# Patient Record
Sex: Female | Born: 1935
Health system: Southern US, Community
[De-identification: ages and names within clinical notes are randomized; demographics above are authoritative.]

## PROBLEM LIST (undated history)

## (undated) DIAGNOSIS — I10 Essential (primary) hypertension: Secondary | ICD-10-CM

## (undated) DIAGNOSIS — N390 Urinary tract infection, site not specified: Secondary | ICD-10-CM

## (undated) DIAGNOSIS — R350 Frequency of micturition: Secondary | ICD-10-CM

## (undated) DIAGNOSIS — R3915 Urgency of urination: Secondary | ICD-10-CM

## (undated) DIAGNOSIS — G309 Alzheimer's disease, unspecified: Secondary | ICD-10-CM

## (undated) DIAGNOSIS — B029 Zoster without complications: Secondary | ICD-10-CM

## (undated) DIAGNOSIS — K4090 Unilateral inguinal hernia, without obstruction or gangrene, not specified as recurrent: Secondary | ICD-10-CM

## (undated) DIAGNOSIS — Z973 Presence of spectacles and contact lenses: Secondary | ICD-10-CM

## (undated) DIAGNOSIS — F0280 Dementia in other diseases classified elsewhere without behavioral disturbance: Secondary | ICD-10-CM

## (undated) DIAGNOSIS — F419 Anxiety disorder, unspecified: Secondary | ICD-10-CM

## (undated) DIAGNOSIS — M1990 Unspecified osteoarthritis, unspecified site: Secondary | ICD-10-CM

## (undated) HISTORY — DX: Alzheimer's disease, unspecified: G30.9

## (undated) HISTORY — DX: Anxiety disorder, unspecified: F41.9

## (undated) HISTORY — PX: BUNIONECTOMY: SHX129

## (undated) HISTORY — DX: Dementia in other diseases classified elsewhere without behavioral disturbance: F02.80

---

## 2001-03-09 ENCOUNTER — Other Ambulatory Visit: Admission: RE | Admit: 2001-03-09 | Discharge: 2001-03-09 | Payer: Self-pay | Admitting: Internal Medicine

## 2003-06-27 ENCOUNTER — Other Ambulatory Visit: Admission: RE | Admit: 2003-06-27 | Discharge: 2003-06-27 | Payer: Self-pay | Admitting: Internal Medicine

## 2006-06-09 ENCOUNTER — Other Ambulatory Visit: Admission: RE | Admit: 2006-06-09 | Discharge: 2006-06-09 | Payer: Self-pay | Admitting: Internal Medicine

## 2008-09-29 ENCOUNTER — Emergency Department (HOSPITAL_COMMUNITY): Admission: EM | Admit: 2008-09-29 | Discharge: 2008-09-29 | Payer: Self-pay | Admitting: Family Medicine

## 2011-08-30 LAB — POCT URINALYSIS DIP (DEVICE)
Bilirubin Urine: NEGATIVE
Nitrite: NEGATIVE
Operator id: 20094
Protein, ur: NEGATIVE
Specific Gravity, Urine: 1.01
pH: 7

## 2013-04-28 HISTORY — PX: EYE SURGERY: SHX253

## 2013-07-03 ENCOUNTER — Other Ambulatory Visit: Payer: Self-pay | Admitting: Orthopaedic Surgery

## 2013-07-03 DIAGNOSIS — R937 Abnormal findings on diagnostic imaging of other parts of musculoskeletal system: Secondary | ICD-10-CM

## 2013-07-08 ENCOUNTER — Ambulatory Visit
Admission: RE | Admit: 2013-07-08 | Discharge: 2013-07-08 | Disposition: A | Discharge status: Home / Self Care | Payer: Medicare Other | Source: Ambulatory Visit | Attending: Orthopaedic Surgery | Admitting: Orthopaedic Surgery

## 2013-07-08 DIAGNOSIS — R937 Abnormal findings on diagnostic imaging of other parts of musculoskeletal system: Secondary | ICD-10-CM

## 2013-10-03 ENCOUNTER — Encounter (HOSPITAL_COMMUNITY): Payer: Self-pay | Admitting: Pharmacy Technician

## 2013-10-04 ENCOUNTER — Other Ambulatory Visit (HOSPITAL_COMMUNITY): Payer: Self-pay | Admitting: Orthopaedic Surgery

## 2013-10-10 ENCOUNTER — Ambulatory Visit (HOSPITAL_COMMUNITY)
Admission: RE | Admit: 2013-10-10 | Discharge: 2013-10-10 | Disposition: A | Discharge status: Home / Self Care | Payer: Medicare Other | Source: Ambulatory Visit | Attending: Orthopaedic Surgery | Admitting: Orthopaedic Surgery

## 2013-10-10 ENCOUNTER — Encounter (HOSPITAL_COMMUNITY): Payer: Self-pay

## 2013-10-10 ENCOUNTER — Encounter (HOSPITAL_COMMUNITY)
Admission: RE | Admit: 2013-10-10 | Discharge: 2013-10-10 | Disposition: A | Discharge status: Home / Self Care | Payer: Medicare Other | Source: Ambulatory Visit | Attending: Orthopaedic Surgery | Admitting: Orthopaedic Surgery

## 2013-10-10 DIAGNOSIS — Z01818 Encounter for other preprocedural examination: Secondary | ICD-10-CM | POA: Insufficient documentation

## 2013-10-10 DIAGNOSIS — I10 Essential (primary) hypertension: Secondary | ICD-10-CM | POA: Insufficient documentation

## 2013-10-10 DIAGNOSIS — I517 Cardiomegaly: Secondary | ICD-10-CM | POA: Insufficient documentation

## 2013-10-10 HISTORY — DX: Essential (primary) hypertension: I10

## 2013-10-10 HISTORY — DX: Urinary tract infection, site not specified: N39.0

## 2013-10-10 HISTORY — DX: Frequency of micturition: R35.0

## 2013-10-10 HISTORY — DX: Unspecified osteoarthritis, unspecified site: M19.90

## 2013-10-10 HISTORY — DX: Urgency of urination: R39.15

## 2013-10-10 HISTORY — DX: Zoster without complications: B02.9

## 2013-10-10 LAB — COMPREHENSIVE METABOLIC PANEL
BUN: 11 mg/dL (ref 6–23)
CO2: 26 mEq/L (ref 19–32)
Creatinine, Ser: 0.49 mg/dL — ABNORMAL LOW (ref 0.50–1.10)
GFR calc non Af Amer: >90 mL/min (ref >90)

## 2013-10-10 LAB — CBC
Hemoglobin: 15.1 g/dL — ABNORMAL HIGH (ref 12.0–15.0)
MCHC: 36 g/dL (ref 30.0–36.0)
MCV: 97.2 fL (ref 78.0–100.0)
Platelets: 267 10*3/uL (ref 150–400)

## 2013-10-10 NOTE — Progress Notes (Signed)
Cardiologist is Dr. Scarlette Calico with last visit 5-6 years ago, released unless needs to be seen.  ECHO done 6 years ago.  Pt reports being seen by Everardo Beals, NP at [facility].  Pt denies CXR in the last year and cannot remember where she had EKG done about 6 months ago prior to cataract surgery.

## 2013-10-10 NOTE — Pre-Procedure Instructions (Signed)
JONIE BURDELL  10/10/2013   Your procedure is scheduled on:  Mon, Nov 17 @ 2:57 PM  Report to [facility] Short Stay Entrance A at 1:00 PM.  Call this number if you have problems the morning of surgery: [phone]   Remember:   Do not eat food or drink liquids after midnight.   Take these medicines the morning of surgery with A SIP OF WATER: Eye Drops               Stop taking your Fish Oil and Aspirin.No Goody's,BC's,Aleve,Ibuprofen,or any Herbal Medications   Do not wear jewelry, make-up or nail polish.  Do not wear lotions, powders, or perfumes. You may wear deodorant.  Do not shave 48 hours prior to surgery.   Do not bring valuables to the hospital.  [facility] is not responsible                  for any belongings or valuables.               Contacts, dentures or bridgework may not be worn into surgery.  Leave suitcase in the car. After surgery it may be brought to your room.  For patients admitted to the hospital, discharge time is determined by your                treatment team.               Patients discharged the day of surgery will not be allowed to drive  home.    Special Instructions: Shower using CHG 2 nights before surgery and the night before surgery.  If you shower the day of surgery use CHG.  Use special wash - you have one bottle of CHG for all showers.  You should use approximately 1/3 of the bottle for each shower.   Please read over the following fact sheets that you were given: Pain Booklet, Coughing and Deep Breathing, MRSA Information and Surgical Site Infection Prevention

## 2013-10-11 ENCOUNTER — Encounter (HOSPITAL_COMMUNITY): Payer: Self-pay

## 2013-10-11 NOTE — H&P (Signed)
  [facility]   A Division of [employer]   [address], [place], [place] [address] Telephone: [phone]  Fax: [phone]     PATIENT: Alicia [name], Alicia [name]   MR#: [id number]  DOB: 24-Jul-1936       HISTORY OF PRESENT ILLNESS:  A 77 year old female referred by Dr. Ernestina Patches for severe spinal stenosis of the L4-5 level with grade 1 anterolisthesis.  She has had ongoing progressing pain, had epidurals with only temporary relief.  She has worse right than left leg pain.  She can only lay on her back at night and at the end of the day she is having significant increasing pain and has had MRI scan in May that showed severe central stenosis with facet arthropathy and synovial cyst coming off the right anterior facet joint which was intraspinal and extradural contributing to the stenosis.  Her stenosis is multifactorial.  She has some severe facet changes at L5-S1 with patent foramina and lateral recess.  The patient is using vitamins and Motrin.  She is normally followed by [facility].     PAST SURGICAL HISTORY:  She had previous hammer toes, bunion surgery in 2011.   SOCIAL HISTORY:  She is married to her husband [name] who is with her today.  She retired last year.   FAMILY HISTORY:  Positive for heart disease.   REVIEW OF SYSTEMS:  14-point review of systems negative for rheumatologic disease, cardiac history.   Epidural's have been done in June and also July without sustained relief.     RADIOGRAPHS/TEST:  We reviewed the MRI scan with her and discussed severe stenosis.  Flexion/extension x-rays, lumbar lateral demonstrates a 6.4 mm of anterolisthesis with degenerative facet.  She has intact pars.  There is mild lumbar curvature less than 15 degrees.  Since she is not subluxing more than 3 mm we discussed that she would be a candidate for decompression, removal of the extradural intraspinal cyst with biforaminal decompression.       ASSESSMENT/DIAGNOSIS:  Central decompression at L4-5.  Overnight stay in the hospital, risk of dural tear, reoperation, potential for progression of the anterolisthesis which might require later fusion discussed.     PLAN:  All questions answered.  She understands and requests we proceed.   For additional information please see handwritten notes, reports, orders and prescriptions in this chart.      Mark C. Lorin Mercy, M.D.    Auto-Authenticated by Thana Farr. Lorin Mercy, M.D.

## 2013-10-11 NOTE — Progress Notes (Signed)
Anesthesia Chart Review:  Patient is a L4-5 decompression, excision extradural intraspinal cyst on 10/14/13 by Dr. Lorin Mercy.  History includes non-smoker, frequent UTI, HTN, arthritis, left cataract extraction 04/2013. PCP is listed as Everardo Beals, NP [facility].  She was evaluated by cardiologist Dr. Irish Lack 5-6 years ago, but was reportedly released with PRN cardiology follow-up.  Meds: ASA, Calcium, Vitamin D, Vitamin B12, lisinopril, Magnesium, Fish Oil, Refresh gtts.  EKG on 10/10/13 showed NSR, non-specific ST abnormality.   CXR on 10/10/13 showed borderline cardiomegaly, without acute disease.  Preoperative labs noted.  Na 127, Cl 89, Cr 0.49, H/H 15.1/41.9, PT/INR WNL.  Glucose 94.  I will order an ISTAT on the day of surgery to ensure Na is stable and/or improved preoperatively.  She is not on diuretic therapy.  George Hugh [facility] Short Stay Center/Anesthesiology Phone [phone] 10/11/2013 10:57 AM

## 2013-10-13 MED ORDER — CEFAZOLIN SODIUM-DEXTROSE 2-3 GM-% IV SOLR: 2.0000 g | INTRAVENOUS | Status: DC

## 2013-10-14 ENCOUNTER — Inpatient Hospital Stay (HOSPITAL_COMMUNITY): Payer: Medicare Other | Admitting: Certified Registered"

## 2013-10-14 ENCOUNTER — Inpatient Hospital Stay (HOSPITAL_COMMUNITY)
Admission: RE | Admit: 2013-10-14 | Discharge: 2013-10-15 | DRG: 520 | Disposition: A | Discharge status: Home / Self Care | Payer: Medicare Other | Source: Ambulatory Visit | Attending: Orthopaedic Surgery | Admitting: Orthopaedic Surgery

## 2013-10-14 ENCOUNTER — Encounter (HOSPITAL_COMMUNITY): Payer: Medicare Other | Admitting: Vascular Surgery

## 2013-10-14 ENCOUNTER — Encounter (HOSPITAL_COMMUNITY)
Admission: RE | Disposition: A | Discharge status: Home / Self Care | Payer: Self-pay | Source: Ambulatory Visit | Attending: Orthopaedic Surgery

## 2013-10-14 ENCOUNTER — Inpatient Hospital Stay (HOSPITAL_COMMUNITY): Payer: Medicare Other

## 2013-10-14 ENCOUNTER — Encounter (HOSPITAL_COMMUNITY): Payer: Self-pay | Admitting: *Deleted

## 2013-10-14 DIAGNOSIS — Q762 Congenital spondylolisthesis: Secondary | ICD-10-CM

## 2013-10-14 DIAGNOSIS — M48062 Spinal stenosis, lumbar region with neurogenic claudication: Secondary | ICD-10-CM | POA: Diagnosis present

## 2013-10-14 DIAGNOSIS — I10 Essential (primary) hypertension: Secondary | ICD-10-CM | POA: Diagnosis present

## 2013-10-14 DIAGNOSIS — M7130 Other bursal cyst, unspecified site: Principal | ICD-10-CM | POA: Diagnosis present

## 2013-10-14 DIAGNOSIS — Z8249 Family history of ischemic heart disease and other diseases of the circulatory system: Secondary | ICD-10-CM

## 2013-10-14 HISTORY — PX: LUMBAR LAMINECTOMY: SHX95

## 2013-10-14 LAB — POCT I-STAT 4, (NA,K, GLUC, HGB,HCT)
Glucose, Bld: 102 mg/dL — ABNORMAL HIGH (ref 70–99)
HCT: 41 % (ref 36.0–46.0)
Potassium: 4 mEq/L (ref 3.5–5.1)

## 2013-10-14 SURGERY — MICRODISCECTOMY LUMBAR LAMINECTOMY
Anesthesia: General | Site: Back | Wound class: Clean

## 2013-10-14 MED ORDER — SODIUM CHLORIDE 0.9 % IJ SOLN
3.0000 mL | Freq: Two times a day (BID) | INTRAMUSCULAR | Status: DC
  Administered 2013-10-15: 3 mL via INTRAVENOUS

## 2013-10-14 MED ORDER — ROCURONIUM BROMIDE 100 MG/10ML IV SOLN
INTRAVENOUS | Status: DC | PRN
  Administered 2013-10-14: 50 mg via INTRAVENOUS

## 2013-10-14 MED ORDER — ACETAMINOPHEN 325 MG PO TABS: 650.0000 mg | ORAL_TABLET | ORAL | Status: DC | PRN

## 2013-10-14 MED ORDER — PROPOFOL 10 MG/ML IV BOLUS
INTRAVENOUS | Status: DC | PRN
  Administered 2013-10-14: 200 mg via INTRAVENOUS

## 2013-10-14 MED ORDER — KCL IN DEXTROSE-NACL 20-5-0.45 MEQ/L-%-% IV SOLN
INTRAVENOUS | Status: DC
  Filled 2013-10-14 (×3): qty 1000

## 2013-10-14 MED ORDER — CEFAZOLIN SODIUM 1-5 GM-% IV SOLN
1.0000 g | Freq: Once | INTRAVENOUS | Status: AC
  Administered 2013-10-14: 1 g via INTRAVENOUS
  Filled 2013-10-14: qty 50

## 2013-10-14 MED ORDER — POLYVINYL ALCOHOL 1.4 % OP SOLN
1.0000 [drp] | Freq: Every day | OPHTHALMIC | Status: DC
  Filled 2013-10-14 (×2): qty 15

## 2013-10-14 MED ORDER — LACTATED RINGERS IV SOLN
INTRAVENOUS | Status: DC
  Administered 2013-10-14: 13:00:00 via INTRAVENOUS

## 2013-10-14 MED ORDER — PHENOL 1.4 % MT LIQD: 1.0000 | OROMUCOSAL | Status: DC | PRN

## 2013-10-14 MED ORDER — METHOCARBAMOL 100 MG/ML IJ SOLN
500.0000 mg | Freq: Four times a day (QID) | INTRAVENOUS | Status: DC | PRN
  Filled 2013-10-14: qty 5

## 2013-10-14 MED ORDER — DEXAMETHASONE SODIUM PHOSPHATE 10 MG/ML IJ SOLN
INTRAMUSCULAR | Status: DC | PRN
  Administered 2013-10-14: 4 mg via INTRAVENOUS

## 2013-10-14 MED ORDER — CARBOXYMETHYLCELLULOSE SODIUM 1 % OP SOLN: 1.0000 [drp] | Freq: Every day | OPHTHALMIC | Status: DC

## 2013-10-14 MED ORDER — SODIUM CHLORIDE 0.9 % IV SOLN: 250.0000 mL | INTRAVENOUS | Status: DC

## 2013-10-14 MED ORDER — OXYCODONE-ACETAMINOPHEN 5-325 MG PO TABS
1.0000 | ORAL_TABLET | ORAL | Status: DC | PRN
  Administered 2013-10-15: 1 via ORAL
  Administered 2013-10-15: 2 via ORAL
  Filled 2013-10-14: qty 2
  Filled 2013-10-14: qty 1

## 2013-10-14 MED ORDER — MENTHOL 3 MG MT LOZG: 1.0000 | LOZENGE | OROMUCOSAL | Status: DC | PRN

## 2013-10-14 MED ORDER — KETOROLAC TROMETHAMINE 30 MG/ML IJ SOLN
30.0000 mg | Freq: Once | INTRAMUSCULAR | Status: AC
  Administered 2013-10-14: 30 mg via INTRAVENOUS
  Filled 2013-10-14: qty 1

## 2013-10-14 MED ORDER — ASPIRIN EC 81 MG PO TBEC
81.0000 mg | DELAYED_RELEASE_TABLET | Freq: Every day | ORAL | Status: DC
  Administered 2013-10-14 – 2013-10-15 (×2): 81 mg via ORAL
  Filled 2013-10-14 (×2): qty 1

## 2013-10-14 MED ORDER — ONDANSETRON HCL 4 MG/2ML IJ SOLN
INTRAMUSCULAR | Status: DC | PRN
  Administered 2013-10-14: 4 mg via INTRAVENOUS

## 2013-10-14 MED ORDER — ZOLPIDEM TARTRATE 5 MG PO TABS: 5.0000 mg | ORAL_TABLET | Freq: Every evening | ORAL | Status: DC | PRN

## 2013-10-14 MED ORDER — LISINOPRIL 20 MG PO TABS
20.0000 mg | ORAL_TABLET | Freq: Every day | ORAL | Status: DC
  Administered 2013-10-14 – 2013-10-15 (×2): 20 mg via ORAL
  Filled 2013-10-14 (×2): qty 1

## 2013-10-14 MED ORDER — MENTHOL 3 MG MT LOZG
LOZENGE | OROMUCOSAL | Status: AC
  Filled 2013-10-14: qty 9

## 2013-10-14 MED ORDER — ONDANSETRON HCL 4 MG/2ML IJ SOLN: 4.0000 mg | INTRAMUSCULAR | Status: DC | PRN

## 2013-10-14 MED ORDER — CEFAZOLIN SODIUM-DEXTROSE 2-3 GM-% IV SOLR
INTRAVENOUS | Status: AC
  Administered 2013-10-14: 2 g via INTRAVENOUS
  Filled 2013-10-14: qty 50

## 2013-10-14 MED ORDER — LIDOCAINE HCL (CARDIAC) 20 MG/ML IV SOLN
INTRAVENOUS | Status: DC | PRN
  Administered 2013-10-14: 60 mg via INTRAVENOUS

## 2013-10-14 MED ORDER — FENTANYL CITRATE 0.05 MG/ML IJ SOLN
INTRAMUSCULAR | Status: DC | PRN
  Administered 2013-10-14 (×6): 50 ug via INTRAVENOUS

## 2013-10-14 MED ORDER — NEOSTIGMINE METHYLSULFATE 1 MG/ML IJ SOLN
INTRAMUSCULAR | Status: DC | PRN
  Administered 2013-10-14: 4 mg via INTRAVENOUS

## 2013-10-14 MED ORDER — PANTOPRAZOLE SODIUM 40 MG IV SOLR
40.0000 mg | Freq: Every day | INTRAVENOUS | Status: DC
  Administered 2013-10-14: 40 mg via INTRAVENOUS
  Filled 2013-10-14 (×3): qty 40

## 2013-10-14 MED ORDER — PHENYLEPHRINE HCL 10 MG/ML IJ SOLN
10.0000 mg | INTRAVENOUS | Status: DC | PRN
  Administered 2013-10-14: 25 ug/min via INTRAVENOUS

## 2013-10-14 MED ORDER — 0.9 % SODIUM CHLORIDE (POUR BTL) OPTIME
TOPICAL | Status: DC | PRN
  Administered 2013-10-14: 1000 mL

## 2013-10-14 MED ORDER — PHENYLEPHRINE HCL 10 MG/ML IJ SOLN
INTRAMUSCULAR | Status: DC | PRN
  Administered 2013-10-14 (×2): 40 ug via INTRAVENOUS

## 2013-10-14 MED ORDER — KETOROLAC TROMETHAMINE 30 MG/ML IJ SOLN: 15.0000 mg | Freq: Once | INTRAMUSCULAR | Status: DC | PRN

## 2013-10-14 MED ORDER — FLEET ENEMA 7-19 GM/118ML RE ENEM: 1.0000 | ENEMA | Freq: Once | RECTAL | Status: AC | PRN

## 2013-10-14 MED ORDER — DOCUSATE SODIUM 100 MG PO CAPS
100.0000 mg | ORAL_CAPSULE | Freq: Two times a day (BID) | ORAL | Status: DC
  Administered 2013-10-14 – 2013-10-15 (×2): 100 mg via ORAL
  Filled 2013-10-14 (×3): qty 1

## 2013-10-14 MED ORDER — SODIUM CHLORIDE 0.9 % IJ SOLN: 3.0000 mL | INTRAMUSCULAR | Status: DC | PRN

## 2013-10-14 MED ORDER — HYDROCODONE-ACETAMINOPHEN 5-325 MG PO TABS: 1.0000 | ORAL_TABLET | ORAL | Status: DC | PRN

## 2013-10-14 MED ORDER — ACETAMINOPHEN 650 MG RE SUPP: 650.0000 mg | RECTAL | Status: DC | PRN

## 2013-10-14 MED ORDER — BISACODYL 10 MG RE SUPP: 10.0000 mg | Freq: Every day | RECTAL | Status: DC | PRN

## 2013-10-14 MED ORDER — SENNOSIDES-DOCUSATE SODIUM 8.6-50 MG PO TABS: 1.0000 | ORAL_TABLET | Freq: Every evening | ORAL | Status: DC | PRN

## 2013-10-14 MED ORDER — GLYCOPYRROLATE 0.2 MG/ML IJ SOLN
INTRAMUSCULAR | Status: DC | PRN
  Administered 2013-10-14: 0.6 mg via INTRAVENOUS

## 2013-10-14 MED ORDER — METHOCARBAMOL 500 MG PO TABS: 500.0000 mg | ORAL_TABLET | Freq: Four times a day (QID) | ORAL | Status: DC | PRN

## 2013-10-14 MED ORDER — ONDANSETRON HCL 4 MG/2ML IJ SOLN: 4.0000 mg | Freq: Once | INTRAMUSCULAR | Status: DC | PRN

## 2013-10-14 MED ORDER — BUPIVACAINE HCL (PF) 0.25 % IJ SOLN
INTRAMUSCULAR | Status: AC
  Filled 2013-10-14: qty 30

## 2013-10-14 MED ORDER — HYDROMORPHONE HCL PF 1 MG/ML IJ SOLN: 0.2500 mg | INTRAMUSCULAR | Status: DC | PRN

## 2013-10-14 MED ORDER — MIDAZOLAM HCL 5 MG/5ML IJ SOLN
INTRAMUSCULAR | Status: DC | PRN
  Administered 2013-10-14: 2 mg via INTRAVENOUS

## 2013-10-14 MED ORDER — MORPHINE SULFATE 2 MG/ML IJ SOLN: 1.0000 mg | INTRAMUSCULAR | Status: DC | PRN

## 2013-10-14 MED ORDER — LACTATED RINGERS IV SOLN
INTRAVENOUS | Status: DC | PRN
  Administered 2013-10-14 (×2): via INTRAVENOUS

## 2013-10-14 SURGICAL SUPPLY — 54 items
BUR ROUND FLUTED 4 SOFT TCH (BURR) IMPLANT
CLOTH BEACON ORANGE TIMEOUT ST (SAFETY) ×2 IMPLANT
CORDS BIPOLAR (ELECTRODE) ×2 IMPLANT
COVER SURGICAL LIGHT HANDLE (MISCELLANEOUS) ×2 IMPLANT
DERMABOND ADVANCED (GAUZE/BANDAGES/DRESSINGS) ×1
DERMABOND ADVANCED .7 DNX12 (GAUZE/BANDAGES/DRESSINGS) ×1 IMPLANT
DRAPE MICROSCOPE LEICA (MISCELLANEOUS) ×2 IMPLANT
DRAPE PROXIMA HALF (DRAPES) ×4 IMPLANT
DRSG EMULSION OIL 3X3 NADH (GAUZE/BANDAGES/DRESSINGS) ×2 IMPLANT
DRSG MEPILEX BORDER 4X4 (GAUZE/BANDAGES/DRESSINGS) ×2 IMPLANT
DRSG MEPILEX BORDER 4X8 (GAUZE/BANDAGES/DRESSINGS) IMPLANT
DURAPREP 26ML APPLICATOR (WOUND CARE) ×2 IMPLANT
ELECT BLADE 4.0 EZ CLEAN MEGAD (MISCELLANEOUS) ×2
ELECT REM PT RETURN 9FT ADLT (ELECTROSURGICAL) ×2
ELECTRODE BLDE 4.0 EZ CLN MEGD (MISCELLANEOUS) ×1 IMPLANT
ELECTRODE REM PT RTRN 9FT ADLT (ELECTROSURGICAL) ×1 IMPLANT
GLOVE BIO SURGEON STRL SZ7 (GLOVE) ×4 IMPLANT
GLOVE BIOGEL PI IND STRL 7.0 (GLOVE) ×2 IMPLANT
GLOVE BIOGEL PI IND STRL 7.5 (GLOVE) ×1 IMPLANT
GLOVE BIOGEL PI IND STRL 8 (GLOVE) ×1 IMPLANT
GLOVE BIOGEL PI INDICATOR 7.0 (GLOVE) ×2
GLOVE BIOGEL PI INDICATOR 7.5 (GLOVE) ×1
GLOVE BIOGEL PI INDICATOR 8 (GLOVE) ×1
GLOVE ECLIPSE 7.0 STRL STRAW (GLOVE) ×2 IMPLANT
GLOVE ORTHO TXT STRL SZ7.5 (GLOVE) ×2 IMPLANT
GOWN PREVENTION PLUS LG XLONG (DISPOSABLE) ×2 IMPLANT
GOWN STRL NON-REIN LRG LVL3 (GOWN DISPOSABLE) IMPLANT
GOWN STRL REIN XL XLG (GOWN DISPOSABLE) ×4 IMPLANT
KIT BASIN OR (CUSTOM PROCEDURE TRAY) ×2 IMPLANT
KIT ROOM TURNOVER OR (KITS) ×2 IMPLANT
MANIFOLD NEPTUNE II (INSTRUMENTS) ×2 IMPLANT
NDL SUT .5 MAYO 1.404X.05X (NEEDLE) ×1 IMPLANT
NEEDLE 22X1 1/2 (OR ONLY) (NEEDLE) ×2 IMPLANT
NEEDLE MAYO TAPER (NEEDLE) ×1
NEEDLE SPNL 18GX3.5 QUINCKE PK (NEEDLE) ×2 IMPLANT
NS IRRIG 1000ML POUR BTL (IV SOLUTION) ×2 IMPLANT
PACK LAMINECTOMY ORTHO (CUSTOM PROCEDURE TRAY) ×2 IMPLANT
PAD ARMBOARD 7.5X6 YLW CONV (MISCELLANEOUS) ×4 IMPLANT
PATTIES SURGICAL .5 X.5 (GAUZE/BANDAGES/DRESSINGS) ×2 IMPLANT
PATTIES SURGICAL .75X.75 (GAUZE/BANDAGES/DRESSINGS) ×2 IMPLANT
SPONGE GAUZE 4X4 12PLY (GAUZE/BANDAGES/DRESSINGS) IMPLANT
SUT VIC AB 0 CT1 27 (SUTURE) ×1
SUT VIC AB 0 CT1 27XBRD ANBCTR (SUTURE) ×1 IMPLANT
SUT VIC AB 2-0 CT1 27 (SUTURE) ×1
SUT VIC AB 2-0 CT1 TAPERPNT 27 (SUTURE) ×1 IMPLANT
SUT VICRYL 0 TIES 12 18 (SUTURE) ×2 IMPLANT
SUT VICRYL 0 UR6 27IN ABS (SUTURE) ×2 IMPLANT
SUT VICRYL 4-0 PS2 18IN ABS (SUTURE) ×2 IMPLANT
SUT VICRYL AB 2 0 TIES (SUTURE) ×2 IMPLANT
SYR 20ML ECCENTRIC (SYRINGE) IMPLANT
SYR CONTROL 10ML LL (SYRINGE) ×2 IMPLANT
TOWEL OR 17X24 6PK STRL BLUE (TOWEL DISPOSABLE) ×2 IMPLANT
TOWEL OR 17X26 10 PK STRL BLUE (TOWEL DISPOSABLE) ×2 IMPLANT
WATER STERILE IRR 1000ML POUR (IV SOLUTION) IMPLANT

## 2013-10-14 NOTE — Anesthesia Procedure Notes (Addendum)
Procedure Name: Intubation Date/Time: 10/14/2013 3:10 PM Performed by: Maeola Harman Pre-anesthesia Checklist: Patient identified, Emergency Drugs available, Suction available, Patient being monitored and Timeout performed Patient Re-evaluated:Patient Re-evaluated prior to inductionOxygen Delivery Method: Circle system utilized Preoxygenation: Pre-oxygenation with 100% oxygen Intubation Type: IV induction and Cricoid Pressure applied Ventilation: Mask ventilation without difficulty and Oral airway inserted - appropriate to patient size Laryngoscope Size: Sabra Heck and 2 Grade View: Grade III Tube type: Oral Tube size: 7.5 mm Number of attempts: 2 Airway Equipment and Method: Stylet Placement Confirmation: ETT inserted through vocal cords under direct vision,  positive ETCO2 and breath sounds checked- equal and bilateral Secured at: 22 cm Tube secured with: Tape Dental Injury: Teeth and Oropharynx as per pre-operative assessment  Comments: Easy induction and mask with oral airway in place.  DL attempted x 1 with MAC 3 blade by paramedic student Shawn under the direction of Dr. Linna Caprice with view of only the epiglottis.  DLx 1 by Dr. Linna Caprice with Sabra Heck 2 blade.  ETT passed easily through cords and Dr. Linna Caprice verified placement.  Waldron Session, CRNA

## 2013-10-14 NOTE — Transfer of Care (Signed)
Immediate Anesthesia Transfer of Care Note  Patient: Alicia [name]  Procedure(s) Performed: Procedure(s) with comments: MICRODISCECTOMY LUMBAR LAMINECTOMY/L4-5 Decompression, Excision Extradural Intraspinal Cyst (N/A) - L4-5 Decompression, Excision Extradural Intraspinal Cyst  Patient Location: PACU  Anesthesia Type:General  Level of Consciousness: awake, alert  and oriented  Airway & Oxygen Therapy: Patient connected to [name] mask oxygen  Post-op Assessment: Report given to PACU RN  Post vital signs: stable  Complications: No apparent anesthesia complications

## 2013-10-14 NOTE — Interval H&P Note (Signed)
History and Physical Interval Note:  10/14/2013 2:34 PM  Alicia [name]  has presented today for surgery, with the diagnosis of Severe L4-5 Stenosis, Intrapinal extradural cyst  The various methods of treatment have been discussed with the patient and family. After consideration of risks, benefits and other options for treatment, the patient has consented to  Procedure(s) with comments: MICRODISCECTOMY LUMBAR LAMINECTOMY/L4-5 Decompression, Excision Extradural Intraspinal Cyst (N/A) - L4-5 Decompression, Excision Extradural Intraspinal Cyst as a surgical intervention .  The patient's history has been reviewed, patient examined, no change in status, stable for surgery.  I have reviewed the patient's chart and labs.  Questions were answered to the patient's satisfaction.     Perrin Gens C

## 2013-10-14 NOTE — Anesthesia Postprocedure Evaluation (Signed)
  Anesthesia Post-op Note  Patient: Alicia [name]  Procedure(s) Performed: Procedure(s) with comments: MICRODISCECTOMY LUMBAR LAMINECTOMY/L4-5 Decompression, Excision Extradural Intraspinal Cyst (N/A) - L4-5 Decompression, Excision Extradural Intraspinal Cyst  Patient Location: PACU  Anesthesia Type:General  Level of Consciousness: awake and alert   Airway and Oxygen Therapy: Patient Spontanous Breathing  Post-op Pain: none  Post-op Assessment: Post-op Vital signs reviewed, Patient's Cardiovascular Status Stable, Respiratory Function Stable and No signs of Nausea or vomiting  Post-op Vital Signs: Reviewed and stable  Complications: No apparent anesthesia complications

## 2013-10-14 NOTE — Anesthesia Preprocedure Evaluation (Addendum)
Anesthesia Evaluation  Patient identified by MRN, date of birth, ID band Patient awake    Reviewed: Allergy & Precautions, H&P , NPO status , Patient's Chart, lab work & pertinent test results, reviewed documented beta blocker date and time   Airway Mallampati: II TM Distance: >3 FB Neck ROM: Full    Dental  (+) Teeth Intact and Dental Advisory Given   Pulmonary neg pulmonary ROS,  breath sounds clear to auscultation        Cardiovascular hypertension, Pt. on medications Rhythm:Regular Rate:Normal     Neuro/Psych negative neurological ROS  negative psych ROS   GI/Hepatic negative GI ROS, Neg liver ROS,   Endo/Other  negative endocrine ROS  Renal/GU negative Renal ROS  negative genitourinary   Musculoskeletal negative musculoskeletal ROS (+)   Abdominal (+)  Abdomen: soft. Bowel sounds: normal.  Peds  Hematology negative hematology ROS (+)   Anesthesia Other Findings   Reproductive/Obstetrics negative OB ROS                         Anesthesia Physical Anesthesia Plan  ASA: II  Anesthesia Plan: General   Post-op Pain Management:    Induction: Intravenous  Airway Management Planned: Oral ETT  Additional Equipment:   Intra-op Plan:   Post-operative Plan: Extubation in OR  Informed Consent: I have reviewed the patients History and Physical, chart, labs and discussed the procedure including the risks, benefits and alternatives for the proposed anesthesia with the patient or authorized representative who has indicated his/her understanding and acceptance.   Dental advisory given  Plan Discussed with: CRNA and Anesthesiologist  Anesthesia Plan Comments: (L4-5 stenosis Htn  Plan GA with oral ETT  Roberts Gaudy, MD)        Anesthesia Quick Evaluation

## 2013-10-14 NOTE — Anesthesia Postprocedure Evaluation (Signed)
  Anesthesia Post-op Note  Patient: Alicia [name]  Procedure(s) Performed: Procedure(s) with comments: MICRODISCECTOMY LUMBAR LAMINECTOMY/L4-5 Decompression, Excision Extradural Intraspinal Cyst (N/A) - L4-5 Decompression, Excision Extradural Intraspinal Cyst  Patient Location: PACU  Anesthesia Type:General  Level of Consciousness: awake  Airway and Oxygen Therapy: Patient Spontanous Breathing  Post-op Pain: mild  Post-op Assessment: Post-op Vital signs reviewed, Patient's Cardiovascular Status Stable, Respiratory Function Stable, Patent Airway, No signs of Nausea or vomiting and Pain level controlled  Post-op Vital Signs: Reviewed and stable  Complications: No apparent anesthesia complications

## 2013-10-14 NOTE — Brief Op Note (Signed)
10/14/2013  4:49 PM  PATIENT:  Bud Face  77 y.o. female  PRE-OPERATIVE DIAGNOSIS:  Severe L4-5 Stenosis, Intraspinal extradural cyst  POST-OPERATIVE DIAGNOSIS:  Severe L4-5 Stenosis, Intraspinal extradural cyst  PROCEDURE:  Procedure(s) with comments: MICRODISCECTOMY LUMBAR LAMINECTOMY/L4-5 Decompression, Excision Extradural Intraspinal Cyst (N/A) - L4-5 Decompression, Excision Extradural Intraspinal Cyst  SURGEON:  Surgeon(s) and Role:    * Marybelle Killings, MD - Primary  PHYSICIAN ASSISTANT: Phillips Hay [facility]  ASSISTANTS: none   ANESTHESIA:   general  EBL:  Total I/O In: 1000 [I.V.:1000] Out: 100 [Blood:100]  BLOOD ADMINISTERED:none  DRAINS: none   LOCAL MEDICATIONS USED:  NONE  SPECIMEN:  No Specimen  DISPOSITION OF SPECIMEN:  N/A  COUNTS:  YES  TOURNIQUET:  * No tourniquets in log *  DICTATION: .Note written in EPIC  PLAN OF CARE: Admit for overnight observation  PATIENT DISPOSITION:  PACU - hemodynamically stable.   Delay start of Pharmacological VTE agent (>24hrs) due to surgical blood loss or risk of bleeding: yes

## 2013-10-15 MED ORDER — HYDROCODONE-ACETAMINOPHEN 5-325 MG PO TABS: 1.0000 | ORAL_TABLET | ORAL | Status: DC | PRN

## 2013-10-15 NOTE — Progress Notes (Signed)
UR COMPLETED  

## 2013-10-15 NOTE — Progress Notes (Signed)
Subjective: 1 Day Post-Op Procedure(s) (LRB): MICRODISCECTOMY LUMBAR LAMINECTOMY/L4-5 Decompression, Excision Extradural Intraspinal Cyst (N/A) Patient reports pain as mild.  Feels better than pre op Walking in room and leg pain resolved.  Minimal back pain.  Voiding well.  No nausea. Ready to go home  Objective: Vital signs in last 24 hours: Temp:  [97.9 F (36.6 C)-98.3 F (36.8 C)] 97.9 F (36.6 C) (11/18 0552) Pulse Rate:  [59-85] 61 (11/18 0552) Resp:  [11-20] 18 (11/18 0552) BP: (134-170)/(72-107) 134/75 mmHg (11/18 0552) SpO2:  [94 %-100 %] 98 % (11/18 0552)  Intake/Output from previous day: 11/17 0701 - 11/18 0700 In: 2030 [P.O.:120; I.V.:1910] Out: 100 [Blood:100] Intake/Output this shift:     Recent Labs  10/14/13 1321  HGB 13.9    Recent Labs  10/14/13 1321  HCT 41.0    Recent Labs  10/14/13 1321  NA 134*  K 4.0  GLUCOSE 102*   No results found for this basename: LABPT, INR,  in the last 72 hours  Neurovascular intact Dorsiflexion/Plantar flexion intact Incision: no drainage  Assessment/Plan: 1 Day Post-Op Procedure(s) (LRB): MICRODISCECTOMY LUMBAR LAMINECTOMY/L4-5 Decompression, Excision Extradural Intraspinal Cyst (N/A) Advance diet D/C IV fluids Discharge home rx vicoden Ov 2 weeks  Markevion Lattin M 10/15/2013, 11:29 AM

## 2013-10-15 NOTE — Op Note (Signed)
NAMECELESTINE, PRIM NO.:  [id number]  MEDICAL RECORD NO.:  [id number]  LOCATION:  5N10C                        FACILITY:  [facility]  PHYSICIAN:  Caydance Kuehnle C. Lorin Mercy, M.D.    DATE OF BIRTH:  06/30/1936  DATE OF PROCEDURE:  10/14/2013 DATE OF DISCHARGE:                              OPERATIVE REPORT   PREOPERATIVE DIAGNOSES:  L4-5 stenosis with intraspinal extradural facet cyst.  Degenerative anterolisthesis, 6 mm with multifactorial lateral recess and severe central stenosis.  POSTOPERATIVE DIAGNOSES:  L4-5 stenosis with intraspinal extradural facet cyst.  Degenerative anterolisthesis, 6 mm with multifactorial lateral recess and severe central stenosis.  PROCEDURE:  L4-5 decompression, microscopic dissection and removal of extradural intraspinal facet cyst.  SURGEON:  Vesper Trant C. Lorin Mercy, M.D.  ASSISTANT:  Phillips Hay, PA-C, medically necessary and present for the entire procedure.  ANESTHESIA:  General.  ESTIMATED BLOOD LOSS:  100 mL.  COMPLICATIONS:  None.  INDICATIONS:  This 77 year old female has had progressive increasing back and primarily right leg symptoms with claudication to the point where she is only able to walk 25-50 feet, can only stand for 2-3 minutes.  An MRI showed large cyst causing compression in addition to 6 mm anterolisthesis that was stable on flexion, extension, degenerative in nature with intact pars.  PROCEDURE IN DETAIL:  After standard prepping and draping with the patient in prone position,  after intubation preoperative Ancef prophylaxis, time-out procedure, the area squared with towels, Betadine, Steri-Drape applied, and laminectomy sheets and drapes.  Based on palpable landmarks, iliac crest, midline incision was made. Subperiosteal dissection, a  2 Kocher clamps were placed on the planned level of decompression.  Cross-table lateral x-ray confirmed that the L4- 5 interspace was between the 2 clamps as planned but the upper  clamp need to be move cephalad slightly to get the upper portion of the lamina where the cyst extended and there were still some thick chunks of ligament causing multifactorial stenosis.  The deepest blades were applied on the self-retaining retractor.  Lamina was thinned.  Spinous process was removed.  Bur was used to thin the lamina, and then the operative microscope was draped, brought in, laminectomy was performed removing L4 and the top portion of L5 both right and left.  Cyst was encountered.  All areas except where the cyst was present running down the left lateral gutter, chunks of ligament were removed decompressing the dura which allowed a little bit more freedom for the dura and then microdissection techniques, using the D'Errico black nerve hook scalpel and a ball-tipped nerve hooks for gradual progression dissection of the cyst off the dura and then removal.  Bone spurs removed on both gutters. This was inspected, palpated, the foramina was tight, there was slight femoral enlargement.  The cyst was sitting directly over the dorsum of the nerve root compressing it toward the midline and ventrally as it exited out into the foramina.  Once lateral recess was decompressed, cyst was removed.  Dura was carefully inspected, was intact, copious irrigation, then standard layered closure with #1 Vicryl deep layer, 2-0 Vicryl subcutaneous tissue, subcuticular closure.  Dermabond postop dressing and transferred to recovery room.  Instrument count and needle count was  correct.     Chamar Broughton C. Lorin Mercy, M.D.     MCY/MEDQ  D:  10/14/2013  T:  10/15/2013  Job:  [id number]

## 2013-10-16 ENCOUNTER — Encounter (HOSPITAL_COMMUNITY): Payer: Self-pay | Admitting: Orthopaedic Surgery

## 2013-10-21 NOTE — Discharge Summary (Signed)
Physician Discharge Summary  Patient ID: Alicia [name] MRN: [id number] DOB/AGE: October 16, 1936 77 y.o.  Admit date: 10/14/2013 Discharge date: 10/15/2013  Admission Diagnoses:  Spinal stenosis, lumbar region, with neurogenic claudication L4-5.   Discharge Diagnoses:  Principal Problem:   Spinal stenosis, lumbar region, with neurogenic claudication L4-5  Past Medical History  Diagnosis Date  . Frequent UTI     over the last year  . Urinary frequency     over the year  . Urinary urgency   . Arthritis   . Hypertension   . Shingles     Surgeries: Procedure(s): MICRODISCECTOMY LUMBAR LAMINECTOMY/L4-5 Decompression, Excision Extradural Intraspinal Cyst on 10/14/2013   Consultants (if any):  none  Discharged Condition: Improved  Hospital Course: Alicia [name] is an 77 y.o. female who was admitted 10/14/2013 with a diagnosis of Spinal stenosis, lumbar region, with neurogenic claudication and went to the operating room on 10/14/2013 and underwent the above named procedures.    She was given perioperative antibiotics:  Anti-infectives   Start     Dose/Rate Route Frequency Ordered Stop   10/14/13 1900  ceFAZolin (ANCEF) IVPB 1 g/50 mL premix     1 g 100 mL/hr over 30 Minutes Intravenous  Once 10/14/13 1853 10/14/13 2031   10/14/13 1238  ceFAZolin (ANCEF) 2-3 GM-% IVPB SOLR    Comments:  Scronce, Trina   : cabinet override      10/14/13 1238 10/14/13 1510   10/13/13 1049  ceFAZolin (ANCEF) IVPB 2 g/50 mL premix  Status:  Discontinued     2 g 100 mL/hr over 30 Minutes Intravenous On call to O.R. 10/13/13 1049 10/14/13 1853    .  She was given sequential compression devices, early ambulation for DVT prophylaxis.  She benefited maximally from the hospital stay and there were no complications.    Recent vital signs:  Filed Vitals:   10/15/13 0552  BP: 134/75  Pulse: 61  Temp: 97.9 F (36.6 C)  Resp: 18    Recent laboratory studies:  Lab Results  Component Value  Date   HGB 13.9 10/14/2013   HGB 15.1* 10/10/2013   Lab Results  Component Value Date   WBC 6.4 10/10/2013   PLT 267 10/10/2013   Lab Results  Component Value Date   INR 0.96 10/10/2013   Lab Results  Component Value Date   NA 134* 10/14/2013   K 4.0 10/14/2013   CL 89* 10/10/2013   CO2 26 10/10/2013   BUN 11 10/10/2013   CREATININE 0.49* 10/10/2013   GLUCOSE 102* 10/14/2013    Discharge Medications:     Medication List         aspirin EC 81 MG tablet  Take 81 mg by mouth daily.     CALCIUM PO  Take 1 tablet by mouth daily.     FISH OIL PO  Take 1 capsule by mouth daily.     HYDROcodone-acetaminophen 5-325 MG per tablet  Commonly known as:  NORCO/VICODIN  Take 1-2 tablets by mouth every 4 (four) hours as needed for moderate pain.     lisinopril 20 MG tablet  Commonly known as:  PRINIVIL,ZESTRIL  Take 20 mg by mouth daily.     MAGNESIUM PO  Take 1 tablet by mouth daily.     REFRESH OP  Place 1 drop into both eyes daily.     VITAMIN B 12 PO  Take 1 tablet by mouth daily.     VITAMIN D PO  Take 1  tablet by mouth daily.        Diagnostic Studies: Dg Chest 2 View  10/10/2013   CLINICAL DATA:  Preop for spinal surgery.  Hypertension.  Nonsmoker.  EXAM: CHEST  2 VIEW  COMPARISON:  None.  FINDINGS: Suspect left carotid atherosclerosis. Midline trachea. Borderline cardiomegaly. Mediastinal contours otherwise within normal limits. No pleural effusion or pneumothorax. Scarring or subsegmental atelectasis at the left lung base.  IMPRESSION: Borderline cardiomegaly, without acute disease.   Electronically Signed   By: Abigail Miyamoto M.D.   On: 10/10/2013 16:15   Dg Lumbar Spine 1 View  10/14/2013   CLINICAL DATA:  Intraoperative localization for spine surgery.  EXAM: LUMBAR SPINE - 1 VIEW  COMPARISON:  Lumbar spine MRI 04/11/2013.  FINDINGS: Lateral lumbar spine film demonstrates surgical instruments marking the L4 and L5 levels.  IMPRESSION: L4 and L5 marked  intraoperatively.   Electronically Signed   By: Kalman Jewels M.D.   On: 10/14/2013 19:12    Disposition: 01-Home or Self Care      Discharge Orders   Future Orders Complete By Expires   Call MD / Call 911  As directed    Comments:     If you experience chest pain or shortness of breath, CALL 911 and be transported to the hospital emergency room.  If you develope a fever above 101 F, pus (white drainage) or increased drainage or redness at the wound, or calf pain, call your surgeon's office.   Constipation Prevention  As directed    Comments:     Drink plenty of fluids.  Prune juice may be helpful.  You may use a stool softener, such as Colace (over the counter) 100 mg twice a day.  Use MiraLax (over the counter) for constipation as needed.   Diet - low sodium heart healthy  As directed    Discharge instructions  As directed    Comments:     No lifting greater than 10 lbs. Avoid bending, stooping and twisting. Walk in house for first week them may start to get out slowly increasing distance up to one mile by 3 weeks post op. Keep incision dry for 3 days, may use tegaderm or similar water impervious dressing. Ice packs as needed.   Increase activity slowly as tolerated  As directed       Follow-up Information   Follow up with YATES,MARK C, MD. Schedule an appointment as soon as possible for a visit in 2 weeks.   Specialty:  Orthopedic Surgery   Contact information:   [place] [address] [phone]        Signed: Epimenio Foot 10/21/2013, 4:10 PM

## 2017-10-06 ENCOUNTER — Other Ambulatory Visit (HOSPITAL_COMMUNITY)
Admit: 2017-10-06 | Discharge: 2017-10-06 | Disposition: A | Discharge status: Home / Self Care | Payer: Medicare Other | Source: Other Acute Inpatient Hospital | Attending: Internal Medicine | Admitting: Internal Medicine

## 2017-10-06 ENCOUNTER — Other Ambulatory Visit: Payer: Self-pay | Admitting: Internal Medicine

## 2017-10-06 ENCOUNTER — Encounter (HOSPITAL_BASED_OUTPATIENT_CLINIC_OR_DEPARTMENT_OTHER): Payer: Medicare Other | Attending: Internal Medicine

## 2017-10-06 DIAGNOSIS — X58XXXA Exposure to other specified factors, initial encounter: Secondary | ICD-10-CM | POA: Diagnosis not present

## 2017-10-06 DIAGNOSIS — S81801A Unspecified open wound, right lower leg, initial encounter: Secondary | ICD-10-CM | POA: Diagnosis present

## 2017-10-06 DIAGNOSIS — I87311 Chronic venous hypertension (idiopathic) with ulcer of right lower extremity: Secondary | ICD-10-CM | POA: Insufficient documentation

## 2017-10-06 DIAGNOSIS — L97812 Non-pressure chronic ulcer of other part of right lower leg with fat layer exposed: Secondary | ICD-10-CM | POA: Diagnosis not present

## 2017-10-06 DIAGNOSIS — L942 Calcinosis cutis: Secondary | ICD-10-CM | POA: Insufficient documentation

## 2017-10-06 DIAGNOSIS — I10 Essential (primary) hypertension: Secondary | ICD-10-CM | POA: Insufficient documentation

## 2017-10-06 LAB — PHOSPHORUS: Phosphorus: 3.3 mg/dL (ref 2.5–4.6)

## 2017-10-06 LAB — BASIC METABOLIC PANEL
Anion gap: 10 (ref 5–15)
BUN: 8 mg/dL (ref 6–20)
CHLORIDE: 99 mmol/L — AB (ref 101–111)
CO2: 26 mmol/L (ref 22–32)
Calcium: 9.3 mg/dL (ref 8.9–10.3)
Creatinine, Ser: 0.52 mg/dL (ref 0.44–1.00)
GFR calc Af Amer: >60 mL/min (ref >60)
GFR calc non Af Amer: >60 mL/min (ref >60)
Glucose, Bld: 98 mg/dL (ref 65–99)
POTASSIUM: 3.9 mmol/L (ref 3.5–5.1)
Sodium: 135 mmol/L (ref 135–145)

## 2017-10-13 DIAGNOSIS — I87311 Chronic venous hypertension (idiopathic) with ulcer of right lower extremity: Secondary | ICD-10-CM | POA: Diagnosis not present

## 2017-10-27 DIAGNOSIS — I87311 Chronic venous hypertension (idiopathic) with ulcer of right lower extremity: Secondary | ICD-10-CM | POA: Diagnosis not present

## 2017-11-02 ENCOUNTER — Encounter (HOSPITAL_BASED_OUTPATIENT_CLINIC_OR_DEPARTMENT_OTHER): Payer: Medicare Other | Attending: Internal Medicine

## 2017-11-02 DIAGNOSIS — L942 Calcinosis cutis: Secondary | ICD-10-CM | POA: Insufficient documentation

## 2017-11-02 DIAGNOSIS — I87313 Chronic venous hypertension (idiopathic) with ulcer of bilateral lower extremity: Secondary | ICD-10-CM | POA: Diagnosis not present

## 2017-11-02 DIAGNOSIS — X58XXXD Exposure to other specified factors, subsequent encounter: Secondary | ICD-10-CM | POA: Insufficient documentation

## 2017-11-02 DIAGNOSIS — S81811D Laceration without foreign body, right lower leg, subsequent encounter: Secondary | ICD-10-CM | POA: Diagnosis not present

## 2017-11-02 DIAGNOSIS — I10 Essential (primary) hypertension: Secondary | ICD-10-CM | POA: Diagnosis not present

## 2017-11-16 DIAGNOSIS — I87313 Chronic venous hypertension (idiopathic) with ulcer of bilateral lower extremity: Secondary | ICD-10-CM | POA: Diagnosis not present

## 2017-11-30 ENCOUNTER — Encounter (HOSPITAL_BASED_OUTPATIENT_CLINIC_OR_DEPARTMENT_OTHER): Payer: Medicare HMO | Attending: Internal Medicine

## 2017-11-30 DIAGNOSIS — I87311 Chronic venous hypertension (idiopathic) with ulcer of right lower extremity: Secondary | ICD-10-CM | POA: Insufficient documentation

## 2017-11-30 DIAGNOSIS — L942 Calcinosis cutis: Secondary | ICD-10-CM | POA: Insufficient documentation

## 2017-11-30 DIAGNOSIS — L97812 Non-pressure chronic ulcer of other part of right lower leg with fat layer exposed: Secondary | ICD-10-CM | POA: Diagnosis not present

## 2017-11-30 DIAGNOSIS — L97919 Non-pressure chronic ulcer of unspecified part of right lower leg with unspecified severity: Secondary | ICD-10-CM | POA: Diagnosis not present

## 2017-12-14 DIAGNOSIS — L97812 Non-pressure chronic ulcer of other part of right lower leg with fat layer exposed: Secondary | ICD-10-CM | POA: Diagnosis not present

## 2017-12-14 DIAGNOSIS — I87311 Chronic venous hypertension (idiopathic) with ulcer of right lower extremity: Secondary | ICD-10-CM | POA: Diagnosis not present

## 2017-12-14 DIAGNOSIS — L942 Calcinosis cutis: Secondary | ICD-10-CM | POA: Diagnosis not present

## 2017-12-21 DIAGNOSIS — L97812 Non-pressure chronic ulcer of other part of right lower leg with fat layer exposed: Secondary | ICD-10-CM | POA: Diagnosis not present

## 2017-12-21 DIAGNOSIS — L97919 Non-pressure chronic ulcer of unspecified part of right lower leg with unspecified severity: Secondary | ICD-10-CM | POA: Diagnosis not present

## 2017-12-21 DIAGNOSIS — L942 Calcinosis cutis: Secondary | ICD-10-CM | POA: Diagnosis not present

## 2017-12-21 DIAGNOSIS — I87311 Chronic venous hypertension (idiopathic) with ulcer of right lower extremity: Secondary | ICD-10-CM | POA: Diagnosis not present

## 2017-12-28 DIAGNOSIS — L942 Calcinosis cutis: Secondary | ICD-10-CM | POA: Diagnosis not present

## 2017-12-28 DIAGNOSIS — I87311 Chronic venous hypertension (idiopathic) with ulcer of right lower extremity: Secondary | ICD-10-CM | POA: Diagnosis not present

## 2017-12-28 DIAGNOSIS — L97812 Non-pressure chronic ulcer of other part of right lower leg with fat layer exposed: Secondary | ICD-10-CM | POA: Diagnosis not present

## 2018-01-04 ENCOUNTER — Encounter (HOSPITAL_BASED_OUTPATIENT_CLINIC_OR_DEPARTMENT_OTHER): Payer: Medicare HMO | Attending: Internal Medicine

## 2018-01-04 DIAGNOSIS — L97812 Non-pressure chronic ulcer of other part of right lower leg with fat layer exposed: Secondary | ICD-10-CM | POA: Insufficient documentation

## 2018-01-04 DIAGNOSIS — L942 Calcinosis cutis: Secondary | ICD-10-CM | POA: Insufficient documentation

## 2018-01-04 DIAGNOSIS — I87311 Chronic venous hypertension (idiopathic) with ulcer of right lower extremity: Secondary | ICD-10-CM | POA: Diagnosis not present

## 2018-01-04 DIAGNOSIS — L03115 Cellulitis of right lower limb: Secondary | ICD-10-CM | POA: Diagnosis not present

## 2018-01-04 DIAGNOSIS — I10 Essential (primary) hypertension: Secondary | ICD-10-CM | POA: Diagnosis not present

## 2018-01-11 DIAGNOSIS — I10 Essential (primary) hypertension: Secondary | ICD-10-CM | POA: Diagnosis not present

## 2018-01-11 DIAGNOSIS — L942 Calcinosis cutis: Secondary | ICD-10-CM | POA: Diagnosis not present

## 2018-01-11 DIAGNOSIS — L97812 Non-pressure chronic ulcer of other part of right lower leg with fat layer exposed: Secondary | ICD-10-CM | POA: Diagnosis not present

## 2018-01-11 DIAGNOSIS — S81801A Unspecified open wound, right lower leg, initial encounter: Secondary | ICD-10-CM | POA: Diagnosis not present

## 2018-01-11 DIAGNOSIS — L03115 Cellulitis of right lower limb: Secondary | ICD-10-CM | POA: Diagnosis not present

## 2018-01-11 DIAGNOSIS — I87311 Chronic venous hypertension (idiopathic) with ulcer of right lower extremity: Secondary | ICD-10-CM | POA: Diagnosis not present

## 2018-01-18 DIAGNOSIS — S81801A Unspecified open wound, right lower leg, initial encounter: Secondary | ICD-10-CM | POA: Diagnosis not present

## 2018-01-18 DIAGNOSIS — I87311 Chronic venous hypertension (idiopathic) with ulcer of right lower extremity: Secondary | ICD-10-CM | POA: Diagnosis not present

## 2018-01-18 DIAGNOSIS — L03115 Cellulitis of right lower limb: Secondary | ICD-10-CM | POA: Diagnosis not present

## 2018-01-18 DIAGNOSIS — L97812 Non-pressure chronic ulcer of other part of right lower leg with fat layer exposed: Secondary | ICD-10-CM | POA: Diagnosis not present

## 2018-01-18 DIAGNOSIS — L942 Calcinosis cutis: Secondary | ICD-10-CM | POA: Diagnosis not present

## 2018-01-18 DIAGNOSIS — I10 Essential (primary) hypertension: Secondary | ICD-10-CM | POA: Diagnosis not present

## 2018-02-01 ENCOUNTER — Encounter (HOSPITAL_BASED_OUTPATIENT_CLINIC_OR_DEPARTMENT_OTHER): Payer: Medicare HMO | Attending: Internal Medicine

## 2018-02-01 DIAGNOSIS — I10 Essential (primary) hypertension: Secondary | ICD-10-CM | POA: Insufficient documentation

## 2018-02-01 DIAGNOSIS — I87311 Chronic venous hypertension (idiopathic) with ulcer of right lower extremity: Secondary | ICD-10-CM | POA: Insufficient documentation

## 2018-02-01 DIAGNOSIS — S81801A Unspecified open wound, right lower leg, initial encounter: Secondary | ICD-10-CM | POA: Diagnosis not present

## 2018-02-01 DIAGNOSIS — L97812 Non-pressure chronic ulcer of other part of right lower leg with fat layer exposed: Secondary | ICD-10-CM | POA: Diagnosis not present

## 2018-02-01 DIAGNOSIS — L942 Calcinosis cutis: Secondary | ICD-10-CM | POA: Diagnosis not present

## 2018-02-15 DIAGNOSIS — L942 Calcinosis cutis: Secondary | ICD-10-CM | POA: Diagnosis not present

## 2018-02-15 DIAGNOSIS — L97812 Non-pressure chronic ulcer of other part of right lower leg with fat layer exposed: Secondary | ICD-10-CM | POA: Diagnosis not present

## 2018-02-15 DIAGNOSIS — I87311 Chronic venous hypertension (idiopathic) with ulcer of right lower extremity: Secondary | ICD-10-CM | POA: Diagnosis not present

## 2018-02-15 DIAGNOSIS — I10 Essential (primary) hypertension: Secondary | ICD-10-CM | POA: Diagnosis not present

## 2018-03-30 DIAGNOSIS — I10 Essential (primary) hypertension: Secondary | ICD-10-CM | POA: Diagnosis not present

## 2018-03-30 DIAGNOSIS — E782 Mixed hyperlipidemia: Secondary | ICD-10-CM | POA: Diagnosis not present

## 2018-07-25 DIAGNOSIS — R69 Illness, unspecified: Secondary | ICD-10-CM | POA: Diagnosis not present

## 2018-11-06 ENCOUNTER — Other Ambulatory Visit: Payer: Self-pay | Admitting: Nurse Practitioner

## 2018-11-06 DIAGNOSIS — R4789 Other speech disturbances: Secondary | ICD-10-CM | POA: Diagnosis not present

## 2018-11-06 DIAGNOSIS — F0150 Vascular dementia without behavioral disturbance: Secondary | ICD-10-CM

## 2018-11-06 DIAGNOSIS — E7800 Pure hypercholesterolemia, unspecified: Secondary | ICD-10-CM | POA: Diagnosis not present

## 2018-11-06 DIAGNOSIS — I10 Essential (primary) hypertension: Secondary | ICD-10-CM | POA: Diagnosis not present

## 2018-11-06 DIAGNOSIS — R69 Illness, unspecified: Secondary | ICD-10-CM | POA: Diagnosis not present

## 2018-11-09 ENCOUNTER — Ambulatory Visit
Admission: RE | Admit: 2018-11-09 | Discharge: 2018-11-09 | Disposition: A | Discharge status: Home / Self Care | Payer: Medicare HMO | Source: Ambulatory Visit | Attending: Nurse Practitioner | Admitting: Nurse Practitioner

## 2018-11-09 DIAGNOSIS — R413 Other amnesia: Secondary | ICD-10-CM | POA: Diagnosis not present

## 2018-11-09 DIAGNOSIS — F0150 Vascular dementia without behavioral disturbance: Secondary | ICD-10-CM

## 2018-11-26 DIAGNOSIS — R4789 Other speech disturbances: Secondary | ICD-10-CM | POA: Diagnosis not present

## 2018-11-26 DIAGNOSIS — I10 Essential (primary) hypertension: Secondary | ICD-10-CM | POA: Diagnosis not present

## 2018-11-26 DIAGNOSIS — R69 Illness, unspecified: Secondary | ICD-10-CM | POA: Diagnosis not present

## 2019-01-17 ENCOUNTER — Encounter: Payer: Self-pay | Admitting: Neurology

## 2019-01-17 ENCOUNTER — Ambulatory Visit: Payer: Medicare HMO | Admitting: Neurology

## 2019-01-17 VITALS — BP 132/70 | HR 70 | Ht 66.0 in | Wt 146.0 lb

## 2019-01-17 DIAGNOSIS — G301 Alzheimer's disease with late onset: Secondary | ICD-10-CM | POA: Diagnosis not present

## 2019-01-17 DIAGNOSIS — E538 Deficiency of other specified B group vitamins: Secondary | ICD-10-CM

## 2019-01-17 DIAGNOSIS — R413 Other amnesia: Secondary | ICD-10-CM

## 2019-01-17 DIAGNOSIS — G309 Alzheimer's disease, unspecified: Secondary | ICD-10-CM

## 2019-01-17 DIAGNOSIS — F0280 Dementia in other diseases classified elsewhere without behavioral disturbance: Secondary | ICD-10-CM | POA: Diagnosis not present

## 2019-01-17 DIAGNOSIS — R69 Illness, unspecified: Secondary | ICD-10-CM | POA: Diagnosis not present

## 2019-01-17 HISTORY — DX: Dementia in other diseases classified elsewhere, unspecified severity, without behavioral disturbance, psychotic disturbance, mood disturbance, and anxiety: F02.80

## 2019-01-17 MED ORDER — DONEPEZIL HCL 5 MG PO TABS: 5.0000 mg | ORAL_TABLET | Freq: Every day | ORAL | 1 refills | Status: DC

## 2019-01-17 NOTE — Progress Notes (Signed)
Reason for visit: Memory disturbance  Referring physician: Dr. [name] is a 83 y.o. female  History of present illness:  Alicia [name] is an 83 year old right-handed white female with a history of onset of troubles with memory and word finding that began 6 to 8 months ago.  The issue has progressed relatively rapidly, the patient is having increasing difficulty speaking because she cannot get the right words out.  She has no problems with understanding speech.  She has been started on Namenda, she tolerates the drug fairly well.  She does have some short-term memory problems, she will repeat herself on occasion.  She may have difficulty remembering recent events.  The patient is able to operate a motor vehicle without difficulty, she does not get lost.  She is able to manage her own medications and appointments.  She does not do the finances, and never has.  She claims that she sleeps fairly well at night, she has a good energy level during the day.  She is able to cook without difficulty.  She does not have any focal numbness or weakness of the face, arms, legs.  She denies any troubles controlling the bowels or the bladder.  She has no balance issues.  Otherwise, she has been quite healthy.  There has been no family history of memory disorders.  Past Medical History:  Diagnosis Date  . Anxiety   . Arthritis   . Frequent UTI    over the last year  . Hypertension   . Shingles   . Urinary frequency    over the year  . Urinary urgency     Past Surgical History:  Procedure Laterality Date  . BUNIONECTOMY     both feet  . EYE SURGERY Left 04-2013   cataract Left eye  . LUMBAR LAMINECTOMY N/A 10/14/2013   Procedure: MICRODISCECTOMY LUMBAR LAMINECTOMY/L4-5 Decompression, Excision Extradural Intraspinal Cyst;  Surgeon: Marybelle Killings, MD;  Location: [facility];  Service: Orthopedics;  Laterality: N/A;  L4-5 Decompression, Excision Extradural Intraspinal Cyst    Family History    Problem Relation Age of Onset  . High blood pressure Mother     Social history:  reports that she has never smoked. She has never used smokeless tobacco. She reports current alcohol use of about 2.0 standard drinks of alcohol per week. She reports that she does not use drugs.  Medications:  Prior to Admission medications   Medication Sig Start Date End Date Taking? Authorizing Provider  lisinopril (PRINIVIL,ZESTRIL) 20 MG tablet Take 20 mg by mouth daily.   Yes [provider]  memantine (NAMENDA) 10 MG tablet Take 10 mg by mouth 2 (two) times daily. 01/10/19  Yes [provider]  aspirin EC 81 MG tablet Take 81 mg by mouth daily.    [provider]  CALCIUM PO Take 1 tablet by mouth daily.    [provider]  Cholecalciferol (VITAMIN D PO) Take 1 tablet by mouth daily.    [provider]  Cyanocobalamin (VITAMIN B 12 PO) Take 1 tablet by mouth daily.    [provider]  HYDROcodone-acetaminophen (NORCO/VICODIN) 5-325 MG per tablet Take 1-2 tablets by mouth every 4 (four) hours as needed for moderate pain. 10/15/13   Phillips Hay, PA-C  MAGNESIUM PO Take 1 tablet by mouth daily.    [provider]  Omega-3 Fatty Acids (FISH OIL PO) Take 1 capsule by mouth daily.    [provider]  Polyvinyl  Alcohol-Povidone (REFRESH OP) Place 1 drop into both eyes daily.    [provider]     No Known Allergies  ROS:  Out of a complete 14 system review of symptoms, the patient complains only of the following symptoms, and all other reviewed systems are negative.  Memory loss, confusion Anxiety  Blood pressure 132/70, pulse 70, height 5' 6"  (1.676 m), weight 146 lb (66.2 kg), SpO2 94 %.  Physical Exam  General: The patient is alert and cooperative at the time of the examination.  Eyes: Pupils are equal, round, and reactive to light. Discs are flat bilaterally.  Neck: The neck is supple, no carotid bruits are  noted.  Respiratory: The respiratory examination is clear.  Cardiovascular: The cardiovascular examination reveals a regular rate and rhythm, no obvious murmurs or rubs are noted.  Skin: Extremities are without significant edema.  Neurologic Exam  Mental status: The patient is alert and oriented x 3 at the time of the examination. The Mini-Mental status examination done today shows a total score of 17/30.  Cranial nerves: Facial symmetry is present. There is good sensation of the face to pinprick and soft touch bilaterally. The strength of the facial muscles and the muscles to head turning and shoulder shrug are normal bilaterally. Speech is associated with severe word finding problems, nonfluent, no dysarthria is noted. Extraocular movements are full. Visual fields are full. The tongue is midline, and the patient has symmetric elevation of the soft palate. No obvious hearing deficits are noted.  Motor: The motor testing reveals 5 over 5 strength of all 4 extremities. Good symmetric motor tone is noted throughout.  Sensory: Sensory testing is intact to pinprick, soft touch, vibration sensation, and position sense on all 4 extremities. No evidence of extinction is noted.  Coordination: Cerebellar testing reveals good finger-nose-finger and heel-to-shin bilaterally.  Some slight apraxia with use of the lower extremities is noted.  Gait and station: Gait is normal. Tandem gait is normal. Romberg is negative. No drift is seen.  Reflexes: Deep tendon reflexes are symmetric and normal bilaterally. Toes are downgoing bilaterally.   CT head 11/09/18:  IMPRESSION: Atrophy with patchy periventricular small vessel disease. No acute infarct. No mass or hemorrhage.  There are foci of arterial vascular calcification. There is mucosal thickening in several ethmoid air cells.  * CT scan images were reviewed online. I agree with the written report.    Assessment/Plan:  1.  Memory disturbance,  Alzheimer's disease  The patient likely has Alzheimer disease associated with word finding problems that have come on out of proportion to other cognitive functions.  The patient has been placed on Namenda, she will be placed on Aricept beginning at 5 mg at night, she does well on this she will be increased to 10 mg.  Blood work will be done today.  She will follow-up in 6 months.  Jill Alexanders MD 01/17/2019 11:38 AM  [facility] [address] [place], [place] [address]  Phone [phone] Fax [phone]

## 2019-01-17 NOTE — Patient Instructions (Signed)
We will start Aricept for the memory.   Begin Aricept (donepezil) at 5 mg at night for one month. If this medication is well-tolerated, please call our office and we will call in a prescription for the 10 mg tablets. Look out for side effects that may include nausea, diarrhea, weight loss, or stomach cramps. This medication will also cause a runny nose, therefore there is no need for allergy medications for this purpose.

## 2019-01-18 LAB — RPR: RPR Ser Ql: NONREACTIVE

## 2019-01-18 LAB — SEDIMENTATION RATE: Sed Rate: 2 mm/hr (ref 0–40)

## 2019-01-18 LAB — VITAMIN B12: Vitamin B-12: 545 pg/mL (ref 232–1245)

## 2019-01-21 ENCOUNTER — Telehealth: Payer: Self-pay

## 2019-01-21 NOTE — Telephone Encounter (Signed)
-----   Message from Kathrynn Ducking, MD sent at 01/18/2019  8:46 AM EST -----  The blood work results are unremarkable. Please call the patient. ----- Message ----- From: Lavone Neri Lab Results In Sent: 01/18/2019   5:39 AM EST To: Kathrynn Ducking, MD

## 2019-01-21 NOTE — Telephone Encounter (Signed)
I contacted the patient and advised of results per Dr. Jannifer Franklin. Patient verbalized understanding/appreciation for the call.

## 2019-02-09 ENCOUNTER — Other Ambulatory Visit: Payer: Self-pay | Admitting: Neurology

## 2019-03-07 ENCOUNTER — Other Ambulatory Visit: Payer: Self-pay

## 2019-03-07 MED ORDER — DONEPEZIL HCL 10 MG PO TABS: 10.0000 mg | ORAL_TABLET | Freq: Every day | ORAL | 5 refills | Status: DC

## 2019-03-07 NOTE — Telephone Encounter (Signed)
Patient called in on 03/06/19 and stated the aricept 5 mg has been working well for him and is agreeable to increasing to 10 mg. Per ckw note on 01/17/19 if 5 mg is tolerated well we can increase to 10 mg.  10 mg aricept submitted for a 30 day supply with 5 refills to [facility] on [address].

## 2019-03-12 ENCOUNTER — Other Ambulatory Visit: Payer: Self-pay | Admitting: Neurology

## 2019-07-08 DIAGNOSIS — R69 Illness, unspecified: Secondary | ICD-10-CM | POA: Diagnosis not present

## 2019-07-08 DIAGNOSIS — C4490 Unspecified malignant neoplasm of skin, unspecified: Secondary | ICD-10-CM | POA: Diagnosis not present

## 2019-07-08 DIAGNOSIS — I10 Essential (primary) hypertension: Secondary | ICD-10-CM | POA: Diagnosis not present

## 2019-07-08 DIAGNOSIS — I8393 Asymptomatic varicose veins of bilateral lower extremities: Secondary | ICD-10-CM | POA: Diagnosis not present

## 2019-07-08 DIAGNOSIS — G301 Alzheimer's disease with late onset: Secondary | ICD-10-CM | POA: Diagnosis not present

## 2019-07-08 DIAGNOSIS — E782 Mixed hyperlipidemia: Secondary | ICD-10-CM | POA: Diagnosis not present

## 2019-07-09 ENCOUNTER — Telehealth: Payer: Self-pay | Admitting: Neurology

## 2019-07-09 NOTE — Telephone Encounter (Signed)
I called patient and LVM regarding rescheduling 8/20 appt due to NP out that afternoon. Requested patient call back to reschedule.

## 2019-07-18 ENCOUNTER — Ambulatory Visit: Payer: Medicare HMO | Admitting: Neurology

## 2019-07-27 ENCOUNTER — Emergency Department (HOSPITAL_COMMUNITY): Payer: Medicare HMO

## 2019-07-27 ENCOUNTER — Encounter (HOSPITAL_COMMUNITY): Payer: Self-pay | Admitting: Emergency Medicine

## 2019-07-27 ENCOUNTER — Other Ambulatory Visit: Payer: Self-pay

## 2019-07-27 ENCOUNTER — Emergency Department (HOSPITAL_COMMUNITY)
Admission: EM | Admit: 2019-07-27 | Discharge: 2019-07-27 | Disposition: A | Discharge status: Home / Self Care | Payer: Medicare HMO | Attending: Emergency Medicine | Admitting: Emergency Medicine

## 2019-07-27 DIAGNOSIS — A421 Abdominal actinomycosis: Secondary | ICD-10-CM | POA: Diagnosis not present

## 2019-07-27 DIAGNOSIS — R1110 Vomiting, unspecified: Secondary | ICD-10-CM | POA: Diagnosis not present

## 2019-07-27 DIAGNOSIS — R404 Transient alteration of awareness: Secondary | ICD-10-CM | POA: Diagnosis not present

## 2019-07-27 DIAGNOSIS — R55 Syncope and collapse: Secondary | ICD-10-CM | POA: Insufficient documentation

## 2019-07-27 DIAGNOSIS — R29810 Facial weakness: Secondary | ICD-10-CM | POA: Diagnosis not present

## 2019-07-27 DIAGNOSIS — R4020 Unspecified coma: Secondary | ICD-10-CM | POA: Diagnosis not present

## 2019-07-27 DIAGNOSIS — K598 Other specified functional intestinal disorders: Secondary | ICD-10-CM | POA: Diagnosis not present

## 2019-07-27 DIAGNOSIS — I10 Essential (primary) hypertension: Secondary | ICD-10-CM | POA: Diagnosis not present

## 2019-07-27 DIAGNOSIS — G4089 Other seizures: Secondary | ICD-10-CM | POA: Diagnosis not present

## 2019-07-27 DIAGNOSIS — R4182 Altered mental status, unspecified: Secondary | ICD-10-CM | POA: Diagnosis not present

## 2019-07-27 DIAGNOSIS — R569 Unspecified convulsions: Secondary | ICD-10-CM | POA: Diagnosis not present

## 2019-07-27 LAB — HEPATIC FUNCTION PANEL
ALT: 11 U/L (ref 0–44)
AST: 19 U/L (ref 15–41)
Albumin: 4 g/dL (ref 3.5–5.0)
Alkaline Phosphatase: 44 U/L (ref 38–126)
Bilirubin, Direct: 0.4 mg/dL — ABNORMAL HIGH (ref 0.0–0.2)
Indirect Bilirubin: 1.6 mg/dL — ABNORMAL HIGH (ref 0.3–0.9)
Total Bilirubin: 2 mg/dL — ABNORMAL HIGH (ref 0.3–1.2)
Total Protein: 6.8 g/dL (ref 6.5–8.1)

## 2019-07-27 LAB — BASIC METABOLIC PANEL
Anion gap: 10 (ref 5–15)
BUN: 10 mg/dL (ref 8–23)
CO2: 23 mmol/L (ref 22–32)
Calcium: 10.3 mg/dL (ref 8.9–10.3)
Chloride: 100 mmol/L (ref 98–111)
Creatinine, Ser: 0.75 mg/dL (ref 0.44–1.00)
GFR calc Af Amer: >60 mL/min (ref >60)
GFR calc non Af Amer: >60 mL/min (ref >60)
Glucose, Bld: 134 mg/dL — ABNORMAL HIGH (ref 70–99)
Potassium: 4.3 mmol/L (ref 3.5–5.1)
Sodium: 133 mmol/L — ABNORMAL LOW (ref 135–145)

## 2019-07-27 LAB — CBC
HCT: 49.2 % — ABNORMAL HIGH (ref 36.0–46.0)
Hemoglobin: 16.4 g/dL — ABNORMAL HIGH (ref 12.0–15.0)
MCH: 33.9 pg (ref 26.0–34.0)
MCHC: 33.3 g/dL (ref 30.0–36.0)
MCV: 101.7 fL — ABNORMAL HIGH (ref 80.0–100.0)
Platelets: 200 10*3/uL (ref 150–400)
RBC: 4.84 MIL/uL (ref 3.87–5.11)
RDW: 13.9 % (ref 11.5–15.5)
WBC: 9 10*3/uL (ref 4.0–10.5)
nRBC: 0 % (ref 0.0–0.2)

## 2019-07-27 LAB — LIPASE, BLOOD: Lipase: 29 U/L (ref 11–51)

## 2019-07-27 LAB — LACTIC ACID, PLASMA: Lactic Acid, Venous: 1.5 mmol/L (ref 0.5–1.9)

## 2019-07-27 LAB — CBG MONITORING, ED: Glucose-Capillary: 108 mg/dL — ABNORMAL HIGH (ref 70–99)

## 2019-07-27 MED ORDER — IOHEXOL 300 MG/ML  SOLN
100.0000 mL | Freq: Once | INTRAMUSCULAR | Status: AC | PRN
  Administered 2019-07-27: 100 mL via INTRAVENOUS

## 2019-07-27 MED ORDER — LACTATED RINGERS IV BOLUS
1000.0000 mL | Freq: Once | INTRAVENOUS | Status: AC
  Administered 2019-07-27: 1000 mL via INTRAVENOUS

## 2019-07-27 MED ORDER — ONDANSETRON HCL 4 MG/2ML IJ SOLN
4.0000 mg | Freq: Once | INTRAMUSCULAR | Status: AC
  Administered 2019-07-27: 4 mg via INTRAVENOUS
  Filled 2019-07-27: qty 2

## 2019-07-27 NOTE — ED Triage Notes (Signed)
Pt BIB [facility] for questionable focal seizures. EMS advised when they arrived on scene they thought the pt was having a stroke. EMS advised the pt just had a blank stare on her face and would not respond. EMS advised the pt soon came out of that and was talking with them normally. EMS advised the pt did not want to come to the hospital. EMS advised they checked vitals, 12 lead EKG, CBG, and stroke exam which were all normal. EMS advised shortly after the pt had another episode where she just started to stare off into the distance and not respond to them. EMS them initiated transport. EMS advised the pt has no major neuro or cardiac hx, only dementia. EMS advised IV as noted, 12 lead unremarkable, and vital signs stable. EMS advised the pt has no complaints.   Pt arrived at hospital and has no complaints other then feeling "a bit under the weather."

## 2019-07-27 NOTE — ED Notes (Signed)
Patient verbalizes understanding of discharge instructions. Opportunity for questioning and answers were provided. Armband removed by staff, pt discharged from ED home via POV.  

## 2019-07-27 NOTE — ED Provider Notes (Signed)
Emergency Department Provider Note   I have reviewed the triage vital signs and the nursing notes.   HISTORY  Chief Complaint Seizures (Focal)   HPI Alicia [name] is a 83 y.o. female with multiple medical problems as documented below who presents the emergency department today with altered mental status.  History is from the patient, EMS and son.  Sounds like the patient had an episode of vomiting this morning had a small breakfast and had nausea and not feeling well throughout the day today not eating or drinking anything.  This evening she had at least 2 episodes where she got very weak and nauseous and unresponsive with limp body and close eyes.  EMS was called patient initially refused transport but then happened again with them was brought here for further evaluation.  No history of seizures.  No history of syncope.  No other associated medical problems.  Patient has no chest pain, headache, back pain, shortness of breath, leg swelling.   No other associated or modifying symptoms.    Past Medical History:  Diagnosis Date  . Alzheimer disease ([place]) 01/17/2019  . Anxiety   . Arthritis   . Frequent UTI    over the last year  . Hypertension   . Shingles   . Urinary frequency    over the year  . Urinary urgency     Patient Active Problem List   Diagnosis Date Noted  . Alzheimer disease ([place]) 01/17/2019  . Spinal stenosis, lumbar region, with neurogenic claudication 10/14/2013    Class: Diagnosis of    Past Surgical History:  Procedure Laterality Date  . BUNIONECTOMY     both feet  . EYE SURGERY Left 04-2013   cataract Left eye  . LUMBAR LAMINECTOMY N/A 10/14/2013   Procedure: MICRODISCECTOMY LUMBAR LAMINECTOMY/L4-5 Decompression, Excision Extradural Intraspinal Cyst;  Surgeon: Marybelle Killings, MD;  Location: [facility];  Service: Orthopedics;  Laterality: N/A;  L4-5 Decompression, Excision Extradural Intraspinal Cyst    Current Outpatient Rx  . Order #: [id number] Class:  Historical Med  . Order #: [id number] Class: Historical Med  . Order #: [id number] Class: Historical Med  . Order #: [id number] Class: Historical Med  . Order #: [id number] Class: Normal  . Order #: [id number] Class: Print  . Order #: [id number] Class: Historical Med  . Order #: [id number] Class: Historical Med  . Order #: [id number] Class: Historical Med  . Order #: [id number] Class: Historical Med  . Order #: [id number] Class: Historical Med    Allergies Patient has no known allergies.  Family History  Problem Relation Age of Onset  . High blood pressure Mother   . Diabetes Sister   . Cancer Sister   . High blood pressure Sister     Social History Social History   Tobacco Use  . Smoking status: Never Smoker  . Smokeless tobacco: Never Used  Substance Use Topics  . Alcohol use: Yes    Alcohol/week: 2.0 standard drinks    Types: 2 Glasses of wine per week    Comment: daily atleast 1-2 glasses  . Drug use: No    Review of Systems  All other systems negative except as documented in the HPI. All pertinent positives and negatives as reviewed in the HPI. ____________________________________________   PHYSICAL EXAM:  VITAL SIGNS: ED Triage Vitals  Enc Vitals Group     BP 07/27/19 0324 93/70     Pulse Rate 07/27/19 0324 63     Resp 07/27/19 0324 20     Temp 07/27/19 0324 97.8 F (  36.6 C)     Temp Source 07/27/19 0324 Oral     SpO2 07/27/19 0321 98 %     Weight 07/27/19 0327 150 lb (68 kg)     Height 07/27/19 0327 5' 5"  (1.651 m)    Constitutional: Alert and oriented. Well appearing and in no acute distress. Eyes: Conjunctivae are normal. PERRL. EOMI. Head: Atraumatic. Nose: No congestion/rhinnorhea. Mouth/Throat: Mucous membranes are moist.  Oropharynx non-erythematous. Neck: No stridor.  No meningeal signs.   Cardiovascular: Normal rate, regular rhythm. Good peripheral circulation. Grossly normal heart sounds.   Respiratory: Normal respiratory effort.  No retractions. Lungs CTAB.  Gastrointestinal: Soft and nontender. No distention.  Musculoskeletal: No lower extremity tenderness nor edema. No gross deformities of extremities. Neurologic:  No altered mental status, able to give full seemingly accurate history.  Face is symmetric, EOM's intact, pupils equal and reactive, vision intact, tongue and uvula midline without deviation. Upper and Lower extremity motor 5/5, intact pain perception in distal extremities, 2+ reflexes in biceps, patella and achilles tendons. Able to perform finger to nose normal with both hands.  Skin:  Skin is warm, dry and intact. No rash noted.  ____________________________________________   LABS (all labs ordered are listed, but only abnormal results are displayed)  Labs Reviewed  BASIC METABOLIC PANEL - Abnormal; Notable for the following components:      Result Value   Sodium 133 (*)    Glucose, Bld 134 (*)    All other components within normal limits  CBC - Abnormal; Notable for the following components:   Hemoglobin 16.4 (*)    HCT 49.2 (*)    MCV 101.7 (*)    All other components within normal limits  HEPATIC FUNCTION PANEL - Abnormal; Notable for the following components:   Total Bilirubin 2.0 (*)    Bilirubin, Direct 0.4 (*)    Indirect Bilirubin 1.6 (*)    All other components within normal limits  CBG MONITORING, ED - Abnormal; Notable for the following components:   Glucose-Capillary 108 (*)    All other components within normal limits  LACTIC ACID, PLASMA  LIPASE, BLOOD  URINALYSIS, ROUTINE W REFLEX MICROSCOPIC   ____________________________________________  EKG   EKG Interpretation  Date/Time:  Saturday July 27 2019 03:22:44 EDT Ventricular Rate:  64 PR Interval:    QRS Duration: 117 QT Interval:  471 QTC Calculation: 486 R Axis:   -17 Text Interpretation:  Sinus rhythm Nonspecific intraventricular conduction delay Low voltage, extremity leads No acute changes Confirmed by Merrily Pew [phone]) on 07/27/2019  3:36:37 AM       ____________________________________________  RADIOLOGY  Dg Chest 2 View  Result Date: 07/27/2019 CLINICAL DATA:  Altered mental status EXAM: CHEST - 2 VIEW COMPARISON:  10/10/2013 FINDINGS: The heart size and mediastinal contours are within normal limits. Both lungs are clear. The visualized skeletal structures are unremarkable. IMPRESSION: No active cardiopulmonary disease. Electronically Signed   By: Ulyses Jarred M.D.   On: 07/27/2019 04:26   Ct Head Wo Contrast  Result Date: 07/27/2019 CLINICAL DATA:  Seizure EXAM: CT HEAD WITHOUT CONTRAST TECHNIQUE: Contiguous axial images were obtained from the base of the skull through the vertex without intravenous contrast. COMPARISON:  11/09/2018 FINDINGS: Brain: There is no mass, hemorrhage or extra-axial collection. The size and configuration of the ventricles and extra-axial CSF spaces are normal. There is hypoattenuation of the white matter, most commonly indicating chronic small vessel disease. Vascular: No abnormal hyperdensity of the major intracranial arteries or dural venous  sinuses. No intracranial atherosclerosis. Skull: The visualized skull base, calvarium and extracranial soft tissues are normal. Sinuses/Orbits: No fluid levels or advanced mucosal thickening of the visualized paranasal sinuses. No mastoid or middle ear effusion. The orbits are normal. IMPRESSION: Chronic small vessel disease without acute intracranial abnormality. Electronically Signed   By: Ulyses Jarred M.D.   On: 07/27/2019 04:37   Ct Abdomen Pelvis W Contrast  Result Date: 07/27/2019 CLINICAL DATA:  Abdominal infection EXAM: CT ABDOMEN AND PELVIS WITH CONTRAST TECHNIQUE: Multidetector CT imaging of the abdomen and pelvis was performed using the standard protocol following bolus administration of intravenous contrast. CONTRAST:  162m OMNIPAQUE IOHEXOL 300 MG/ML  SOLN COMPARISON:  None. FINDINGS: LOWER CHEST: There is no basilar pleural or apical  pericardial effusion. HEPATOBILIARY: The hepatic contours and density are normal. There is no intra- or extrahepatic biliary dilatation. The gallbladder is normal. PANCREAS: The pancreatic parenchymal contours are normal and there is no ductal dilatation. There is no peripancreatic fluid collection. SPLEEN: Normal. ADRENALS/URINARY TRACT: --Adrenal glands: Normal. --Right kidney/ureter: No hydronephrosis, nephroureterolithiasis, perinephric stranding or solid renal mass. --Left kidney/ureter: No hydronephrosis, nephroureterolithiasis, perinephric stranding or solid renal mass. --Urinary bladder: Normal for degree of distention STOMACH/BOWEL: --Stomach/Duodenum: Small sliding hiatal hernia --Small bowel: There is mild diffuse small bowel dilatation. There is wall thickening of the terminal ileum with minimal adjacent fat stranding. --Colon: No focal abnormality. --Appendix: Normal. VASCULAR/LYMPHATIC: There is aortic atherosclerosis without hemodynamically significant stenosis. The portal vein, splenic vein, superior mesenteric vein and IVC are patent. No abdominal or pelvic lymphadenopathy. REPRODUCTIVE: Normal uterus and ovaries. MUSCULOSKELETAL. No bony spinal canal stenosis or focal osseous abnormality. OTHER: Fluid containing right inguinal hernia. IMPRESSION: 1. Wall thickening of the terminal ileum with minimal adjacent fat stranding, which may indicate a mild ileitis. 2. Mild diffuse small bowel dilatation may be secondary to inflammation of the terminal ileum. No high grade obstruction. 3. Fluid containing right inguinal hernia. Aortic Atherosclerosis (ICD10-I70.0). Electronically Signed   By: KUlyses JarredM.D.   On: 07/27/2019 04:47    ____________________________________________   PROCEDURES  Procedure(s) performed:   Procedures ____________________________________________   INITIAL IMPRESSION / ASSESSMENT AND PLAN / ED COURSE  Syncopal vs near syncopal episodes likely dehydrated from  ileitis. Doubt seizures. Improved symptoms here. Workup negative. No indication for further management or workup in the ED. Plan for dc w/ PCP follow up.  Pertinent labs & imaging results that were available during my care of the patient were reviewed by me and considered in my medical decision making (see chart for details).  A medical screening exam was performed and I feel the patient has had an appropriate workup for their chief complaint at this time and likelihood of emergent condition existing is low. They have been counseled on decision, discharge, follow up and which symptoms necessitate immediate return to the emergency department. They or their family verbally stated understanding and agreement with plan and discharged in stable condition.   ____________________________________________  FINAL CLINICAL IMPRESSION(S) / ED DIAGNOSES  Final diagnoses:  Near syncope   MEDICATIONS GIVEN DURING THIS VISIT:  Medications  lactated ringers bolus 1,000 mL (0 mLs Intravenous Stopped 07/27/19 0526)  ondansetron (ZOFRAN) injection 4 mg (4 mg Intravenous Given 07/27/19 0350)  iohexol (OMNIPAQUE) 300 MG/ML solution 100 mL (100 mLs Intravenous Contrast Given 07/27/19 0418)     NEW OUTPATIENT MEDICATIONS STARTED DURING THIS VISIT:  New Prescriptions   No medications on file    Note:  This note was prepared with assistance  of Systems analyst. Occasional wrong-word or sound-a-like substitutions may have occurred due to the inherent limitations of voice recognition software.   Dionysios Massman, Corene Cornea, MD 07/27/19 [phone]

## 2019-08-08 ENCOUNTER — Other Ambulatory Visit: Payer: Self-pay | Admitting: Internal Medicine

## 2019-08-08 DIAGNOSIS — R1031 Right lower quadrant pain: Secondary | ICD-10-CM

## 2019-08-08 DIAGNOSIS — R55 Syncope and collapse: Secondary | ICD-10-CM | POA: Diagnosis not present

## 2019-08-08 DIAGNOSIS — R17 Unspecified jaundice: Secondary | ICD-10-CM | POA: Diagnosis not present

## 2019-08-09 ENCOUNTER — Other Ambulatory Visit: Payer: Self-pay

## 2019-08-09 ENCOUNTER — Ambulatory Visit
Admission: RE | Admit: 2019-08-09 | Discharge: 2019-08-09 | Disposition: A | Discharge status: Home / Self Care | Payer: Medicare HMO | Source: Ambulatory Visit | Attending: Internal Medicine | Admitting: Internal Medicine

## 2019-08-09 DIAGNOSIS — K5730 Diverticulosis of large intestine without perforation or abscess without bleeding: Secondary | ICD-10-CM | POA: Diagnosis not present

## 2019-08-09 DIAGNOSIS — N200 Calculus of kidney: Secondary | ICD-10-CM | POA: Diagnosis not present

## 2019-08-09 DIAGNOSIS — R1031 Right lower quadrant pain: Secondary | ICD-10-CM

## 2019-08-09 MED ORDER — IOPAMIDOL (ISOVUE-300) INJECTION 61%
100.0000 mL | Freq: Once | INTRAVENOUS | Status: AC | PRN
  Administered 2019-08-09: 100 mL via INTRAVENOUS

## 2019-08-14 DIAGNOSIS — K4190 Unilateral femoral hernia, without obstruction or gangrene, not specified as recurrent: Secondary | ICD-10-CM | POA: Diagnosis not present

## 2019-08-15 ENCOUNTER — Encounter: Payer: Self-pay | Admitting: Physician Assistant

## 2019-08-17 DIAGNOSIS — R21 Rash and other nonspecific skin eruption: Secondary | ICD-10-CM | POA: Diagnosis not present

## 2019-08-17 DIAGNOSIS — T7840XA Allergy, unspecified, initial encounter: Secondary | ICD-10-CM | POA: Diagnosis not present

## 2019-08-21 ENCOUNTER — Ambulatory Visit: Payer: Self-pay | Admitting: Neurology

## 2019-08-21 ENCOUNTER — Ambulatory Visit: Payer: Self-pay | Admitting: General Surgery

## 2019-08-21 DIAGNOSIS — K4190 Unilateral femoral hernia, without obstruction or gangrene, not specified as recurrent: Secondary | ICD-10-CM | POA: Diagnosis not present

## 2019-08-27 ENCOUNTER — Other Ambulatory Visit: Payer: Self-pay

## 2019-08-27 ENCOUNTER — Ambulatory Visit: Payer: Medicare HMO | Admitting: Cardiology

## 2019-08-27 ENCOUNTER — Ambulatory Visit (INDEPENDENT_AMBULATORY_CARE_PROVIDER_SITE_OTHER): Payer: Medicare HMO | Admitting: Physician Assistant

## 2019-08-27 ENCOUNTER — Other Ambulatory Visit (HOSPITAL_COMMUNITY)
Admission: RE | Admit: 2019-08-27 | Discharge: 2019-08-27 | Disposition: A | Discharge status: Home / Self Care | Payer: Medicare HMO | Source: Ambulatory Visit | Attending: General Surgery | Admitting: General Surgery

## 2019-08-27 ENCOUNTER — Encounter: Payer: Self-pay | Admitting: Physician Assistant

## 2019-08-27 VITALS — BP 128/86 | HR 79 | Temp 97.4°F | Ht 68.0 in | Wt 140.5 lb

## 2019-08-27 DIAGNOSIS — R935 Abnormal findings on diagnostic imaging of other abdominal regions, including retroperitoneum: Secondary | ICD-10-CM

## 2019-08-27 DIAGNOSIS — Z20828 Contact with and (suspected) exposure to other viral communicable diseases: Secondary | ICD-10-CM | POA: Insufficient documentation

## 2019-08-27 DIAGNOSIS — Z01812 Encounter for preprocedural laboratory examination: Secondary | ICD-10-CM | POA: Diagnosis not present

## 2019-08-27 NOTE — Patient Instructions (Signed)
I will call you in a few weeks to schedule your colonoscopy

## 2019-08-27 NOTE — Progress Notes (Signed)
____________________________________________________________  Attending physician addendum:  Thank you for sending this case to me, and for having me see the patient with you in clinic today.  I personally reviewed the CT images of both recent scans. I have reviewed the entire note, and the outlined plan is what we discussed.  It is difficult to tell whether this thickening of the most terminal ileum is related to the reportedly more proximal loop that is involved in this hernia.  Her symptoms certainly suggest intermittent mechanical obstruction, and the first of the 2 recent scans clearly shows partial small bowel obstruction.  It is just difficult to tell whether there may be an underlying process in the terminal ileum.  I agree that at her age and without diarrhea, Crohn's disease seems unlikely.  However, that and neoplasia should also be ruled out.  Colonoscopy in 4 to 6 weeks after hernia repair.  Wilfrid Lund, MD  ____________________________________________________________

## 2019-08-27 NOTE — Progress Notes (Signed)
PATIENT: Alicia [name] DOB: [birth year]  REASON FOR VISIT: follow up HISTORY FROM: patient  HISTORY OF PRESENT ILLNESS: Today 08/28/19  Alicia [name] is an 83 year old female with history of memory disturbance and word finding difficulty.  She is here with her husband today.  She reports she continues to have difficulty with word finding.  She is still quite active.  She is able to perform all her own ADLs, and do cooking in the home.  She is even able to continue driving, but only short distances to places that she is familiar with.  She does manage her medications.  She denies any trouble with her walking or balance.  She has not had any falls.  She says with Covid, she has not been able to go out with her friends.  She does still talk on the phone with friends, and reports she may have difficulty finding the right words.  She remains on Namenda and Aricept.  She did have to decrease her dose of Aricept to 5 mg daily due to report of dizziness.  She is planning to have surgery in the next few days to have hernia repair.  She indicates she sleeps well at night.  She presents today for follow-up accompanied by her husband.  HISTORY 01/17/2019 Dr. Jannifer Franklin: Alicia [name] is an 83 year old right-handed white female with a history of onset of troubles with memory and word finding that began 6 to 8 months ago.  The issue has progressed relatively rapidly, the patient is having increasing difficulty speaking because she cannot get the right words out.  She has no problems with understanding speech.  She has been started on Namenda, she tolerates the drug fairly well.  She does have some short-term memory problems, she will repeat herself on occasion.  She may have difficulty remembering recent events.  The patient is able to operate a motor vehicle without difficulty, she does not get lost.  She is able to manage her own medications and appointments.  She does not do the finances, and never has.  She claims that  she sleeps fairly well at night, she has a good energy level during the day.  She is able to cook without difficulty.  She does not have any focal numbness or weakness of the [name], arms, legs.  She denies any troubles controlling the bowels or the bladder.  She has no balance issues.  Otherwise, she has been quite healthy.  There has been no family history of memory disorders.   REVIEW OF SYSTEMS: Out of a complete 14 system review of symptoms, the patient complains only of the following symptoms, and all other reviewed systems are negative.  Memory loss  ALLERGIES: Allergies  Allergen Reactions   Contrast Media [Iodinated Diagnostic Agents]     unknown    HOME MEDICATIONS: Outpatient Medications Prior to Visit  Medication Sig Dispense Refill   Cholecalciferol (VITAMIN D3) 50 MCG (2000 UT) TABS Take 2,000 Units by mouth daily.     ibuprofen (ADVIL) 200 MG tablet Take 400 mg by mouth every 8 (eight) hours as needed (for pain.).     lisinopril (PRINIVIL,ZESTRIL) 20 MG tablet Take 20 mg by mouth daily.     memantine (NAMENDA) 10 MG tablet Take 10 mg by mouth 2 (two) times daily.     Polyvinyl Alcohol-Povidone (REFRESH OP) Place 1 drop into both eyes daily.     donepezil (ARICEPT) 10 MG tablet Take 1 tablet (10 mg total) by mouth at  bedtime. (Patient taking differently: Take 5 mg by mouth at bedtime. ) 30 tablet 5   No facility-administered medications prior to visit.     PAST MEDICAL HISTORY: Past Medical History:  Diagnosis Date   Alzheimer disease ([place]) 01/17/2019   Anxiety    Arthritis    Frequent UTI    over the last year   Hypertension    Shingles    Urinary frequency    over the year   Urinary urgency     PAST SURGICAL HISTORY: Past Surgical History:  Procedure Laterality Date   BUNIONECTOMY     both feet   EYE SURGERY Left 04-2013   cataract Left eye   LUMBAR LAMINECTOMY N/A 10/14/2013   Procedure: MICRODISCECTOMY LUMBAR LAMINECTOMY/L4-5  Decompression, Excision Extradural Intraspinal Cyst;  Surgeon: Marybelle Killings, MD;  Location: [facility];  Service: Orthopedics;  Laterality: N/A;  L4-5 Decompression, Excision Extradural Intraspinal Cyst    FAMILY HISTORY: Family History  Problem Relation Age of Onset   High blood pressure Mother    Diabetes Sister    High blood pressure Sister    Breast cancer Sister    Colon cancer Neg Hx    Esophageal cancer Neg Hx     SOCIAL HISTORY: Social History   Socioeconomic History   Marital status: Married    Spouse name: Not on file   Number of children: Not on file   Years of education: 2   Highest education level: Some college, no degree  Occupational History   Occupation: retired  Scientist, product/process development strain: Not on file   Food insecurity    Worry: Not on file    Inability: Not on Lexicographer needs    Medical: Not on file    Non-medical: Not on file  Tobacco Use   Smoking status: Former Smoker   Smokeless tobacco: Never Used  Substance and Sexual Activity   Alcohol use: Yes    Alcohol/week: 2.0 standard drinks    Types: 2 Glasses of wine per week    Comment: daily atleast 1-2 glasses   Drug use: No   Sexual activity: Not Currently  Lifestyle   Physical activity    Days per week: Not on file    Minutes per session: Not on file   Stress: Not on file  Relationships   Social connections    Talks on phone: Not on file    Gets together: Not on file    Attends religious service: Not on file    Active member of club or organization: Not on file    Attends meetings of clubs or organizations: Not on file    Relationship status: Not on file   Intimate partner violence    Fear of current or ex partner: Not on file    Emotionally abused: Not on file    Physically abused: Not on file    Forced sexual activity: Not on file  Other Topics Concern   Not on file  Social History Narrative   Right handed   Lives at home with husband     Moderate caffeine usage     PHYSICAL EXAM  Vitals:   08/28/19 1351  BP: 133/87  Pulse: 69  Temp: (!) 96.2 F (35.7 C)  TempSrc: Oral  Weight: 141 lb 3.2 oz (64 kg)  Height: 5' 8"  (1.727 m)   Body mass index is 21.47 kg/m.  Generalized: Well developed, in no acute distress  MMSE -  Mini Mental State Exam 08/28/2019 08/28/2019 01/17/2019  Not completed: (No Data) Unable to complete -  Orientation to time 1 - 4  Orientation to Place 1 - 3  Registration 3 - 3  Attention/ Calculation 0 - 0  Recall 0 - 2  Language- name 2 objects 2 - 2  Language- repeat 0 - 0  Language- follow 3 step command 3 - 2  Language- read & follow direction 1 - 1  Write a sentence 1 - 0  Copy design 1 - 0  Total score 13 - 17    Neurological examination  Mentation: Alert, history taking. Follows all commands, word finding difficulty, answering MMSE questions.  Is very well-appearing. Cranial nerve II-XII: Pupils were equal round reactive to light. Extraocular movements were full, visual field were full on confrontational test. Facial sensation and strength were normal. Head turning and shoulder shrug were normal and symmetric. Motor: The motor testing reveals 5 over 5 strength of all 4 extremities. Good symmetric motor tone is noted throughout.  Sensory: Sensory testing is intact to soft touch on all 4 extremities. No evidence of extinction is noted.  Coordination: Cerebellar testing reveals good finger-nose-finger and heel-to-shin bilaterally.  Gait and station: Gait is normal. Tandem gait is normal. Romberg is negative. No drift is seen.  Reflexes: Deep tendon reflexes are symmetric and normal bilaterally.   DIAGNOSTIC DATA (LABS, IMAGING, TESTING) - I reviewed patient records, labs, notes, testing and imaging myself where available.  Lab Results  Component Value Date   WBC 9.0 07/27/2019   HGB 16.4 (H) 07/27/2019   HCT 49.2 (H) 07/27/2019   MCV 101.7 (H) 07/27/2019   PLT 200 07/27/2019        Component Value Date/Time   NA 133 (L) 07/27/2019 0316   K 4.3 07/27/2019 0316   CL 100 07/27/2019 0316   CO2 23 07/27/2019 0316   GLUCOSE 134 (H) 07/27/2019 0316   BUN 10 07/27/2019 0316   CREATININE 0.75 07/27/2019 0316   CALCIUM 10.3 07/27/2019 0316   PROT 6.8 07/27/2019 0350   ALBUMIN 4.0 07/27/2019 0350   AST 19 07/27/2019 0350   ALT 11 07/27/2019 0350   ALKPHOS 44 07/27/2019 0350   BILITOT 2.0 (H) 07/27/2019 0350   GFRNONAA >60 07/27/2019 0316   GFRAA >60 07/27/2019 0316   No results found for: CHOL, HDL, LDLCALC, LDLDIRECT, TRIG, CHOLHDL No results found for: HGBA1C Lab Results  Component Value Date   VITAMINB12 545 01/17/2019   No results found for: TSH  ASSESSMENT AND PLAN 83 y.o. year old female  has a past medical history of Alzheimer disease ([place]) (01/17/2019), Anxiety, Arthritis, Frequent UTI, Hypertension, Shingles, Urinary frequency, and Urinary urgency. here with:  1.  Memory disturbance, Alzheimer's  She became quite anxious while performing the MMSE.  Her score has declined since last visit, was 13/30 today vs 17/30 in February.  She continues to have difficulty with word finding. She is able to follow commands quite well and is very well-appearing.  She will continue taking Namenda (prescribed by primary) and we will keep her dose of Aricept at 5 mg at bedtime due to side effect of dizziness.  I have cautioned her husband to closely monitor her driving.  She will follow-up in 6 months or sooner if needed.  I did advise if her symptoms worsen or she develops any new symptoms she should let us know.  I spent 15 minutes with the patient. 50% of this time was spent discussing  her plan of care.  Butler Denmark, AGNP-C, DNP 08/28/2019, 4:13 PM [facility] [address], [place], [place] [address] [phone]

## 2019-08-27 NOTE — Progress Notes (Signed)
Chief Complaint: Abnormal CT of the abdomen and pelvis  HPI:    Alicia [name] is an 83 year old female with a past medical history as listed below, who was referred to me by Ileana Roup, MD for a complaint of abnormal CT the abdomen pelvis.      07/27/2019 patient seen in the ED for near syncope, nausea and vomiting with right lower quadrant pain.  At that time was thought episode likely from dehydration due to ileitis.  Is doubted this was a seizure.  Symptoms improved while in the ED.  CT abdomen pelvis at that time showed wall thickening of the terminal ileum with minimal adjacent fat stranding, which may indicate a mild ileitis.  Mild diffuse small bowel dilation secondary to inflammation of the terminal ileum with no high-grade obstruction.  Fluid containing right inguinal hernia.  Labs including CBC and BMP were normal.  Hepatic function panel showed total bilirubin elevation at 2.0, indirect elevated at 1.6, direct 8.4.    08/09/2019 CT abdomen pelvis with and without contrast for right lower quadrant abdominal pain and prior ileitis with abnormal wall thickening and a 4.5 cm segment of small bowel approaching the ileocecal valve, suspicious for terminal ileitis and potentially reflecting Crohn's disease.  A small bowel malignancy/mass is a differential diagnostic consideration although less likely given the regional inflammatory stranding shown on the prior exam.  No abscess or extraluminal gas identified.  There is also an indirect right inguinal hernia containing a loop of distal ileum.  The loop was proximal to the inflamed loop.  Did not see any findings of obstruction or overt strangulation at this time but the herniated loops of small bowel could possibly be contributing to the patient's right lower quadrant symptoms.  Bilateral nonobstructive renal calculi.  Sigmoid diverticulosis without active diverticulitis.  Aortic atherosclerosis and right foraminal impingement of L1-2.    Per chart  review patient is scheduled for hernia repair 08/30/2019.    Today, the patient presents to clinic accompanied by her husband who does assist with history as her short-term memory is mildly impaired.  Patient explains that she has been having episodes of right lower quadrant pain for the past 6 months.  They seem to come and go and most of the time she can lay back and rub on this area for "3 to 4 hours" per her husband and the pain will go away.  On 2 occasions though the pain did not dissipate and she ended up in the ER as above.  Both times CT showing question of ileitis and on the last occasion a hernia with strangulated small bowel.  Patient tells me over the past 3 to 4 months she has been having to use MiraLAX on occasion for constipation typically a couple times a week.  She has never completely obstructed though and does pass gas in between.  Currently the patient is feeling perfectly fine and does not even have any right lower quadrant pain.  No history of diarrhea.    No previous GI history.  No history of colonoscopy.    Denies fever, chills, blood in her stool or weight loss.  Past Medical History:  Diagnosis Date  . Alzheimer disease ([place]) 01/17/2019  . Anxiety   . Arthritis   . Frequent UTI    over the last year  . Hypertension   . Shingles   . Urinary frequency    over the year  . Urinary urgency     Past Surgical  History:  Procedure Laterality Date  . BUNIONECTOMY     both feet  . EYE SURGERY Left 04-2013   cataract Left eye  . LUMBAR LAMINECTOMY N/A 10/14/2013   Procedure: MICRODISCECTOMY LUMBAR LAMINECTOMY/L4-5 Decompression, Excision Extradural Intraspinal Cyst;  Surgeon: Marybelle Killings, MD;  Location: [facility];  Service: Orthopedics;  Laterality: N/A;  L4-5 Decompression, Excision Extradural Intraspinal Cyst    Current Outpatient Medications  Medication Sig Dispense Refill  . aspirin EC 81 MG tablet Take 81 mg by mouth daily.    Marland Kitchen CALCIUM PO Take 1 tablet by mouth daily.     . Cholecalciferol (VITAMIN D PO) Take 1 tablet by mouth daily.    . Cyanocobalamin (VITAMIN B 12 PO) Take 1 tablet by mouth daily.    Marland Kitchen donepezil (ARICEPT) 10 MG tablet Take 1 tablet (10 mg total) by mouth at bedtime. 30 tablet 5  . HYDROcodone-acetaminophen (NORCO/VICODIN) 5-325 MG per tablet Take 1-2 tablets by mouth every 4 (four) hours as needed for moderate pain. 30 tablet 0  . lisinopril (PRINIVIL,ZESTRIL) 20 MG tablet Take 20 mg by mouth daily.    Marland Kitchen MAGNESIUM PO Take 1 tablet by mouth daily.    . memantine (NAMENDA) 10 MG tablet Take 10 mg by mouth 2 (two) times daily.    . Omega-3 Fatty Acids (FISH OIL PO) Take 1 capsule by mouth daily.    . Polyvinyl Alcohol-Povidone (REFRESH OP) Place 1 drop into both eyes daily.     No current facility-administered medications for this visit.     Allergies as of 08/27/2019  . (No Known Allergies)    Family History  Problem Relation Age of Onset  . High blood pressure Mother   . Diabetes Sister   . Cancer Sister   . High blood pressure Sister     Social History   Socioeconomic History  . Marital status: Married    Spouse name: Not on file  . Number of children: Not on file  . Years of education: 2  . Highest education level: Some college, no degree  Occupational History  . Occupation: retired  Scientific laboratory technician  . Financial resource strain: Not on file  . Food insecurity    Worry: Not on file    Inability: Not on file  . Transportation needs    Medical: Not on file    Non-medical: Not on file  Tobacco Use  . Smoking status: Never Smoker  . Smokeless tobacco: Never Used  Substance and Sexual Activity  . Alcohol use: Yes    Alcohol/week: 2.0 standard drinks    Types: 2 Glasses of wine per week    Comment: daily atleast 1-2 glasses  . Drug use: No  . Sexual activity: Not Currently  Lifestyle  . Physical activity    Days per week: Not on file    Minutes per session: Not on file  . Stress: Not on file  Relationships  .  Social Herbalist on phone: Not on file    Gets together: Not on file    Attends religious service: Not on file    Active member of club or organization: Not on file    Attends meetings of clubs or organizations: Not on file    Relationship status: Not on file  . Intimate partner violence    Fear of current or ex partner: Not on file    Emotionally abused: Not on file    Physically abused: Not on file  Forced sexual activity: Not on file  Other Topics Concern  . Not on file  Social History Narrative   Right handed   Lives at home with husband    Moderate caffeine usage     Review of Systems:    Constitutional: No weight loss, fever or chills Skin: No rash  Cardiovascular: No chest pain Respiratory: No SOB  Gastrointestinal: See HPI and otherwise negative Genitourinary: No dysuria Neurological: No headache Musculoskeletal: No new muscle or joint pain Hematologic: No bleeding  Psychiatric: No history of depression or anxiety   Physical Exam:  Vital signs: BP 128/86   Pulse 79   Temp (!) 97.4 F (36.3 C)   Ht 5' 8"  (1.727 m)   Wt 140 lb 8 oz (63.7 kg)   BMI 21.36 kg/m   Constitutional:   Pleasant Elderly Caucasian female appears to be in NAD, Well developed, Well nourished, alert and cooperative Head:  Normocephalic and atraumatic. Eyes:   PEERL, EOMI. No icterus. Conjunctiva pink. Ears:  Normal auditory acuity. Neck:  Supple Throat: Oral cavity and pharynx without inflammation, swelling or lesion.  Respiratory: Respirations even and unlabored. Lungs clear to auscultation bilaterally.   No wheezes, crackles, or rhonchi.  Cardiovascular: Normal S1, S2. No MRG. Regular rate and rhythm. No peripheral edema, cyanosis or pallor.  Gastrointestinal:  Soft, nondistended, nontender. No rebound or guarding. Normal bowel sounds. No appreciable masses or hepatomegaly. Rectal:  Not performed.  Msk:  Symmetrical without gross deformities. Without edema, no deformity or  joint abnormality.  Neurologic:  Alert and  oriented x4;  grossly normal neurologically.  Skin:   Dry and intact without significant lesions or rashes. Psychiatric:  Demonstrates good judgement and reason without abnormal affect or behaviors.  RELEVANT LABS AND IMAGING and see HPI: CBC    Component Value Date/Time   WBC 9.0 07/27/2019 0316   RBC 4.84 07/27/2019 0316   HGB 16.4 (H) 07/27/2019 0316   HCT 49.2 (H) 07/27/2019 0316   PLT 200 07/27/2019 0316   MCV 101.7 (H) 07/27/2019 0316   MCH 33.9 07/27/2019 0316   MCHC 33.3 07/27/2019 0316   RDW 13.9 07/27/2019 0316    CMP     Component Value Date/Time   NA 133 (L) 07/27/2019 0316   K 4.3 07/27/2019 0316   CL 100 07/27/2019 0316   CO2 23 07/27/2019 0316   GLUCOSE 134 (H) 07/27/2019 0316   BUN 10 07/27/2019 0316   CREATININE 0.75 07/27/2019 0316   CALCIUM 10.3 07/27/2019 0316   PROT 6.8 07/27/2019 0350   ALBUMIN 4.0 07/27/2019 0350   AST 19 07/27/2019 0350   ALT 11 07/27/2019 0350   ALKPHOS 44 07/27/2019 0350   BILITOT 2.0 (H) 07/27/2019 0350   GFRNONAA >60 07/27/2019 0316   GFRAA >60 07/27/2019 0316    Assessment: 1.  Abnormal CT of the abdomen pelvis: Showing ileitis and right inguinal hernia with small bowel loops, patient scheduled for hernia surgery on 08/30/2019 2.  Right lower quadrant pain: Intermittent episodes over the past 6 months, likely related to hernia plus/minus ileitis as above  Plan: 1.  Dr. Loletha Carrow reviewed CT the abdomen pelvis at time of patient's appointment.  There is question regarding ileitis +/- a small mass in this area vs less likely Crohn's.  It is recommended that she have a colonoscopy 6 weeks after having her hernia repair in mid November to try to evaluate this area better.  Did discuss risks, benefits, limitations and alternatives the patient agrees  to proceed.  This is scheduled in the [facility] with Dr. Loletha Carrow. 2.  Patient to follow in clinic per recommendations from Dr. Loletha Carrow after time of  procedure.  Ellouise Newer, PA-C [facility] 08/27/2019, 3:45 PM  Cc: Ileana Roup, MD

## 2019-08-28 ENCOUNTER — Encounter: Payer: Self-pay | Admitting: Neurology

## 2019-08-28 ENCOUNTER — Ambulatory Visit: Payer: Medicare HMO | Admitting: Neurology

## 2019-08-28 VITALS — BP 133/87 | HR 69 | Temp 96.2°F | Ht 68.0 in | Wt 141.2 lb

## 2019-08-28 DIAGNOSIS — R69 Illness, unspecified: Secondary | ICD-10-CM | POA: Diagnosis not present

## 2019-08-28 DIAGNOSIS — F0280 Dementia in other diseases classified elsewhere without behavioral disturbance: Secondary | ICD-10-CM | POA: Diagnosis not present

## 2019-08-28 DIAGNOSIS — G301 Alzheimer's disease with late onset: Secondary | ICD-10-CM | POA: Diagnosis not present

## 2019-08-28 LAB — NOVEL CORONAVIRUS, NAA (HOSP ORDER, SEND-OUT TO REF LAB; TAT 18-24 HRS): SARS-CoV-2, NAA: NOT DETECTED

## 2019-08-28 MED ORDER — DONEPEZIL HCL 5 MG PO TABS: 5.0000 mg | ORAL_TABLET | Freq: Every day | ORAL | 6 refills | Status: DC

## 2019-08-28 NOTE — Progress Notes (Signed)
I have read the note, and I agree with the clinical assessment and plan.  Alicia [name]   

## 2019-08-28 NOTE — Patient Instructions (Signed)
1. Continue Namenda and Aricept  2.  Return in 6 months

## 2019-08-29 ENCOUNTER — Other Ambulatory Visit: Payer: Self-pay | Admitting: Neurology

## 2019-08-29 ENCOUNTER — Other Ambulatory Visit: Payer: Self-pay

## 2019-08-29 ENCOUNTER — Encounter (HOSPITAL_COMMUNITY): Payer: Self-pay | Admitting: *Deleted

## 2019-08-29 NOTE — H&P (Signed)
History of Present Illness Ralene Ok MD; 08/21/2019 2:16 PM) The patient is a 83 year old female who presents with an inguinal hernia. Patient is an 83 year old female comes in with a history hypertension, Alzheimer's, who comes in with a right inguinal versus femoral hernia. Patient states this is been there for numerous years. She is unsure of the exact time. She states of the last 2 months she's had more pain than the right inguinal area. She states recently she laid down in size and felt it pop back in. Patient states that thereafter she's had less pain. She states that she has not had any signs or symptoms of strangulation.  Patient recently underwent CT scan. CT scan was significant for a "indirect right inguinal hernia detaining loop of distal ileum is also. Had a 4.5 cm segment of small bowel pushing the IC valve suspicious for terminal ileitis. I independently reviewed the CT scan. This possible at this portion of concerned small bowel near the IC valve could've been incarcerated into the hernia was reduced when she massaged it.  Patient had no previous abdominal surgery.   Allergies Sallyanne Kuster, CMA; 08/21/2019 1:54 PM) Dyes  Oral Contrast Allergies Reconciled   Medication History Sallyanne Kuster, CMA; 08/21/2019 1:55 PM) Donepezil HCl (10MG Tablet, Oral) Active. Lisinopril (20MG Tablet, Oral) Active. Memantine HCl (10MG Tablet, Oral) Active. Vitamin B-12 (1000MCG Tablet, 1 (one) Oral) Active. Vitamin D (1000UNIT Tablet, Oral) Active. hydroCHLOROthiazide (25MG Tablet, Oral) Active.    Review of Systems Ralene Ok, MD; 08/21/2019 2:18 PM) General Not Present- Appetite Loss, Chills, Fatigue, Fever, Night Sweats, Weight Gain and Weight Loss. Skin Not Present- Change in Wart/Mole, Dryness, Hives, Jaundice, New Lesions, Non-Healing Wounds, Rash and Ulcer. HEENT Not Present- Earache, Hearing Loss, Hoarseness, Nose Bleed, Oral Ulcers, Ringing in the Ears,  Seasonal Allergies, Sinus Pain, Sore Throat, Visual Disturbances, Wears glasses/contact lenses and Yellow Eyes. Respiratory Not Present- Bloody sputum, Chronic Cough, Difficulty Breathing, Snoring and Wheezing. Breast Not Present- Breast Mass, Breast Pain, Nipple Discharge and Skin Changes. Cardiovascular Not Present- Chest Pain, Difficulty Breathing Lying Down, Leg Cramps, Palpitations, Rapid Heart Rate, Shortness of Breath and Swelling of Extremities. Gastrointestinal Present- Abdominal Pain and Change in Bowel Habits. Not Present- Bloating, Bloody Stool, Chronic diarrhea, Constipation, Difficulty Swallowing, Excessive gas, Gets full quickly at meals, Hemorrhoids, Indigestion, Nausea, Rectal Pain and Vomiting. Female Genitourinary Not Present- Frequency, Nocturia, Painful Urination, Pelvic Pain and Urgency. Musculoskeletal Not Present- Back Pain, Joint Pain, Joint Stiffness, Muscle Pain, Muscle Weakness and Swelling of Extremities. Neurological Present- Decreased Memory. Not Present- Fainting, Headaches, Numbness, Seizures, Tingling, Tremor, Trouble walking and Weakness. Psychiatric Not Present- Anxiety, Bipolar, Change in Sleep Pattern, Depression, Fearful and Frequent crying. Hematology Not Present- Blood Thinners, Easy Bruising, Excessive bleeding, Gland problems, HIV and Persistent Infections.  Vitals Sallyanne Kuster CMA; 08/21/2019 1:56 PM) 08/21/2019 1:56 PM Weight: 143.5 lb Height: 60in Body Surface Area: 1.62 m Body Mass Index: 28.03 kg/m  Temp.: 97.10F  Pulse: 96 (Regular)  BP: 140/80(Sitting, Left Arm, Standard)       Physical Exam Ralene Ok MD; 08/21/2019 2:17 PM) The physical exam findings are as follows: Note: Constitutional: No acute distress; conversant; no deformities Eyes: Moist conjunctiva; no lid lag; anicteric sclerae; pupils equal round and reactive to light Neck: Trachea midline; no palpable thyromegaly Lungs: Normal respiratory effort; no tactile  fremitus CV: Regular rate and rhythm; no palpable thrill; no pitting edema GI: Abdomen soft, nontender, nondistended; no palpable hepatosplenomegaly. Right inguinal hernia, possible femoral versus inguinal  MSK: Normal gait; no clubbing/cyanosis Psychiatric: Appropriate affect; alert and oriented 3 Lymphatic: No palpable cervical or axillary lymphadenopathy    Assessment & Plan Ralene Ok MD; 08/21/2019 2:18 PM)  FEMORAL HERNIA OF RIGHT SIDE (K41.90) Story: Ms. Hritz is a very pleasant 74yoF with HTN, early dementia whom has symptomatic reducible inguinofemoral hernia Impression: Patient is a 83 year old female with a right inguinal hernia, history of hypertension, Alzheimer's 1. The patient will like to proceed to the operating room for laparoscopic right inguinal hernia repair with mesh.  2. I discussed with the patient the signs and symptoms of incarceration and strangulation and the need to proceed to the ER should they occur.  3. I discussed with the patient the risks and benefits of the procedure to include but not limited to: Infection, bleeding, damage to surrounding structures, possible need for further surgery, possible nerve pain, and possible recurrence. The patient was understanding and wishes to proceed.  4. Patient is to follow-up with GI and has an appointment secondary to the terminal ileitis.

## 2019-08-29 NOTE — H&P (View-Only) (Signed)
History of Present Illness Ralene Ok MD; 08/21/2019 2:16 PM) The patient is a 83 year old female who presents with an inguinal hernia. Patient is an 83 year old female comes in with a history hypertension, Alzheimer's, who comes in with a right inguinal versus femoral hernia. Patient states this is been there for numerous years. She is unsure of the exact time. She states of the last 2 months she's had more pain than the right inguinal area. She states recently she laid down in size and felt it pop back in. Patient states that thereafter she's had less pain. She states that she has not had any signs or symptoms of strangulation.  Patient recently underwent CT scan. CT scan was significant for a "indirect right inguinal hernia detaining loop of distal ileum is also. Had a 4.5 cm segment of small bowel pushing the IC valve suspicious for terminal ileitis. I independently reviewed the CT scan. This possible at this portion of concerned small bowel near the IC valve could've been incarcerated into the hernia was reduced when she massaged it.  Patient had no previous abdominal surgery.   Allergies Sallyanne Kuster, CMA; 08/21/2019 1:54 PM) Dyes  Oral Contrast Allergies Reconciled   Medication History Sallyanne Kuster, CMA; 08/21/2019 1:55 PM) Donepezil HCl (10MG Tablet, Oral) Active. Lisinopril (20MG Tablet, Oral) Active. Memantine HCl (10MG Tablet, Oral) Active. Vitamin B-12 (1000MCG Tablet, 1 (one) Oral) Active. Vitamin D (1000UNIT Tablet, Oral) Active. hydroCHLOROthiazide (25MG Tablet, Oral) Active.    Review of Systems Ralene Ok, MD; 08/21/2019 2:18 PM) General Not Present- Appetite Loss, Chills, Fatigue, Fever, Night Sweats, Weight Gain and Weight Loss. Skin Not Present- Change in Wart/Mole, Dryness, Hives, Jaundice, New Lesions, Non-Healing Wounds, Rash and Ulcer. HEENT Not Present- Earache, Hearing Loss, Hoarseness, Nose Bleed, Oral Ulcers, Ringing in the Ears,  Seasonal Allergies, Sinus Pain, Sore Throat, Visual Disturbances, Wears glasses/contact lenses and Yellow Eyes. Respiratory Not Present- Bloody sputum, Chronic Cough, Difficulty Breathing, Snoring and Wheezing. Breast Not Present- Breast Mass, Breast Pain, Nipple Discharge and Skin Changes. Cardiovascular Not Present- Chest Pain, Difficulty Breathing Lying Down, Leg Cramps, Palpitations, Rapid Heart Rate, Shortness of Breath and Swelling of Extremities. Gastrointestinal Present- Abdominal Pain and Change in Bowel Habits. Not Present- Bloating, Bloody Stool, Chronic diarrhea, Constipation, Difficulty Swallowing, Excessive gas, Gets full quickly at meals, Hemorrhoids, Indigestion, Nausea, Rectal Pain and Vomiting. Female Genitourinary Not Present- Frequency, Nocturia, Painful Urination, Pelvic Pain and Urgency. Musculoskeletal Not Present- Back Pain, Joint Pain, Joint Stiffness, Muscle Pain, Muscle Weakness and Swelling of Extremities. Neurological Present- Decreased Memory. Not Present- Fainting, Headaches, Numbness, Seizures, Tingling, Tremor, Trouble walking and Weakness. Psychiatric Not Present- Anxiety, Bipolar, Change in Sleep Pattern, Depression, Fearful and Frequent crying. Hematology Not Present- Blood Thinners, Easy Bruising, Excessive bleeding, Gland problems, HIV and Persistent Infections.  Vitals Sallyanne Kuster CMA; 08/21/2019 1:56 PM) 08/21/2019 1:56 PM Weight: 143.5 lb Height: 60in Body Surface Area: 1.62 m Body Mass Index: 28.03 kg/m  Temp.: 97.101F  Pulse: 96 (Regular)  BP: 140/80(Sitting, Left Arm, Standard)       Physical Exam Ralene Ok MD; 08/21/2019 2:17 PM) The physical exam findings are as follows: Note: Constitutional: No acute distress; conversant; no deformities Eyes: Moist conjunctiva; no lid lag; anicteric sclerae; pupils equal round and reactive to light Neck: Trachea midline; no palpable thyromegaly Lungs: Normal respiratory effort; no tactile  fremitus CV: Regular rate and rhythm; no palpable thrill; no pitting edema GI: Abdomen soft, nontender, nondistended; no palpable hepatosplenomegaly. Right inguinal hernia, possible femoral versus inguinal  MSK: Normal gait; no clubbing/cyanosis Psychiatric: Appropriate affect; alert and oriented 3 Lymphatic: No palpable cervical or axillary lymphadenopathy    Assessment & Plan Ralene Ok MD; 08/21/2019 2:18 PM)  FEMORAL HERNIA OF RIGHT SIDE (K41.90) Story: Ms. Baskin is a very pleasant 64yoF with HTN, early dementia whom has symptomatic reducible inguinofemoral hernia Impression: Patient is a 83 year old female with a right inguinal hernia, history of hypertension, Alzheimer's 1. The patient will like to proceed to the operating room for laparoscopic right inguinal hernia repair with mesh.  2. I discussed with the patient the signs and symptoms of incarceration and strangulation and the need to proceed to the ER should they occur.  3. I discussed with the patient the risks and benefits of the procedure to include but not limited to: Infection, bleeding, damage to surrounding structures, possible need for further surgery, possible nerve pain, and possible recurrence. The patient was understanding and wishes to proceed.  4. Patient is to follow-up with GI and has an appointment secondary to the terminal ileitis.

## 2019-08-29 NOTE — Progress Notes (Signed)
SDW-pre-op call completed by pt spouse, [name] (DPR).  Spouse denies that pt C/O SOB and chest pain. Spouse denies that pt is under the care of a cardiologist. Spouse stated that pt PCP is Dr. Fara Olden. Spouse denies that pt had a stress test, echo and cardiac cath. Spouse denies recent labs. Spouse made aware to have pt stop taking  Aspirin (unless otherwise advised by surgeon), vitamins, fish oil and herbal medications. Do not take any NSAIDs ie: Ibuprofen, Advil, Naproxen (Aleve), Motrin, BC and Goody Powder. Spouse verbalized understanding of all pre-op instructions.

## 2019-08-30 ENCOUNTER — Other Ambulatory Visit: Payer: Self-pay

## 2019-08-30 ENCOUNTER — Ambulatory Visit (HOSPITAL_COMMUNITY): Payer: Medicare HMO | Admitting: Anesthesiology

## 2019-08-30 ENCOUNTER — Ambulatory Visit (HOSPITAL_COMMUNITY)
Admission: RE | Admit: 2019-08-30 | Discharge: 2019-08-30 | Disposition: A | Discharge status: Home / Self Care | Payer: Medicare HMO | Attending: General Surgery | Admitting: General Surgery

## 2019-08-30 ENCOUNTER — Encounter (HOSPITAL_COMMUNITY)
Admission: RE | Disposition: A | Discharge status: Home / Self Care | Payer: Self-pay | Source: Home / Self Care | Attending: General Surgery

## 2019-08-30 ENCOUNTER — Ambulatory Visit (HOSPITAL_BASED_OUTPATIENT_CLINIC_OR_DEPARTMENT_OTHER): Payer: Medicare HMO

## 2019-08-30 ENCOUNTER — Other Ambulatory Visit: Payer: Self-pay | Admitting: Cardiology

## 2019-08-30 ENCOUNTER — Encounter (HOSPITAL_COMMUNITY): Payer: Self-pay

## 2019-08-30 DIAGNOSIS — Z791 Long term (current) use of non-steroidal anti-inflammatories (NSAID): Secondary | ICD-10-CM | POA: Insufficient documentation

## 2019-08-30 DIAGNOSIS — R001 Bradycardia, unspecified: Secondary | ICD-10-CM | POA: Diagnosis not present

## 2019-08-30 DIAGNOSIS — I082 Rheumatic disorders of both aortic and tricuspid valves: Secondary | ICD-10-CM | POA: Insufficient documentation

## 2019-08-30 DIAGNOSIS — Z5309 Procedure and treatment not carried out because of other contraindication: Secondary | ICD-10-CM | POA: Diagnosis not present

## 2019-08-30 DIAGNOSIS — Z1159 Encounter for screening for other viral diseases: Secondary | ICD-10-CM | POA: Diagnosis not present

## 2019-08-30 DIAGNOSIS — R69 Illness, unspecified: Secondary | ICD-10-CM | POA: Diagnosis not present

## 2019-08-30 DIAGNOSIS — K4090 Unilateral inguinal hernia, without obstruction or gangrene, not specified as recurrent: Secondary | ICD-10-CM | POA: Diagnosis not present

## 2019-08-30 DIAGNOSIS — F419 Anxiety disorder, unspecified: Secondary | ICD-10-CM | POA: Diagnosis not present

## 2019-08-30 DIAGNOSIS — M1990 Unspecified osteoarthritis, unspecified site: Secondary | ICD-10-CM | POA: Insufficient documentation

## 2019-08-30 DIAGNOSIS — K5000 Crohn's disease of small intestine without complications: Secondary | ICD-10-CM | POA: Insufficient documentation

## 2019-08-30 DIAGNOSIS — Z87891 Personal history of nicotine dependence: Secondary | ICD-10-CM | POA: Insufficient documentation

## 2019-08-30 DIAGNOSIS — R9431 Abnormal electrocardiogram [ECG] [EKG]: Secondary | ICD-10-CM

## 2019-08-30 DIAGNOSIS — I10 Essential (primary) hypertension: Secondary | ICD-10-CM | POA: Insufficient documentation

## 2019-08-30 DIAGNOSIS — F0280 Dementia in other diseases classified elsewhere without behavioral disturbance: Secondary | ICD-10-CM | POA: Insufficient documentation

## 2019-08-30 DIAGNOSIS — Z79899 Other long term (current) drug therapy: Secondary | ICD-10-CM | POA: Diagnosis not present

## 2019-08-30 DIAGNOSIS — G309 Alzheimer's disease, unspecified: Secondary | ICD-10-CM | POA: Diagnosis not present

## 2019-08-30 HISTORY — DX: Unilateral inguinal hernia, without obstruction or gangrene, not specified as recurrent: K40.90

## 2019-08-30 HISTORY — DX: Presence of spectacles and contact lenses: Z97.3

## 2019-08-30 LAB — BASIC METABOLIC PANEL
Anion gap: 12 (ref 5–15)
BUN: 7 mg/dL — ABNORMAL LOW (ref 8–23)
CO2: 21 mmol/L — ABNORMAL LOW (ref 22–32)
Calcium: 9.2 mg/dL (ref 8.9–10.3)
Chloride: 102 mmol/L (ref 98–111)
Creatinine, Ser: 0.52 mg/dL (ref 0.44–1.00)
GFR calc Af Amer: >60 mL/min (ref >60)
GFR calc non Af Amer: >60 mL/min (ref >60)
Glucose, Bld: 102 mg/dL — ABNORMAL HIGH (ref 70–99)
Potassium: 4 mmol/L (ref 3.5–5.1)
Sodium: 135 mmol/L (ref 135–145)

## 2019-08-30 LAB — ECHOCARDIOGRAM COMPLETE
Height: 68 in
Weight: 2240 oz

## 2019-08-30 LAB — CBC
HCT: 45.1 % (ref 36.0–46.0)
Hemoglobin: 15.4 g/dL — ABNORMAL HIGH (ref 12.0–15.0)
MCH: 34 pg (ref 26.0–34.0)
MCHC: 34.1 g/dL (ref 30.0–36.0)
MCV: 99.6 fL (ref 80.0–100.0)
Platelets: 200 10*3/uL (ref 150–400)
RBC: 4.53 MIL/uL (ref 3.87–5.11)
RDW: 14 % (ref 11.5–15.5)
WBC: 5.5 10*3/uL (ref 4.0–10.5)
nRBC: 0 % (ref 0.0–0.2)

## 2019-08-30 SURGERY — CANCELLED PROCEDURE
Anesthesia: General | Laterality: Right

## 2019-08-30 MED ORDER — ACETAMINOPHEN 500 MG PO TABS
ORAL_TABLET | ORAL | Status: AC
  Administered 2019-08-30: 1000 mg via ORAL
  Filled 2019-08-30: qty 2

## 2019-08-30 MED ORDER — CHLORHEXIDINE GLUCONATE CLOTH 2 % EX PADS: 6.0000 | MEDICATED_PAD | Freq: Once | CUTANEOUS | Status: DC

## 2019-08-30 MED ORDER — FENTANYL CITRATE (PF) 100 MCG/2ML IJ SOLN: 25.0000 ug | INTRAMUSCULAR | Status: DC | PRN

## 2019-08-30 MED ORDER — MIDAZOLAM HCL 2 MG/2ML IJ SOLN: 0.5000 mg | Freq: Once | INTRAMUSCULAR | Status: DC | PRN

## 2019-08-30 MED ORDER — ROCURONIUM BROMIDE 50 MG/5ML IV SOSY
PREFILLED_SYRINGE | INTRAVENOUS | Status: DC | PRN
  Administered 2019-08-30: 50 mg via INTRAVENOUS

## 2019-08-30 MED ORDER — BUPIVACAINE HCL (PF) 0.25 % IJ SOLN
INTRAMUSCULAR | Status: AC
  Filled 2019-08-30: qty 30

## 2019-08-30 MED ORDER — FENTANYL CITRATE (PF) 250 MCG/5ML IJ SOLN
INTRAMUSCULAR | Status: AC
  Filled 2019-08-30: qty 5

## 2019-08-30 MED ORDER — MEPERIDINE HCL 25 MG/ML IJ SOLN: 6.2500 mg | INTRAMUSCULAR | Status: DC | PRN

## 2019-08-30 MED ORDER — CEFAZOLIN SODIUM-DEXTROSE 2-4 GM/100ML-% IV SOLN
2.0000 g | INTRAVENOUS | Status: AC
  Administered 2019-08-30: 2 g via INTRAVENOUS

## 2019-08-30 MED ORDER — ACETAMINOPHEN 500 MG PO TABS
1000.0000 mg | ORAL_TABLET | ORAL | Status: AC
  Administered 2019-08-30: 10:00:00 1000 mg via ORAL

## 2019-08-30 MED ORDER — PROPOFOL 10 MG/ML IV BOLUS
INTRAVENOUS | Status: DC | PRN
  Administered 2019-08-30: 100 mg via INTRAVENOUS

## 2019-08-30 MED ORDER — PROMETHAZINE HCL 25 MG/ML IJ SOLN: 6.2500 mg | INTRAMUSCULAR | Status: DC | PRN

## 2019-08-30 MED ORDER — GABAPENTIN 300 MG PO CAPS
ORAL_CAPSULE | ORAL | Status: AC
  Administered 2019-08-30: 300 mg via ORAL
  Filled 2019-08-30: qty 1

## 2019-08-30 MED ORDER — NITROGLYCERIN 0.2 MG/ML ON CALL CATH LAB
INTRAVENOUS | Status: DC | PRN
  Administered 2019-08-30: 20 ug via INTRAVENOUS

## 2019-08-30 MED ORDER — LIDOCAINE 2% (20 MG/ML) 5 ML SYRINGE
INTRAMUSCULAR | Status: DC | PRN
  Administered 2019-08-30: 40 mg via INTRAVENOUS

## 2019-08-30 MED ORDER — PROPOFOL 10 MG/ML IV BOLUS
INTRAVENOUS | Status: AC
  Filled 2019-08-30: qty 20

## 2019-08-30 MED ORDER — LACTATED RINGERS IV SOLN
INTRAVENOUS | Status: DC
  Administered 2019-08-30: 10:00:00 via INTRAVENOUS

## 2019-08-30 MED ORDER — GABAPENTIN 300 MG PO CAPS
300.0000 mg | ORAL_CAPSULE | ORAL | Status: AC
  Administered 2019-08-30: 10:00:00 300 mg via ORAL

## 2019-08-30 MED ORDER — CEFAZOLIN SODIUM-DEXTROSE 2-4 GM/100ML-% IV SOLN
INTRAVENOUS | Status: AC
  Filled 2019-08-30: qty 100

## 2019-08-30 MED ORDER — FENTANYL CITRATE (PF) 100 MCG/2ML IJ SOLN
INTRAMUSCULAR | Status: DC | PRN
  Administered 2019-08-30: 75 ug via INTRAVENOUS

## 2019-08-30 SURGICAL SUPPLY — 36 items
CANISTER SUCT 3000ML PPV (MISCELLANEOUS) IMPLANT
COVER SURGICAL LIGHT HANDLE (MISCELLANEOUS) IMPLANT
COVER WAND RF STERILE (DRAPES) IMPLANT
DEFOGGER SCOPE WARMER CLEARIFY (MISCELLANEOUS) IMPLANT
DERMABOND ADVANCED (GAUZE/BANDAGES/DRESSINGS)
DERMABOND ADVANCED .7 DNX12 (GAUZE/BANDAGES/DRESSINGS) IMPLANT
DISSECTOR BLUNT TIP ENDO 5MM (MISCELLANEOUS) IMPLANT
ELECT REM PT RETURN 9FT ADLT (ELECTROSURGICAL)
ELECTRODE REM PT RTRN 9FT ADLT (ELECTROSURGICAL) IMPLANT
GLOVE BIO SURGEON STRL SZ7.5 (GLOVE) IMPLANT
GOWN STRL REUS W/ TWL LRG LVL3 (GOWN DISPOSABLE) IMPLANT
GOWN STRL REUS W/ TWL XL LVL3 (GOWN DISPOSABLE) IMPLANT
GOWN STRL REUS W/TWL LRG LVL3 (GOWN DISPOSABLE)
GOWN STRL REUS W/TWL XL LVL3 (GOWN DISPOSABLE)
KIT BASIN OR (CUSTOM PROCEDURE TRAY) IMPLANT
KIT TURNOVER KIT B (KITS) IMPLANT
NEEDLE INSUFFLATION 14GA 120MM (NEEDLE) IMPLANT
NS IRRIG 1000ML POUR BTL (IV SOLUTION) IMPLANT
PAD ARMBOARD 7.5X6 YLW CONV (MISCELLANEOUS) IMPLANT
RELOAD STAPLE HERNIA 4.0 BLUE (INSTRUMENTS) IMPLANT
RELOAD STAPLE HERNIA 4.8 BLK (STAPLE) IMPLANT
SCISSORS LAP 5X35 DISP (ENDOMECHANICALS) IMPLANT
SET IRRIG TUBING LAPAROSCOPIC (IRRIGATION / IRRIGATOR) IMPLANT
SET TUBE SMOKE EVAC HIGH FLOW (TUBING) IMPLANT
STAPLER HERNIA 12 8.5 360D (INSTRUMENTS) IMPLANT
SUT MNCRL AB 4-0 PS2 18 (SUTURE) IMPLANT
SUT VIC AB 1 CT1 27 (SUTURE)
SUT VIC AB 1 CT1 27XBRD ANBCTR (SUTURE) IMPLANT
SYRINGE TOOMEY DISP (SYRINGE) IMPLANT
TOWEL GREEN STERILE (TOWEL DISPOSABLE) IMPLANT
TOWEL GREEN STERILE FF (TOWEL DISPOSABLE) IMPLANT
TRAY FOLEY W/BAG SLVR 14FR (SET/KITS/TRAYS/PACK) IMPLANT
TRAY LAPAROSCOPIC MC (CUSTOM PROCEDURE TRAY) IMPLANT
TROCAR CANNULA W/PORT DUAL 5MM (MISCELLANEOUS) IMPLANT
TROCAR XCEL 12X100 BLDLESS (ENDOMECHANICALS) IMPLANT
WATER STERILE IRR 1000ML POUR (IV SOLUTION) IMPLANT

## 2019-08-30 NOTE — Progress Notes (Signed)
Ms Sauerwein is a very pleasant 83yo with mild dementia, HTN, former smoker, who presents today for inguinal hernia repair by Dr. Rosendo Gros. On induction EKG monitoring she was noted to have deeply inverted T waves in II and V, possible ST depression in lead V. Her prior EKGs were unremarkable. A 12 lead EKG was obtained, showin only the T wave inversion laterally, no ST pertubation. She was hemodynamically very stable, yet given the acute change, she was awakened and taken to PACU for consultation by cardiology.     Discussed with Cardiology PA, patient, Dr. Rosendo Gros. Awaiting cardiology input.   Jenita Seashore, MD   [phone]

## 2019-08-30 NOTE — Progress Notes (Signed)
  Echocardiogram 2D Echocardiogram has been performed.  Alicia [name] 08/30/2019, 5:04 PM

## 2019-08-30 NOTE — Anesthesia Preprocedure Evaluation (Addendum)
Anesthesia Evaluation  Patient identified by MRN, date of birth, ID band Patient awake    Reviewed: Allergy & Precautions, NPO status , Patient's Chart, lab work & pertinent test results  History of Anesthesia Complications Negative for: history of anesthetic complications  Airway Mallampati: I  TM Distance: >3 FB Neck ROM: Full    Dental  (+) Caps, Dental Advisory Given   Pulmonary former smoker,  08/27/2019 SARS coronavirus neg   breath sounds clear to auscultation       Cardiovascular hypertension, Pt. on medications (-) angina Rhythm:Regular Rate:Normal     Neuro/Psych PSYCHIATRIC DISORDERS Anxiety Dementia Alzheimer'snegative neurological ROS     GI/Hepatic negative GI ROS, Neg liver ROS,   Endo/Other  negative endocrine ROS  Renal/GU negative Renal ROS     Musculoskeletal  (+) Arthritis ,   Abdominal   Peds  Hematology negative hematology ROS (+)   Anesthesia Other Findings   Reproductive/Obstetrics                            Anesthesia Physical Anesthesia Plan  ASA: II  Anesthesia Plan: General   Post-op Pain Management:    Induction: Intravenous  PONV Risk Score and Plan: 3 and Ondansetron, Dexamethasone and Treatment may vary due to age or medical condition  Airway Management Planned: Oral ETT  Additional Equipment:   Intra-op Plan:   Post-operative Plan: Extubation in OR  Informed Consent: I have reviewed the patients History and Physical, chart, labs and discussed the procedure including the risks, benefits and alternatives for the proposed anesthesia with the patient or authorized representative who has indicated his/her understanding and acceptance.     Dental advisory given  Plan Discussed with: CRNA and Surgeon  Anesthesia Plan Comments:         Anesthesia Quick Evaluation

## 2019-08-30 NOTE — Consult Note (Addendum)
Cardiology Consultation:   Patient ID: Alicia [name]; [id number]; 02/09/1936   Admit date: 08/30/2019 Date of Consult: 08/30/2019  Primary Care Provider: Leeroy Cha, MD Primary Cardiologist: New to [facility]   Patient Profile:   Alicia [name] is a 83 y.o. female with a hx of hypertension and early Alzheimer's who is being seen today for the evaluation of abnormal EKG at the request of Dr. Rosendo Gros.   History of Present Illness:   Alicia [name] is an 83 year old female with a history stated above who initially was seen 07/27/2019 in the emergency department for near syncopal episode with associated nausea and vomiting and right lower quadrant pain.  Symptoms at that time or thought to be related to dehydration due to ileitis. Symptoms improved while in the ED. CT of the abdomen and pelvis showed possible ileitis with mild diffuse small bowel dilation secondary to inflammation of the terminal ileum and no high-grade obstruction. Repeat CT on 08/09/2019 suspicious for Crohn's disease.  There were no findings of obstruction or overt strangulation however herniated loops of small bowel were thought to be possibly contributing to her symptoms. Given this, she was scheduled for hernia repair 08/30/2019.  On 08/30/2019 patient presented for elective right inguinal hernia repair with mesh. Unfortunately, prior to surgery EKG performed which showed significant T wave inversions in inferior and lateral leads and prolonged Qtc at 567m. Prior EKG on file from 07/27/2019 with NSR and no significant acute changes.  She is without anginal symptoms. She denies recent SOB, PND, chest pain/pressure, dizziness, orthopnea, LE swelling or syncope. She lives at home with her husband of greater than 60 years. She has no prior hx of CAD and has never had a cardiac workup in the past. Hx is moderately difficult to follow given pretty significant dementia.   Given the above, her surgery was canceled until further  cardiac evaluation.  Past Medical History:  Diagnosis Date  . [place] disease [place] 01/17/2019  . Anxiety   . Arthritis   . Frequent UTI    over the last year  . Hypertension   . Inguinal hernia    right  . Shingles   . Urinary frequency    over the year  . Urinary urgency   . Wears glasses     Past Surgical History:  Procedure Laterality Date  . BUNIONECTOMY     both feet  . EYE SURGERY Left 04-2013   cataract Left eye  . LUMBAR LAMINECTOMY N/A 10/14/2013   Procedure: MICRODISCECTOMY LUMBAR LAMINECTOMY/L4-5 Decompression, Excision Extradural Intraspinal Cyst;  Surgeon: MMarybelle Killings MD;  [facility]  Service: Orthopedics;  Laterality: N/A;  L4-5 Decompression, Excision Extradural Intraspinal Cyst     Prior to Admission medications   Medication Sig Start Date End Date Taking? Authorizing Provider  Cholecalciferol (VITAMIN D3) 50 MCG (2000 UT) TABS Take 2,000 Units by mouth daily.   Yes [provider]  donepezil (ARICEPT) 10 MG tablet TAKE 1 TABLET BY MOUTH EVERYDAY AT BEDTIME 08/29/19  Yes SSuzzanne Cloud NP  donepezil (ARICEPT) 5 MG tablet Take 1 tablet (5 mg total) by mouth at bedtime. 08/28/19  Yes SSuzzanne Cloud NP  ibuprofen (ADVIL) 200 MG tablet Take 400 mg by mouth every 8 (eight) hours as needed (for pain.).   Yes [provider]  lisinopril (PRINIVIL,ZESTRIL) 20 MG tablet Take 20 mg by mouth daily.   Yes [provider]  memantine (NAMENDA) 10 MG tablet Take 10 mg by mouth  2 (two) times daily. 01/10/19  Yes [provider]  Polyvinyl Alcohol-Povidone (REFRESH OP) Place 1 drop into both eyes daily.   Yes [provider]    Inpatient Medications: Scheduled Meds: . Chlorhexidine Gluconate Cloth  6 each Topical Once   And  . Chlorhexidine Gluconate Cloth  6 each Topical Once   Continuous Infusions: . lactated ringers 10 mL/hr at 08/30/19 1023   PRN Meds: fentaNYL (SUBLIMAZE) injection, meperidine (DEMEROL)  injection, midazolam, promethazine  Allergies:    Allergies  Allergen Reactions  . Contrast Media [Iodinated Diagnostic Agents]     unknown    Social History:   Social History   Socioeconomic History  . Marital status: Married    Spouse name: Not on file  . Number of children: Not on file  . Years of education: 2  . Highest education level: Some college, no degree  Occupational History  . Occupation: retired  Scientific laboratory technician  . Financial resource strain: Not on file  . Food insecurity    Worry: Not on file    Inability: Not on file  . Transportation needs    Medical: Not on file    Non-medical: Not on file  Tobacco Use  . Smoking status: Former Smoker    Types: Cigarettes  . Smokeless tobacco: Never Used  Substance and Sexual Activity  . Alcohol use: Yes    Comment: 1 glass of wine daily  . Drug use: No  . Sexual activity: Not Currently  Lifestyle  . Physical activity    Days per week: Not on file    Minutes per session: Not on file  . Stress: Not on file  Relationships  . Social Herbalist on phone: Not on file    Gets together: Not on file    Attends religious service: Not on file    Active member of club or organization: Not on file    Attends meetings of clubs or organizations: Not on file    Relationship status: Not on file  . Intimate partner violence    Fear of current or ex partner: Not on file    Emotionally abused: Not on file    Physically abused: Not on file    Forced sexual activity: Not on file  Other Topics Concern  . Not on file  Social History Narrative   Right handed   Lives at home with husband    Moderate caffeine usage     Family History:   Family History  Problem Relation Age of Onset  . High blood pressure Mother   . Diabetes Sister   . High blood pressure Sister   . Breast cancer Sister   . Colon cancer Neg Hx   . Esophageal cancer Neg Hx    Family Status:  Family Status  Relation Name Status  . Mother  Deceased   . Father  Deceased       unknown  . Sister  Deceased  . Sister  Deceased  . Neg Hx  (Not Specified)    ROS:  Please see the history of present illness.  All other ROS reviewed and negative.     Physical Exam/Data:   Vitals:   08/30/19 1003 08/30/19 1439 08/30/19 1457  BP: 128/80 (!) 142/97 (!) 140/93  Pulse: 84 (!) 55 (!) 58  Resp: 18 18 15   Temp: 97.7 F (36.5 C)    SpO2: 98% 100% 100%  Weight: 63.5 kg    Height: 5'  8" (1.727 m)      Intake/Output Summary (Last 24 hours) at 08/30/2019 1501 Last data filed at 08/30/2019 1421 Gross per 24 hour  Intake 700 ml  Output 0 ml  Net 700 ml   Filed Weights   08/30/19 1003  Weight: 63.5 kg   Body mass index is 21.29 kg/m. \  General: Elderly, pleasant, NAD Neck: Negative for carotid bruits. No JVD Lungs:Clear to ausculation bilaterally. No wheezes. Breathing is unlabored. Cardiovascular: RRR with S1 S2. No murmurs Abdomen: Soft, non-tender, non-distended. No obvious abdominal masses. Extremities: No edema. No clubbing or cyanosis. DP pulses 1+ bilaterally Neuro: Alert and oriented. No focal deficits. No facial asymmetry. MAE spontaneously. Psych: Responds to questions appropriately with normal affect.     EKG:  The EKG was personally reviewed and demonstrates: 07/31/2019 sinus bradycardia, TWI in inferior and lateral leads, prolonged QTC at 532 ms, new from previous tracing 07/27/2019 Telemetry:  Telemetry was personally reviewed and demonstrates: 08/30/2019 NSR/SB with HR in the 50s to 60s  Relevant CV Studies:  ECHO: None  CATH: None  Laboratory Data:  Chemistry Recent Labs  Lab 08/30/19 0955  NA 135  K 4.0  CL 102  CO2 21*  GLUCOSE 102*  BUN 7*  CREATININE 0.52  CALCIUM 9.2  GFRNONAA >60  GFRAA >60  ANIONGAP 12    Total Protein  Date Value Ref Range Status  07/27/2019 6.8 6.5 - 8.1 g/dL Final   Albumin  Date Value Ref Range Status  07/27/2019 4.0 3.5 - 5.0 g/dL Final   AST  Date Value Ref  Range Status  07/27/2019 19 15 - 41 U/L Final   ALT  Date Value Ref Range Status  07/27/2019 11 0 - 44 U/L Final   Alkaline Phosphatase  Date Value Ref Range Status  07/27/2019 44 38 - 126 U/L Final   Total Bilirubin  Date Value Ref Range Status  07/27/2019 2.0 (H) 0.3 - 1.2 mg/dL Final   Hematology Recent Labs  Lab 08/30/19 0955  WBC 5.5  RBC 4.53  HGB 15.4*  HCT 45.1  MCV 99.6  MCH 34.0  MCHC 34.1  RDW 14.0  PLT 200   Cardiac EnzymesNo results for input(s): TROPONINI in the last 168 hours. No results for input(s): TROPIPOC in the last 168 hours.  BNPNo results for input(s): BNP, PROBNP in the last 168 hours.  DDimer No results for input(s): DDIMER in the last 168 hours. TSH: No results found for: TSH Lipids:No results found for: CHOL, HDL, LDLCALC, LDLDIRECT, TRIG, CHOLHDL HgbA1c:No results found for: HGBA1C  Radiology/Studies:  No results found.  Assessment and Plan:   1.  Abnormal EKG: -Patient presented on10/12/2018 for elective right inguinal hernia repair with mesh. Unfortunately, prior to surgery EKG performed which showed significant T wave inversions in inferior and lateral leads and prolonged Qtc at 568m. Prior EKG on file from 07/27/2019 with NSR and no significant acute changes.   -Denies anginal symptoms -No prior hx of CAD and has never had a cardiac workup in the past.  -Will order STAT repeat EKG and echocardiogram to assess for WMA -If stable, the patient can be seen in the OP setting prior to surgery re-schedule.  -Would continue home antihypertensive -Consider ASA, no BB in the setting of asymptomatic bradycardia   2. Surgery for right inguinal hernia repair: -Initially was seen 07/27/2019 in the emergency department for near syncopal episode with associated nausea and vomiting and right lower quadrant pain.  Symptoms at that  time or thought to be related to dehydration due to ileitis. Symptoms improved while in the ED. CT of the abdomen and pelvis  showed possible ileitis with mild diffuse small bowel dilation secondary to inflammation of the terminal ileum and no high-grade obstruction. Repeat CT on 08/09/2019 suspicious for Crohn's disease.  There were no findings of obstruction or overt strangulation however herniated loops of small bowel were thought to be possibly contributing to her symptoms. Given this, she was scheduled for hernia repair 08/30/2019. -Per surgical team  -May need follow up with cardiology prior to re-schedule   3. HTN: -Elevated, 151/92>140/93>142/97 -Likely has not had home antihypertensives today  -Would restart if planning to admit overnight   For questions or updates, please contact [place] Please consult [email or web address] for contact info under Cardiology/STEMI.   SignedKathyrn Drown NP-C [place] Pager: [phone] 08/30/2019 3:01 PM  \

## 2019-08-30 NOTE — Interval H&P Note (Signed)
History and Physical Interval Note:  08/30/2019 1:03 PM  Alicia [name]  has presented today for surgery, with the diagnosis of RIGHT INGUINAL HERNIA.  The various methods of treatment have been discussed with the patient and family. After consideration of risks, benefits and other options for treatment, the patient has consented to  Procedure(s): LAPAROSCOPIC RIGHT INGUINAL HERNIA REPAIR WITH MESH (Right) as a surgical intervention.  The patient's history has been reviewed, patient examined, no change in status, stable for surgery.  I have reviewed the patient's chart and labs.  Questions were answered to the patient's satisfaction.     Ralene Ok

## 2019-08-30 NOTE — Op Note (Signed)
08/30/2019  4:02 PM  PATIENT:  Alicia [name]  83 y.o. female  PRE-OPERATIVE DIAGNOSIS:  RIGHT INGUINAL HERNIA  POST-OPERATIVE DIAGNOSIS:  * No post-op diagnosis entered *  PROCEDURE:  Procedure(s): LAPAROSCOPIC RIGHT INGUINAL HERNIA REPAIR WITH MESH (Right)  SURGEON:  Surgeon(s) and Role:    * Ralene Ok, MD - Primary  ANESTHESIA:   general  EBL:  0 mL   BLOOD ADMINISTERED:none  DRAINS: none   LOCAL MEDICATIONS USED:  NONE  SPECIMEN:  No Specimen  DISPOSITION OF SPECIMEN:  N/A  COUNTS:  YES  TOURNIQUET:  * No tourniquets in log *  DICTATION: .Dragon Dictation  Details of procedure: Patient is an 83 year old female who was brought in secondary to a right inguinal hernia.  Patient was consented and taken back to the OR.  Patient placed in supine position with bilateral SCDs in place.  Patient underwent general trach anesthesia.  Patient was prepped and draped in standard fashion.  A timeout was called and all facts verified.  At the time of the operating room staff passing instruments off to be connected to the laparoscopic tower anesthesia stated the patient was having EKG changes.  There was concern for possible ischemia.  At this time it was decided to cancel the case.  Patient was awakened from general anesthesia and was taken to the PACU in stable condition.  I discussed the findings and the change in status with the patient's husband. Cardiology to consult in the PACU.   PLAN OF CARE: Discharge to home after PACU  PATIENT DISPOSITION:  PACU - hemodynamically stable.   Delay start of Pharmacological VTE agent (>24hrs) due to surgical blood loss or risk of bleeding: not applicable

## 2019-08-30 NOTE — Anesthesia Procedure Notes (Signed)
Procedure Name: Intubation Date/Time: 08/30/2019 1:43 PM Performed by: Inda Coke, CRNA Pre-anesthesia Checklist: Patient identified, Emergency Drugs available, Suction available and Patient being monitored Patient Re-evaluated:Patient Re-evaluated prior to induction Oxygen Delivery Method: Circle System Utilized Preoxygenation: Pre-oxygenation with 100% oxygen Induction Type: IV induction Ventilation: Mask ventilation without difficulty Laryngoscope Size: Mac and 3 Grade View: Grade I Tube type: Oral Tube size: 7.0 mm Number of attempts: 1 Airway Equipment and Method: Stylet and Oral airway Placement Confirmation: ETT inserted through vocal cords under direct vision,  positive ETCO2 and breath sounds checked- equal and bilateral Secured at: 21 cm Tube secured with: Tape Dental Injury: Teeth and Oropharynx as per pre-operative assessment

## 2019-08-30 NOTE — Transfer of Care (Signed)
Immediate Anesthesia Transfer of Care Note  Patient: Alicia [name]  Procedure(s) Performed: LAPAROSCOPIC RIGHT INGUINAL HERNIA REPAIR WITH MESH (Right )  Patient Location: PACU  Anesthesia Type:General  Level of Consciousness: awake, alert  and oriented  Airway & Oxygen Therapy: Patient Spontanous Breathing and Patient connected to nasal cannula oxygen  Post-op Assessment: Report given to RN and Post -op Vital signs reviewed and stable  Post vital signs: Reviewed and stable  Last Vitals:  Vitals Value Taken Time  BP 139/84 08/30/19 1423  Temp    Pulse 61 08/30/19 1431  Resp 21 08/30/19 1431  SpO2 100 % 08/30/19 1431  Vitals shown include unvalidated device data.  Last Pain:  Vitals:   08/30/19 1016  PainSc: 0-No pain      Patients Stated Pain Goal: 3 (68/85/20 7409)  Complications: No apparent anesthesia complications

## 2019-08-30 NOTE — Anesthesia Postprocedure Evaluation (Signed)
Anesthesia Post Note  Patient: Bud Face  Procedure(s) Performed: LAPAROSCOPIC RIGHT INGUINAL HERNIA REPAIR WITH MESH (Right )     Patient location during evaluation: PACU Anesthesia Type: General Level of consciousness: awake and alert, patient cooperative and oriented Pain management: pain level controlled Vital Signs Assessment: post-procedure vital signs reviewed and stable Respiratory status: spontaneous breathing, nonlabored ventilation and respiratory function stable Cardiovascular status: blood pressure returned to baseline and stable Postop Assessment: no apparent nausea or vomiting Anesthetic complications: no Comments: Pt was noted to have T wave inversion on II, V monitor, VSS through induction, Pt awakened and taken to PACU. Pt denies chest pain, nausea, arm pain, and neurologically unchanged from Pre-op assessment. She has no complaints. Appreciate thorough cardiology input, await ECHO result, and will defer further management to them.     Last Vitals:  Vitals:   08/30/19 1545 08/30/19 1600  BP: (!) 129/93 (!) 148/94  Pulse: (!) 56 61  Resp: 18 20  Temp:    SpO2: 98% 99%    Last Pain:  Vitals:   08/30/19 1530  PainSc: 0-No pain                 Sukaina Toothaker,E. Gabbi Whetstone

## 2019-09-02 ENCOUNTER — Telehealth: Payer: Self-pay | Admitting: *Deleted

## 2019-09-02 ENCOUNTER — Encounter: Payer: Self-pay | Admitting: Cardiovascular Disease

## 2019-09-02 NOTE — Telephone Encounter (Signed)
Per Dr Audie Box, patient needs to be schedule lexiscan myoview and see him in a  One month . Dr Jenetta DownerNori Riis saw patient in hospital 10/ 2/20  patient appointment can be cancelled with Dr Harrell Gave on 10 /19/20 .   order has been placed by Tonny Branch. message been sent to scheduler

## 2019-09-05 ENCOUNTER — Telehealth: Payer: Self-pay | Admitting: Gastroenterology

## 2019-09-05 ENCOUNTER — Telehealth (HOSPITAL_COMMUNITY): Payer: Self-pay

## 2019-09-05 ENCOUNTER — Encounter: Payer: Self-pay | Admitting: Gastroenterology

## 2019-09-05 NOTE — Telephone Encounter (Signed)
Left a voicemail to call back and schedule Colonoscopy w/Dr Loletha Carrow and she will also need a previsit.

## 2019-09-05 NOTE — Telephone Encounter (Signed)
Encounter complete. 

## 2019-09-06 ENCOUNTER — Ambulatory Visit (HOSPITAL_COMMUNITY)
Admission: RE | Admit: 2019-09-06 | Discharge: 2019-09-06 | Disposition: A | Discharge status: Home / Self Care | Payer: Medicare HMO | Source: Ambulatory Visit | Attending: Cardiology | Admitting: Cardiology

## 2019-09-06 ENCOUNTER — Other Ambulatory Visit: Payer: Self-pay

## 2019-09-06 DIAGNOSIS — R9431 Abnormal electrocardiogram [ECG] [EKG]: Secondary | ICD-10-CM

## 2019-09-06 LAB — MYOCARDIAL PERFUSION IMAGING
LV dias vol: 83 mL (ref 46–106)
LV sys vol: 22 mL
Peak HR: 114 {beats}/min
Rest HR: 64 {beats}/min
SDS: 0
SRS: 2
SSS: 2
TID: 1.09

## 2019-09-06 MED ORDER — TECHNETIUM TC 99M TETROFOSMIN IV KIT
10.4000 | PACK | Freq: Once | INTRAVENOUS | Status: AC | PRN
  Administered 2019-09-06: 10.4 via INTRAVENOUS
  Filled 2019-09-06: qty 11

## 2019-09-06 MED ORDER — TECHNETIUM TC 99M TETROFOSMIN IV KIT
30.4000 | PACK | Freq: Once | INTRAVENOUS | Status: AC | PRN
  Administered 2019-09-06: 30.4 via INTRAVENOUS
  Filled 2019-09-06: qty 31

## 2019-09-06 MED ORDER — REGADENOSON 0.4 MG/5ML IV SOLN
0.4000 mg | Freq: Once | INTRAVENOUS | Status: AC
  Administered 2019-09-06: 0.4 mg via INTRAVENOUS

## 2019-09-09 ENCOUNTER — Encounter: Payer: Self-pay | Admitting: Cardiovascular Disease

## 2019-09-09 ENCOUNTER — Telehealth: Payer: Self-pay | Admitting: Cardiovascular Disease

## 2019-09-09 NOTE — Telephone Encounter (Signed)
Pre-op letter faxed to Dr. Rosendo Gros office

## 2019-09-09 NOTE — Telephone Encounter (Signed)
-----   Message from Tommie Raymond, NP sent at 09/07/2019  4:09 PM EDT -----  ----- Message ----- From: Sanda Klein, MD Sent: 09/06/2019   1:50 PM EDT To: Tommie Raymond, NP

## 2019-09-09 NOTE — Telephone Encounter (Signed)
Called [name]'s husband, [place] Crane, as she has dementia. Normal stress test. Updated him that she is ok to proceed with surgery. Will send note to Dr. Rosendo Gros that she is ok to proceed with surgery.   [place] T. Audie Box, [place]  [address] [place], [place] [address] [phone]  8:42 AM

## 2019-09-10 DIAGNOSIS — K4090 Unilateral inguinal hernia, without obstruction or gangrene, not specified as recurrent: Secondary | ICD-10-CM | POA: Diagnosis not present

## 2019-09-10 DIAGNOSIS — R1031 Right lower quadrant pain: Secondary | ICD-10-CM | POA: Diagnosis not present

## 2019-09-10 DIAGNOSIS — I10 Essential (primary) hypertension: Secondary | ICD-10-CM | POA: Diagnosis not present

## 2019-09-12 ENCOUNTER — Other Ambulatory Visit (HOSPITAL_COMMUNITY)
Admission: RE | Admit: 2019-09-12 | Discharge: 2019-09-12 | Disposition: A | Discharge status: Home / Self Care | Payer: Medicare HMO | Source: Ambulatory Visit | Attending: General Surgery | Admitting: General Surgery

## 2019-09-12 DIAGNOSIS — Z20828 Contact with and (suspected) exposure to other viral communicable diseases: Secondary | ICD-10-CM | POA: Diagnosis not present

## 2019-09-12 DIAGNOSIS — Z01812 Encounter for preprocedural laboratory examination: Secondary | ICD-10-CM | POA: Insufficient documentation

## 2019-09-13 ENCOUNTER — Ambulatory Visit: Payer: Self-pay | Admitting: General Surgery

## 2019-09-13 ENCOUNTER — Encounter (HOSPITAL_COMMUNITY): Payer: Self-pay | Admitting: Orthopedic Surgery

## 2019-09-13 NOTE — Progress Notes (Signed)
SDW pre-op call completed by pt's husband, [name] (DPR). Spouse denies pt having c/o sob, chest pain or fevers. Pt had a cardiologist consult while in the hospital a few weeks ago while in the hospital, ECHO performed on 08/30/2019 and had a stress test done on Monday. Surgical clearance sent to [facility]. Pt's husband was made aware to stop taking aspirin (unless otherwise advised by surgeon), all vitamins, fish oil and herbal medications. Husband was also advised to have patient to stop any and all NSAIDs such as advil, motrin, BC and Goody Powders. Pt's husband verbalized understanding of all pre-op instructions. All questions addressed

## 2019-09-14 LAB — NOVEL CORONAVIRUS, NAA (HOSP ORDER, SEND-OUT TO REF LAB; TAT 18-24 HRS): SARS-CoV-2, NAA: NOT DETECTED

## 2019-09-16 ENCOUNTER — Ambulatory Visit: Payer: Medicare HMO | Admitting: Cardiology

## 2019-09-16 ENCOUNTER — Ambulatory Visit (HOSPITAL_COMMUNITY): Payer: Medicare HMO | Admitting: Certified Registered"

## 2019-09-16 ENCOUNTER — Encounter (HOSPITAL_COMMUNITY): Payer: Self-pay | Admitting: General Practice

## 2019-09-16 ENCOUNTER — Other Ambulatory Visit: Payer: Self-pay

## 2019-09-16 ENCOUNTER — Ambulatory Visit (HOSPITAL_COMMUNITY)
Admission: RE | Admit: 2019-09-16 | Discharge: 2019-09-16 | Disposition: A | Discharge status: Home / Self Care | Payer: Medicare HMO | Attending: General Surgery | Admitting: General Surgery

## 2019-09-16 ENCOUNTER — Encounter (HOSPITAL_COMMUNITY)
Admission: RE | Disposition: A | Discharge status: Home / Self Care | Payer: Self-pay | Source: Home / Self Care | Attending: General Surgery

## 2019-09-16 DIAGNOSIS — Z87891 Personal history of nicotine dependence: Secondary | ICD-10-CM | POA: Insufficient documentation

## 2019-09-16 DIAGNOSIS — F419 Anxiety disorder, unspecified: Secondary | ICD-10-CM | POA: Insufficient documentation

## 2019-09-16 DIAGNOSIS — M1990 Unspecified osteoarthritis, unspecified site: Secondary | ICD-10-CM | POA: Diagnosis not present

## 2019-09-16 DIAGNOSIS — G309 Alzheimer's disease, unspecified: Secondary | ICD-10-CM | POA: Diagnosis not present

## 2019-09-16 DIAGNOSIS — I10 Essential (primary) hypertension: Secondary | ICD-10-CM | POA: Diagnosis not present

## 2019-09-16 DIAGNOSIS — Z79899 Other long term (current) drug therapy: Secondary | ICD-10-CM | POA: Diagnosis not present

## 2019-09-16 DIAGNOSIS — Z91041 Radiographic dye allergy status: Secondary | ICD-10-CM | POA: Diagnosis not present

## 2019-09-16 DIAGNOSIS — R69 Illness, unspecified: Secondary | ICD-10-CM | POA: Diagnosis not present

## 2019-09-16 DIAGNOSIS — K4090 Unilateral inguinal hernia, without obstruction or gangrene, not specified as recurrent: Secondary | ICD-10-CM | POA: Insufficient documentation

## 2019-09-16 DIAGNOSIS — F0280 Dementia in other diseases classified elsewhere without behavioral disturbance: Secondary | ICD-10-CM | POA: Insufficient documentation

## 2019-09-16 DIAGNOSIS — K5000 Crohn's disease of small intestine without complications: Secondary | ICD-10-CM | POA: Insufficient documentation

## 2019-09-16 DIAGNOSIS — I358 Other nonrheumatic aortic valve disorders: Secondary | ICD-10-CM | POA: Diagnosis not present

## 2019-09-16 HISTORY — PX: INGUINAL HERNIA REPAIR: SHX194

## 2019-09-16 LAB — POCT I-STAT, CHEM 8
BUN: 7 mg/dL — ABNORMAL LOW (ref 8–23)
Calcium, Ion: 1.18 mmol/L (ref 1.15–1.40)
Chloride: 99 mmol/L (ref 98–111)
Creatinine, Ser: 0.6 mg/dL (ref 0.44–1.00)
Glucose, Bld: 95 mg/dL (ref 70–99)
HCT: 45 % (ref 36.0–46.0)
Hemoglobin: 15.3 g/dL — ABNORMAL HIGH (ref 12.0–15.0)
Potassium: 4.2 mmol/L (ref 3.5–5.1)
Sodium: 136 mmol/L (ref 135–145)
TCO2: 28 mmol/L (ref 22–32)

## 2019-09-16 SURGERY — REPAIR, HERNIA, INGUINAL, LAPAROSCOPIC
Anesthesia: General | Site: Abdomen | Laterality: Right

## 2019-09-16 MED ORDER — PHENYLEPHRINE 40 MCG/ML (10ML) SYRINGE FOR IV PUSH (FOR BLOOD PRESSURE SUPPORT)
PREFILLED_SYRINGE | INTRAVENOUS | Status: AC
  Filled 2019-09-16: qty 10

## 2019-09-16 MED ORDER — SUGAMMADEX SODIUM 200 MG/2ML IV SOLN
INTRAVENOUS | Status: DC | PRN
  Administered 2019-09-16: 200 mg via INTRAVENOUS

## 2019-09-16 MED ORDER — BUPIVACAINE HCL 0.25 % IJ SOLN
INTRAMUSCULAR | Status: DC | PRN
  Administered 2019-09-16: 3 mL

## 2019-09-16 MED ORDER — SODIUM CHLORIDE (PF) 0.9 % IJ SOLN
INTRAMUSCULAR | Status: AC
  Filled 2019-09-16: qty 10

## 2019-09-16 MED ORDER — CHLORHEXIDINE GLUCONATE CLOTH 2 % EX PADS: 6.0000 | MEDICATED_PAD | Freq: Once | CUTANEOUS | Status: DC

## 2019-09-16 MED ORDER — TRAMADOL HCL 50 MG PO TABS: 50.0000 mg | ORAL_TABLET | Freq: Four times a day (QID) | ORAL | 0 refills | Status: AC | PRN

## 2019-09-16 MED ORDER — ACETAMINOPHEN 500 MG PO TABS
1000.0000 mg | ORAL_TABLET | ORAL | Status: AC
  Administered 2019-09-16: 1000 mg via ORAL
  Filled 2019-09-16: qty 2

## 2019-09-16 MED ORDER — CEFAZOLIN SODIUM-DEXTROSE 2-4 GM/100ML-% IV SOLN
2.0000 g | INTRAVENOUS | Status: AC
  Administered 2019-09-16: 2 g via INTRAVENOUS
  Filled 2019-09-16: qty 100

## 2019-09-16 MED ORDER — PROPOFOL 10 MG/ML IV BOLUS
INTRAVENOUS | Status: AC
  Filled 2019-09-16: qty 20

## 2019-09-16 MED ORDER — ROCURONIUM BROMIDE 10 MG/ML (PF) SYRINGE
PREFILLED_SYRINGE | INTRAVENOUS | Status: AC
  Filled 2019-09-16: qty 10

## 2019-09-16 MED ORDER — TRAMADOL HCL 50 MG PO TABS
ORAL_TABLET | ORAL | Status: AC
  Filled 2019-09-16: qty 1

## 2019-09-16 MED ORDER — PROPOFOL 10 MG/ML IV BOLUS
INTRAVENOUS | Status: DC | PRN
  Administered 2019-09-16: 20 mg via INTRAVENOUS
  Administered 2019-09-16: 110 mg via INTRAVENOUS

## 2019-09-16 MED ORDER — ONDANSETRON HCL 4 MG/2ML IJ SOLN
INTRAMUSCULAR | Status: AC
  Filled 2019-09-16: qty 4

## 2019-09-16 MED ORDER — TRAMADOL HCL 50 MG PO TABS
50.0000 mg | ORAL_TABLET | Freq: Four times a day (QID) | ORAL | Status: DC | PRN
  Administered 2019-09-16: 50 mg via ORAL

## 2019-09-16 MED ORDER — DEXAMETHASONE SODIUM PHOSPHATE 10 MG/ML IJ SOLN
INTRAMUSCULAR | Status: DC | PRN
  Administered 2019-09-16: 10 mg via INTRAVENOUS

## 2019-09-16 MED ORDER — ROCURONIUM BROMIDE 10 MG/ML (PF) SYRINGE
PREFILLED_SYRINGE | INTRAVENOUS | Status: DC | PRN
  Administered 2019-09-16: 70 mg via INTRAVENOUS

## 2019-09-16 MED ORDER — FENTANYL CITRATE (PF) 100 MCG/2ML IJ SOLN
INTRAMUSCULAR | Status: DC | PRN
  Administered 2019-09-16 (×2): 50 ug via INTRAVENOUS
  Administered 2019-09-16: 150 ug via INTRAVENOUS

## 2019-09-16 MED ORDER — LIDOCAINE 2% (20 MG/ML) 5 ML SYRINGE
INTRAMUSCULAR | Status: DC | PRN
  Administered 2019-09-16: 100 mg via INTRAVENOUS

## 2019-09-16 MED ORDER — FENTANYL CITRATE (PF) 100 MCG/2ML IJ SOLN
25.0000 ug | INTRAMUSCULAR | Status: DC | PRN
  Administered 2019-09-16 (×2): 25 ug via INTRAVENOUS

## 2019-09-16 MED ORDER — LACTATED RINGERS IV SOLN
INTRAVENOUS | Status: DC
  Administered 2019-09-16 (×2): via INTRAVENOUS

## 2019-09-16 MED ORDER — FENTANYL CITRATE (PF) 250 MCG/5ML IJ SOLN
INTRAMUSCULAR | Status: AC
  Filled 2019-09-16: qty 5

## 2019-09-16 MED ORDER — ONDANSETRON HCL 4 MG/2ML IJ SOLN
INTRAMUSCULAR | Status: DC | PRN
  Administered 2019-09-16: 4 mg via INTRAVENOUS

## 2019-09-16 MED ORDER — ARTIFICIAL TEARS OPHTHALMIC OINT
TOPICAL_OINTMENT | OPHTHALMIC | Status: AC
  Filled 2019-09-16: qty 3.5

## 2019-09-16 MED ORDER — GLYCOPYRROLATE PF 0.2 MG/ML IJ SOSY
PREFILLED_SYRINGE | INTRAMUSCULAR | Status: AC
  Filled 2019-09-16: qty 1

## 2019-09-16 MED ORDER — STERILE WATER FOR IRRIGATION IR SOLN
Status: DC | PRN
  Administered 2019-09-16: 1000 mL

## 2019-09-16 MED ORDER — EPHEDRINE SULFATE-NACL 50-0.9 MG/10ML-% IV SOSY
PREFILLED_SYRINGE | INTRAVENOUS | Status: DC | PRN
  Administered 2019-09-16: 10 mg via INTRAVENOUS

## 2019-09-16 MED ORDER — FENTANYL CITRATE (PF) 100 MCG/2ML IJ SOLN
INTRAMUSCULAR | Status: AC
  Filled 2019-09-16: qty 2

## 2019-09-16 MED ORDER — MIDAZOLAM HCL 2 MG/2ML IJ SOLN
INTRAMUSCULAR | Status: AC
  Filled 2019-09-16: qty 2

## 2019-09-16 MED ORDER — DEXAMETHASONE SODIUM PHOSPHATE 10 MG/ML IJ SOLN
INTRAMUSCULAR | Status: AC
  Filled 2019-09-16: qty 2

## 2019-09-16 MED ORDER — 0.9 % SODIUM CHLORIDE (POUR BTL) OPTIME
TOPICAL | Status: DC | PRN
  Administered 2019-09-16: 1000 mL

## 2019-09-16 MED ORDER — BUPIVACAINE HCL (PF) 0.25 % IJ SOLN
INTRAMUSCULAR | Status: AC
  Filled 2019-09-16: qty 30

## 2019-09-16 SURGICAL SUPPLY — 39 items
CANISTER SUCT 3000ML PPV (MISCELLANEOUS) IMPLANT
COVER SURGICAL LIGHT HANDLE (MISCELLANEOUS) ×2 IMPLANT
COVER WAND RF STERILE (DRAPES) IMPLANT
DEFOGGER SCOPE WARMER CLEARIFY (MISCELLANEOUS) IMPLANT
DERMABOND ADVANCED (GAUZE/BANDAGES/DRESSINGS) ×1
DERMABOND ADVANCED .7 DNX12 (GAUZE/BANDAGES/DRESSINGS) ×1 IMPLANT
DISSECTOR BLUNT TIP ENDO 5MM (MISCELLANEOUS) IMPLANT
ELECT REM PT RETURN 9FT ADLT (ELECTROSURGICAL) ×2
ELECTRODE REM PT RTRN 9FT ADLT (ELECTROSURGICAL) ×1 IMPLANT
GLOVE BIO SURGEON STRL SZ7.5 (GLOVE) ×2 IMPLANT
GLOVE SURG SS PI 6.5 STRL IVOR (GLOVE) ×2 IMPLANT
GOWN STRL REUS W/ TWL LRG LVL3 (GOWN DISPOSABLE) ×2 IMPLANT
GOWN STRL REUS W/ TWL XL LVL3 (GOWN DISPOSABLE) ×1 IMPLANT
GOWN STRL REUS W/TWL LRG LVL3 (GOWN DISPOSABLE) ×2
GOWN STRL REUS W/TWL XL LVL3 (GOWN DISPOSABLE) ×1
KIT BASIN OR (CUSTOM PROCEDURE TRAY) ×2 IMPLANT
KIT TURNOVER KIT B (KITS) ×2 IMPLANT
MESH 3DMAX 5X7 RT XLRG (Mesh General) ×2 IMPLANT
NEEDLE INSUFFLATION 14GA 120MM (NEEDLE) IMPLANT
NS IRRIG 1000ML POUR BTL (IV SOLUTION) ×2 IMPLANT
PAD ARMBOARD 7.5X6 YLW CONV (MISCELLANEOUS) ×4 IMPLANT
RELOAD STAPLE HERNIA 4.0 BLUE (INSTRUMENTS) ×2 IMPLANT
RELOAD STAPLE HERNIA 4.8 BLK (STAPLE) IMPLANT
SCISSORS LAP 5X35 DISP (ENDOMECHANICALS) ×2 IMPLANT
SET IRRIG TUBING LAPAROSCOPIC (IRRIGATION / IRRIGATOR) IMPLANT
SET TUBE SMOKE EVAC HIGH FLOW (TUBING) ×2 IMPLANT
STAPLER HERNIA 12 8.5 360D (INSTRUMENTS) ×2 IMPLANT
SUT MNCRL AB 4-0 PS2 18 (SUTURE) ×2 IMPLANT
SUT VIC AB 1 CT1 27 (SUTURE)
SUT VIC AB 1 CT1 27XBRD ANBCTR (SUTURE) IMPLANT
SYRINGE TOOMEY DISP (SYRINGE) IMPLANT
TOWEL GREEN STERILE (TOWEL DISPOSABLE) ×2 IMPLANT
TOWEL GREEN STERILE FF (TOWEL DISPOSABLE) ×2 IMPLANT
TRAY FOLEY W/BAG SLVR 14FR (SET/KITS/TRAYS/PACK) IMPLANT
TRAY LAPAROSCOPIC MC (CUSTOM PROCEDURE TRAY) ×2 IMPLANT
TROCAR CANNULA W/PORT DUAL 5MM (MISCELLANEOUS) ×2 IMPLANT
TROCAR OPTICAL SHORT 5MM (TROCAR) ×2 IMPLANT
TROCAR XCEL 12X100 BLDLESS (ENDOMECHANICALS) ×2 IMPLANT
WATER STERILE IRR 1000ML POUR (IV SOLUTION) ×2 IMPLANT

## 2019-09-16 NOTE — Interval H&P Note (Signed)
History and Physical Interval Note: Pt was worked up by cardiology for heart dysrhythmia introp on last case. Pt was cleared by Cards.  09/16/2019 1:06 PM  Alicia [name]  has presented today for surgery, with the diagnosis of RIGHT INGUINAL HERNIA.  The various methods of treatment have been discussed with the patient and family. After consideration of risks, benefits and other options for treatment, the patient has consented to  Procedure(s): LAPAROSCOPIC RIGHT INGUINAL HERNIA REPAIR WITH MESH (Right) as a surgical intervention.  The patient's history has been reviewed, patient examined, no change in status, stable for surgery.  I have reviewed the patient's chart and labs.  Questions were answered to the patient's satisfaction.     Ralene Ok

## 2019-09-16 NOTE — Op Note (Signed)
09/16/2019  2:50 PM  PATIENT:  Alicia [name]  83 y.o. female  PRE-OPERATIVE DIAGNOSIS:  RIGHT INGUINAL HERNIA  POST-OPERATIVE DIAGNOSIS:  RIGHT INDIRECT INGUINAL HERNIA  PROCEDURE:  Procedure(s): LAPAROSCOPIC RIGHT INGUINAL HERNIA REPAIR WITH MESH (Right)  SURGEON:  Surgeon(s) and Role:    * Alicia Ok, MD - Primary  ANESTHESIA:   local and general  EBL:  minimal   BLOOD ADMINISTERED:none  DRAINS: none   LOCAL MEDICATIONS USED:  BUPIVICAINE   SPECIMEN:  No Specimen  DISPOSITION OF SPECIMEN:  N/A  COUNTS:  YES  TOURNIQUET:  * No tourniquets in log *  DICTATION: .Dragon Dictation   Counts: reported as correct x 2  Findings:  The patient had a small right indirect hernia  Indications for procedure:  The patient is a 83 year old female with a right hernia for several months. Patient complained of symptomatology to her right inguinal area. The patient was taken back for elective inguinal hernia repair.  Details of the procedure: The patient was taken back to the operating room. The patient was placed in supine position with bilateral SCDs in place.  The patient was prepped and draped in the usual sterile fashion.  After appropriate anitbiotics were confirmed, a time-out was confirmed and all facts were verified.  0.25% Marcaine was used to infiltrate the umbilical area. A 11-blade was used to cut down the skin and blunt dissection was used to get the anterior fashion.  The anterior fascia was incised approximately 1 cm and the muscles were retracted laterally. Blunt dissection was then used to create a space in the preperitoneal area. At this time a 10 mm camera was then introduced into the space and advanced the pubic tubercle and a 12 mm trocar was placed over this and insufflation was started.  At this time and space was created from medial to laterally the preperitoneal space.  Cooper's ligament was initially cleaned off.  The hernia sac was identified in the  indirect space. Dissection of the hernia sac was undertaken.  The round ligament was cauterized and divided.  Once the hernia sac was taken down to approximately the umbilicus a Bard 3D Max mesh, size: Rachelle Hora, was  introduced into the preperitoneal space.  The mesh was brought over to cover the direct and indirect hernia spaces.  This was anchored into place and secured to Cooper's ligament with 4.91m staples from a Coviden hernia stapler. It was anchored to the anterior abdominal wall with 4.8 mm staples. The hernia sac was seen lying posterior to the mesh. There was no staples placed laterally. The insufflation was evacuated and the peritoneum was seen posterior to the mesh. The trochars were removed. The anterior fascia was reapproximated using #1 Vicryl on a UR- 6.  Intra-abdominal air was evacuated and the Veress needle removed. The skin was reapproximated using 4-0 Monocryl subcuticular fashion and dressed with Dermbond.  The patient was awakened from general anesthesia and taken to recovery in stable condition.   PLAN OF CARE: Discharge to home after PACU  PATIENT DISPOSITION:  PACU - hemodynamically stable.   Delay start of Pharmacological VTE agent (>24hrs) due to surgical blood loss or risk of bleeding: not applicable

## 2019-09-16 NOTE — Discharge Instructions (Signed)
[facility]  HERNIA REPAIR: POST OP INSTRUCTIONS  Always review your discharge instruction sheet given to you by the facility where your surgery was performed. IF YOU HAVE DISABILITY OR FAMILY LEAVE FORMS, YOU MUST BRING THEM TO THE OFFICE FOR PROCESSING.   DO NOT GIVE THEM TO YOUR DOCTOR.  1. A  prescription for pain medication may be given to you upon discharge.  Take your pain medication as prescribed, if needed.  If narcotic pain medicine is not needed, then you may take acetaminophen (Tylenol) or ibuprofen (Advil) as needed. 2. Take your usually prescribed medications unless otherwise directed. If you need a refill on your pain medication, please contact your pharmacy.  They will contact our office to request authorization. Prescriptions will not be filled after 5 pm or on week-ends. 3. You should follow a light diet the first 24 hours after arrival home, such as soup and crackers, etc.  Be sure to include lots of fluids daily.  Resume your normal diet the day after surgery. 4.Most patients will experience some swelling and bruising around the umbilicus or in the groin and scrotum.  Ice packs and reclining will help.  Swelling and bruising can take several days to resolve.  6. It is common to experience some constipation if taking pain medication after surgery.  Increasing fluid intake and taking a stool softener (such as Colace) will usually help or prevent this problem from occurring.  A mild laxative (Milk of Magnesia or Miralax) should be taken according to package directions if there are no bowel movements after 48 hours. 7. Unless discharge instructions indicate otherwise, you may remove your bandages 24-48 hours after surgery, and you may shower at that time.  You may have steri-strips (small skin tapes) in place directly over the incision.  These strips should be left on the skin for 7-10 days.  If your surgeon used skin glue on the incision, you may shower in  24 hours.  The glue will flake off over the next 2-3 weeks.  Any sutures or staples will be removed at the office during your follow-up visit. 8. ACTIVITIES:  You may resume regular (light) daily activities beginning the next day--such as daily self-care, walking, climbing stairs--gradually increasing activities as tolerated.  You may have sexual intercourse when it is comfortable.  Refrain from any heavy lifting or straining until approved by your doctor.  a.You may drive when you are no longer taking prescription pain medication, you can comfortably wear a seatbelt, and you can safely maneuver your car and apply brakes. b.RETURN TO WORK:   _____________________________________________  9.You should see your doctor in the office for a follow-up appointment approximately 2-3 weeks after your surgery.  Make sure that you call for this appointment within a day or two after you arrive home to insure a convenient appointment time. 10.OTHER INSTRUCTIONS: _________________________    _____________________________________  WHEN TO CALL YOUR DOCTOR: 1. Fever over 101.0 2. Inability to urinate 3. Nausea and/or vomiting 4. Extreme swelling or bruising 5. Continued bleeding from incision. 6. Increased pain, redness, or drainage from the incision  The clinic staff is available to answer your questions during regular business hours.  Please dont hesitate to call and ask to speak to one of the nurses for clinical concerns.  If you have a medical emergency, go to the nearest emergency room or call 911.  A surgeon from [facility] is always on call at the hospital   [address]  [address], [place], [place], [place]  [address] ?  [address] [place], [place], [place]   [address] [phone] ? [phone] ? FAX [phone] Web site: [email or web address]

## 2019-09-16 NOTE — Progress Notes (Signed)
50 mcgs of fentanyl wasted in pyxis room with Orie Fisherman, RN. Unable to waste in the pyxis due to patients discharge.  Rowe Pavy, RN

## 2019-09-16 NOTE — Anesthesia Preprocedure Evaluation (Addendum)
Anesthesia Evaluation  Patient identified by MRN, date of birth, ID band  Reviewed: Allergy & Precautions, NPO status , Patient's Chart, lab work & pertinent test results  Airway Mallampati: I  TM Distance: >3 FB Neck ROM: Full    Dental no notable dental hx. (+) Dental Advisory Given, Chipped,    Pulmonary neg pulmonary ROS, former smoker,    Pulmonary exam normal breath sounds clear to auscultation       Cardiovascular hypertension, Normal cardiovascular exam Rhythm:Regular Rate:Normal  09/06/19 Nuclear stress EF: 73%. The left ventricular ejection fraction is hyperdynamic (>65%). The study is normal. This is a low risk study. There was no ST segment deviation noted during stress. No T wave inversion was noted during stress. Low risk stress nuclear study with normal perfusion and normal left ventricular regional and global systolic function.  TTE 08/2019  1. Left ventricular ejection fraction, by visual estimation, is 55 to 60%. The left ventricle has normal function. There is moderately increased left ventricular hypertrophy.  2. Left ventricular diastolic Doppler parameters are consistent with impaired relaxation pattern of LV diastolic filling.  3. Global right ventricle has normal systolic function.The right ventricular size is normal. No increase in right ventricular wall thickness.  4. Left atrial size was normal.  5. Right atrial size was normal.  6. The mitral valve is abnormal. Trace mitral valve regurgitation.  7. The tricuspid valve is grossly normal. Tricuspid valve regurgitation is trivial.  8. The aortic valve is tricuspid Aortic valve regurgitation is trivial by color flow Doppler. Mild aortic valve sclerosis without stenosis.  9. The pulmonic valve was grossly normal. Pulmonic valve regurgitation is not visualized by color flow Doppler. 10. Aortic dilatation noted. 11. There is mild dilatation of the ascending  aorta measuring 40 mm. 12. The inferior vena cava is normal in size with greater than 50% respiratory variability, suggesting right atrial pressure of 3 mmHg.   Neuro/Psych PSYCHIATRIC DISORDERS Anxiety Dementia negative neurological ROS     GI/Hepatic negative GI ROS, Neg liver ROS,   Endo/Other  negative endocrine ROS  Renal/GU negative Renal ROS  negative genitourinary   Musculoskeletal  (+) Arthritis ,   Abdominal   Peds  Hematology negative hematology ROS (+)   Anesthesia Other Findings Surgery cancelled on 08/30/19 2/2 T wave inversions after induction. Cardiac workup since then negative.   Reproductive/Obstetrics                           Anesthesia Physical Anesthesia Plan  ASA: III  Anesthesia Plan: General   Post-op Pain Management:    Induction: Intravenous  PONV Risk Score and Plan: Dexamethasone, Ondansetron and Treatment may vary due to age or medical condition  Airway Management Planned: Oral ETT  Additional Equipment:   Intra-op Plan:   Post-operative Plan: Extubation in OR  Informed Consent: I have reviewed the patients History and Physical, chart, labs and discussed the procedure including the risks, benefits and alternatives for the proposed anesthesia with the patient or authorized representative who has indicated his/her understanding and acceptance.     Dental advisory given  Plan Discussed with: CRNA  Anesthesia Plan Comments:         Anesthesia Quick Evaluation

## 2019-09-16 NOTE — Transfer of Care (Signed)
Immediate Anesthesia Transfer of Care Note  Patient: Alicia [name]  Procedure(s) Performed: LAPAROSCOPIC RIGHT INGUINAL HERNIA REPAIR WITH MESH (Right Abdomen)  Patient Location: PACU  Anesthesia Type:General  Level of Consciousness: awake, alert  and oriented  Airway & Oxygen Therapy: Patient Spontanous Breathing and Patient connected to [name] mask oxygen  Post-op Assessment: Report given to RN and Post -op Vital signs reviewed and stable  Post vital signs: Reviewed and stable  Last Vitals:  Vitals Value Taken Time  BP 133/84 09/16/19 1500  Temp    Pulse 95 09/16/19 1501  Resp 16 09/16/19 1501  SpO2 100 % 09/16/19 1501  Vitals shown include unvalidated device data.  Last Pain:  Vitals:   09/16/19 1038  TempSrc: Oral         Complications: No apparent anesthesia complications

## 2019-09-16 NOTE — Anesthesia Procedure Notes (Signed)

## 2019-09-17 ENCOUNTER — Encounter (HOSPITAL_COMMUNITY): Payer: Self-pay | Admitting: General Surgery

## 2019-09-18 NOTE — Anesthesia Postprocedure Evaluation (Signed)
Anesthesia Post Note  Patient: Alicia [name]  Procedure(s) Performed: LAPAROSCOPIC RIGHT INGUINAL HERNIA REPAIR WITH MESH (Right Abdomen)     Patient location during evaluation: PACU Anesthesia Type: General Level of consciousness: awake and sedated Pain management: pain level controlled Vital Signs Assessment: post-procedure vital signs reviewed and stable Respiratory status: spontaneous breathing Cardiovascular status: stable Postop Assessment: no apparent nausea or vomiting Anesthetic complications: no    Last Vitals:  Vitals:   09/16/19 1530 09/16/19 1545  BP: (!) 129/91 (!) 140/97  Pulse: 67 62  Resp: 20 17  Temp:  (!) 36.4 C  SpO2: 100% 100%    Last Pain:  Vitals:   09/16/19 1545  TempSrc:   PainSc: 0-No pain   Pain Goal:                   Huston Foley

## 2019-10-03 ENCOUNTER — Other Ambulatory Visit (HOSPITAL_COMMUNITY): Payer: Medicare HMO

## 2019-10-17 ENCOUNTER — Ambulatory Visit: Payer: Medicare HMO | Admitting: Cardiovascular Disease

## 2019-10-17 DIAGNOSIS — R69 Illness, unspecified: Secondary | ICD-10-CM | POA: Diagnosis not present

## 2019-10-22 ENCOUNTER — Encounter: Payer: Medicare HMO | Admitting: Gastroenterology

## 2019-11-01 DIAGNOSIS — I700 Atherosclerosis of aorta: Secondary | ICD-10-CM | POA: Diagnosis not present

## 2019-11-01 DIAGNOSIS — I10 Essential (primary) hypertension: Secondary | ICD-10-CM | POA: Diagnosis not present

## 2019-11-01 DIAGNOSIS — Z1389 Encounter for screening for other disorder: Secondary | ICD-10-CM | POA: Diagnosis not present

## 2019-11-01 DIAGNOSIS — E871 Hypo-osmolality and hyponatremia: Secondary | ICD-10-CM | POA: Diagnosis not present

## 2019-11-01 DIAGNOSIS — Z0000 Encounter for general adult medical examination without abnormal findings: Secondary | ICD-10-CM | POA: Diagnosis not present

## 2019-11-01 DIAGNOSIS — E785 Hyperlipidemia, unspecified: Secondary | ICD-10-CM | POA: Diagnosis not present

## 2019-11-01 DIAGNOSIS — Z79899 Other long term (current) drug therapy: Secondary | ICD-10-CM | POA: Diagnosis not present

## 2019-11-01 DIAGNOSIS — R69 Illness, unspecified: Secondary | ICD-10-CM | POA: Diagnosis not present

## 2020-01-01 DIAGNOSIS — R69 Illness, unspecified: Secondary | ICD-10-CM | POA: Diagnosis not present

## 2020-01-27 ENCOUNTER — Ambulatory Visit: Payer: Self-pay | Admitting: General Surgery

## 2020-02-11 DIAGNOSIS — I10 Essential (primary) hypertension: Secondary | ICD-10-CM | POA: Diagnosis not present

## 2020-02-11 DIAGNOSIS — E785 Hyperlipidemia, unspecified: Secondary | ICD-10-CM | POA: Diagnosis not present

## 2020-02-11 DIAGNOSIS — E782 Mixed hyperlipidemia: Secondary | ICD-10-CM | POA: Diagnosis not present

## 2020-02-11 DIAGNOSIS — R69 Illness, unspecified: Secondary | ICD-10-CM | POA: Diagnosis not present

## 2020-02-11 DIAGNOSIS — E7800 Pure hypercholesterolemia, unspecified: Secondary | ICD-10-CM | POA: Diagnosis not present

## 2020-02-11 DIAGNOSIS — G301 Alzheimer's disease with late onset: Secondary | ICD-10-CM | POA: Diagnosis not present

## 2020-02-25 ENCOUNTER — Other Ambulatory Visit: Payer: Self-pay

## 2020-02-25 ENCOUNTER — Encounter: Payer: Self-pay | Admitting: Neurology

## 2020-02-25 ENCOUNTER — Ambulatory Visit: Payer: Medicare HMO | Admitting: Neurology

## 2020-02-25 VITALS — BP 124/82 | HR 79 | Temp 97.5°F | Ht 66.0 in | Wt 143.0 lb

## 2020-02-25 DIAGNOSIS — F0280 Dementia in other diseases classified elsewhere without behavioral disturbance: Secondary | ICD-10-CM | POA: Diagnosis not present

## 2020-02-25 DIAGNOSIS — G301 Alzheimer's disease with late onset: Secondary | ICD-10-CM

## 2020-02-25 DIAGNOSIS — R69 Illness, unspecified: Secondary | ICD-10-CM | POA: Diagnosis not present

## 2020-02-25 MED ORDER — DONEPEZIL HCL 5 MG PO TABS: 5.0000 mg | ORAL_TABLET | Freq: Every day | ORAL | 3 refills | Status: DC

## 2020-02-25 NOTE — Patient Instructions (Signed)
Continue taking Aricept and Namenda  See you back in 1 year  Consider evaluation with Speech Therapy

## 2020-02-25 NOTE — Progress Notes (Signed)
PATIENT: Bud Face DOB: 1936-06-17  REASON FOR VISIT: follow up HISTORY FROM: patient  HISTORY OF PRESENT ILLNESS: Today 02/25/20 Ms. Crunkleton is an 84 year old female with history of memory disturbance and word finding difficulty.  She remains on Aricept and Namenda.  She is on Aricept 5 mg, due to report of dizziness. Since last seen, memory has remained the same, she has continued difficulties with word finding and expressing herself.  She remains quite active, cooks 3 meals a day, does all of her own ADLs, and housework. She does her own medications.  She continues to drive only short distances, her husband usually does the driving.  She denies any falls, reports sleeps very well.  She says she is overall very happy. She presents today accompanied by her husband.  HISTORY 08/28/2019 SS: Ms. Quinones is an 84 year old female with history of memory disturbance and word finding difficulty.  She is here with her husband today.  She reports she continues to have difficulty with word finding.  She is still quite active.  She is able to perform all her own ADLs, and do cooking in the home.  She is even able to continue driving, but only short distances to places that she is familiar with.  She does manage her medications.  She denies any trouble with her walking or balance.  She has not had any falls.  She says with Covid, she has not been able to go out with her friends.  She does still talk on the phone with friends, and reports she may have difficulty finding the right words.  She remains on Namenda and Aricept.  She did have to decrease her dose of Aricept to 5 mg daily due to report of dizziness.  She is planning to have surgery in the next few days to have hernia repair.  She indicates she sleeps well at night.  She presents today for follow-up accompanied by her husband.   REVIEW OF SYSTEMS: Out of a complete 14 system review of symptoms, the patient complains only of the following symptoms,  and all other reviewed systems are negative.  Memory loss  ALLERGIES: Allergies  Allergen Reactions  . Contrast Media [Iodinated Diagnostic Agents]     unknown    HOME MEDICATIONS: Outpatient Medications Prior to Visit  Medication Sig Dispense Refill  . Cholecalciferol (VITAMIN D3) 50 MCG (2000 UT) TABS Take 2,000 Units by mouth daily.    Marland Kitchen donepezil (ARICEPT) 5 MG tablet Take 1 tablet (5 mg total) by mouth at bedtime. 30 tablet 6  . ibuprofen (ADVIL) 200 MG tablet Take 400 mg by mouth every 8 (eight) hours as needed (for pain.).    Marland Kitchen lisinopril (PRINIVIL,ZESTRIL) 20 MG tablet Take 20 mg by mouth daily.    . memantine (NAMENDA) 10 MG tablet Take 10 mg by mouth 2 (two) times daily.    . Polyvinyl Alcohol-Povidone (REFRESH OP) Place 1 drop into both eyes daily.    . rosuvastatin (CRESTOR) 5 MG tablet Take 5 mg by mouth at bedtime.    . traMADol (ULTRAM) 50 MG tablet Take 1 tablet (50 mg total) by mouth every 6 (six) hours as needed. 20 tablet 0  . donepezil (ARICEPT) 10 MG tablet TAKE 1 TABLET BY MOUTH EVERYDAY AT BEDTIME 90 tablet 1   No facility-administered medications prior to visit.    PAST MEDICAL HISTORY: Past Medical History:  Diagnosis Date  . Alzheimer disease ([place]) 01/17/2019  . Anxiety   . Arthritis   .  Frequent UTI    over the last year  . Hypertension   . Inguinal hernia    right  . Shingles   . Urinary frequency    over the year  . Urinary urgency   . Wears glasses     PAST SURGICAL HISTORY: Past Surgical History:  Procedure Laterality Date  . BUNIONECTOMY     both feet  . EYE SURGERY Left 04-2013   cataract Left eye  . INGUINAL HERNIA REPAIR Right 09/16/2019   Procedure: LAPAROSCOPIC RIGHT INGUINAL HERNIA REPAIR WITH MESH;  Surgeon: Ralene Ok, MD;  Location: [facility];  Service: General;  Laterality: Right;  . LUMBAR LAMINECTOMY N/A 10/14/2013   Procedure: MICRODISCECTOMY LUMBAR LAMINECTOMY/L4-5 Decompression, Excision Extradural Intraspinal Cyst;   Surgeon: Marybelle Killings, MD;  Location: [facility];  Service: Orthopedics;  Laterality: N/A;  L4-5 Decompression, Excision Extradural Intraspinal Cyst    FAMILY HISTORY: Family History  Problem Relation Age of Onset  . High blood pressure Mother   . Diabetes Sister   . High blood pressure Sister   . Breast cancer Sister   . Colon cancer Neg Hx   . Esophageal cancer Neg Hx     SOCIAL HISTORY: Social History   Socioeconomic History  . Marital status: Married    Spouse name: Not on file  . Number of children: Not on file  . Years of education: 2  . Highest education level: Some college, no degree  Occupational History  . Occupation: retired  Tobacco Use  . Smoking status: Former Smoker    Types: Cigarettes  . Smokeless tobacco: Never Used  Substance and Sexual Activity  . Alcohol use: Yes    Comment: 1 glass of wine daily  . Drug use: No  . Sexual activity: Not Currently  Other Topics Concern  . Not on file  Social History Narrative   Right handed   Lives at home with husband    Moderate caffeine usage    Social Determinants of Health   Financial Resource Strain:   . Difficulty of Paying Living Expenses:   Food Insecurity:   . Worried About Charity fundraiser in the Last Year:   . Arboriculturist in the Last Year:   Transportation Needs:   . Film/video editor (Medical):   Marland Kitchen Lack of Transportation (Non-Medical):   Physical Activity:   . Days of Exercise per Week:   . Minutes of Exercise per Session:   Stress:   . Feeling of Stress :   Social Connections:   . Frequency of Communication with Friends and Family:   . Frequency of Social Gatherings with Friends and Family:   . Attends Religious Services:   . Active Member of Clubs or Organizations:   . Attends Archivist Meetings:   Marland Kitchen Marital Status:   Intimate Partner Violence:   . Fear of Current or Ex-Partner:   . Emotionally Abused:   Marland Kitchen Physically Abused:   . Sexually Abused:    PHYSICAL  EXAM  Vitals:   02/25/20 1115  BP: 124/82  Pulse: 79  Temp: (!) 97.5 F (36.4 C)  Weight: 143 lb (64.9 kg)  Height: 5' 6"  (1.676 m)   Body mass index is 23.08 kg/m.  Generalized: Well developed, in no acute distress  MMSE - Mini Mental State Exam 02/25/2020 08/28/2019 08/28/2019  Not completed: - (No Data) Unable to complete  Orientation to time 3 1 -  Orientation to Place 2 1 -  Registration 2 3 -  Attention/ Calculation 0 0 -  Recall 1 0 -  Language- name 2 objects 2 2 -  Language- repeat 0 0 -  Language- follow 3 step command 3 3 -  Language- read & follow direction 0 1 -  Write a sentence 0 1 -  Copy design 0 1 -  Total score 13 13 -    Neurological examination  Mentation: Alert oriented to time, place, history taking, significant word finding difficulty, well-appearing, very pleasant, engaging, able to perform exam commands fairly well  Cranial nerve II-XII: Pupils were equal round reactive to light. Extraocular movements were full, visual field were full on confrontational test. Facial sensation and strength were normal.  Head turning and shoulder shrug  were normal and symmetric. Motor: The motor testing reveals 5 over 5 strength of all 4 extremities. Good symmetric motor tone is noted throughout.  Sensory: Sensory testing is intact to soft touch on all 4 extremities. No evidence of extinction is noted.  Coordination: Cerebellar testing reveals good finger-nose-finger and heel-to-shin bilaterally, slight apraxia performing these tasks Gait and station: Gait is normal. Tandem gait is normal.   Reflexes: Deep tendon reflexes are symmetric and normal bilaterally.   DIAGNOSTIC DATA (LABS, IMAGING, TESTING) - I reviewed patient records, labs, notes, testing and imaging myself where available.  Lab Results  Component Value Date   WBC 5.5 08/30/2019   HGB 15.3 (H) 09/16/2019   HCT 45.0 09/16/2019   MCV 99.6 08/30/2019   PLT 200 08/30/2019      Component Value  Date/Time   NA 136 09/16/2019 1115   K 4.2 09/16/2019 1115   CL 99 09/16/2019 1115   CO2 21 (L) 08/30/2019 0955   GLUCOSE 95 09/16/2019 1115   BUN 7 (L) 09/16/2019 1115   CREATININE 0.60 09/16/2019 1115   CALCIUM 9.2 08/30/2019 0955   PROT 6.8 07/27/2019 0350   ALBUMIN 4.0 07/27/2019 0350   AST 19 07/27/2019 0350   ALT 11 07/27/2019 0350   ALKPHOS 44 07/27/2019 0350   BILITOT 2.0 (H) 07/27/2019 0350   GFRNONAA >60 08/30/2019 0955   GFRAA >60 08/30/2019 0955   No results found for: CHOL, HDL, LDLCALC, LDLDIRECT, TRIG, CHOLHDL No results found for: HGBA1C Lab Results  Component Value Date   VITAMINB12 545 01/17/2019   No results found for: TSH    ASSESSMENT AND PLAN 84 y.o. year old female  has a past medical history of Alzheimer disease ([place]) (01/17/2019), Anxiety, Arthritis, Frequent UTI, Hypertension, Inguinal hernia, Shingles, Urinary frequency, Urinary urgency, and Wears glasses. here with:  1.  Memory disturbance, Alzheimer's disease  She will remain on Aricept 5 mg (side effect of dizziness with higher dose), continue Namenda 10 mg twice a day (from PCP).  She remains stable, continues to have difficulty with word finding, but has remained quite active, cooks 3 meals a day.  I offered consultation with a speech therapist, but she is going to think about this.  She will continue follow-up with her primary doctor, follow-up here in 1 year or sooner if needed.  I spent 20 minutes of face-to-face and non-face-to-face time with patient.  This included previsit chart review, lab review, study review, order entry, electronic health record documentation, patient education.  Butler Denmark, AGNP-C, DNP 02/25/2020, 11:38 AM [facility] [address], [place], [place] [address] [phone]

## 2020-02-25 NOTE — Progress Notes (Signed)
I have read the note, and I agree with the clinical assessment and plan.  Emmett Bracknell K Shakur Lembo   

## 2020-03-26 ENCOUNTER — Ambulatory Visit
Admission: RE | Admit: 2020-03-26 | Discharge: 2020-03-26 | Disposition: A | Discharge status: Home / Self Care | Payer: Medicare HMO | Source: Ambulatory Visit | Attending: Internal Medicine | Admitting: Internal Medicine

## 2020-03-26 ENCOUNTER — Other Ambulatory Visit: Payer: Self-pay | Admitting: Internal Medicine

## 2020-03-26 DIAGNOSIS — M545 Low back pain, unspecified: Secondary | ICD-10-CM

## 2020-03-26 DIAGNOSIS — M25522 Pain in left elbow: Secondary | ICD-10-CM | POA: Diagnosis not present

## 2020-03-26 DIAGNOSIS — M7989 Other specified soft tissue disorders: Secondary | ICD-10-CM | POA: Diagnosis not present

## 2020-03-26 DIAGNOSIS — W19XXXA Unspecified fall, initial encounter: Secondary | ICD-10-CM | POA: Diagnosis not present

## 2020-03-26 DIAGNOSIS — S79912A Unspecified injury of left hip, initial encounter: Secondary | ICD-10-CM | POA: Diagnosis not present

## 2020-03-26 DIAGNOSIS — S79911A Unspecified injury of right hip, initial encounter: Secondary | ICD-10-CM | POA: Diagnosis not present

## 2020-03-26 DIAGNOSIS — S59902A Unspecified injury of left elbow, initial encounter: Secondary | ICD-10-CM | POA: Diagnosis not present

## 2020-05-06 DIAGNOSIS — I10 Essential (primary) hypertension: Secondary | ICD-10-CM | POA: Diagnosis not present

## 2020-05-06 DIAGNOSIS — E785 Hyperlipidemia, unspecified: Secondary | ICD-10-CM | POA: Diagnosis not present

## 2020-05-06 DIAGNOSIS — S5002XA Contusion of left elbow, initial encounter: Secondary | ICD-10-CM | POA: Diagnosis not present

## 2020-05-06 DIAGNOSIS — W19XXXS Unspecified fall, sequela: Secondary | ICD-10-CM | POA: Diagnosis not present

## 2020-05-06 DIAGNOSIS — R69 Illness, unspecified: Secondary | ICD-10-CM | POA: Diagnosis not present

## 2020-05-13 DIAGNOSIS — E785 Hyperlipidemia, unspecified: Secondary | ICD-10-CM | POA: Diagnosis not present

## 2020-05-13 DIAGNOSIS — Z833 Family history of diabetes mellitus: Secondary | ICD-10-CM | POA: Diagnosis not present

## 2020-05-13 DIAGNOSIS — Z8249 Family history of ischemic heart disease and other diseases of the circulatory system: Secondary | ICD-10-CM | POA: Diagnosis not present

## 2020-05-13 DIAGNOSIS — I951 Orthostatic hypotension: Secondary | ICD-10-CM | POA: Diagnosis not present

## 2020-05-13 DIAGNOSIS — R69 Illness, unspecified: Secondary | ICD-10-CM | POA: Diagnosis not present

## 2020-05-13 DIAGNOSIS — Z803 Family history of malignant neoplasm of breast: Secondary | ICD-10-CM | POA: Diagnosis not present

## 2020-05-13 DIAGNOSIS — I10 Essential (primary) hypertension: Secondary | ICD-10-CM | POA: Diagnosis not present

## 2020-05-13 DIAGNOSIS — Z008 Encounter for other general examination: Secondary | ICD-10-CM | POA: Diagnosis not present

## 2020-05-13 DIAGNOSIS — Z87891 Personal history of nicotine dependence: Secondary | ICD-10-CM | POA: Diagnosis not present

## 2020-06-10 DIAGNOSIS — I10 Essential (primary) hypertension: Secondary | ICD-10-CM | POA: Diagnosis not present

## 2020-06-10 DIAGNOSIS — E7800 Pure hypercholesterolemia, unspecified: Secondary | ICD-10-CM | POA: Diagnosis not present

## 2020-06-10 DIAGNOSIS — G301 Alzheimer's disease with late onset: Secondary | ICD-10-CM | POA: Diagnosis not present

## 2020-06-10 DIAGNOSIS — E785 Hyperlipidemia, unspecified: Secondary | ICD-10-CM | POA: Diagnosis not present

## 2020-06-10 DIAGNOSIS — E782 Mixed hyperlipidemia: Secondary | ICD-10-CM | POA: Diagnosis not present

## 2020-06-10 DIAGNOSIS — R69 Illness, unspecified: Secondary | ICD-10-CM | POA: Diagnosis not present

## 2020-06-12 ENCOUNTER — Ambulatory Visit (INDEPENDENT_AMBULATORY_CARE_PROVIDER_SITE_OTHER): Payer: Medicare HMO

## 2020-06-12 ENCOUNTER — Other Ambulatory Visit: Payer: Self-pay

## 2020-06-12 ENCOUNTER — Encounter: Payer: Self-pay | Admitting: Podiatry

## 2020-06-12 ENCOUNTER — Ambulatory Visit: Payer: Medicare HMO | Admitting: Podiatry

## 2020-06-12 DIAGNOSIS — L84 Corns and callosities: Secondary | ICD-10-CM | POA: Diagnosis not present

## 2020-06-12 DIAGNOSIS — M79675 Pain in left toe(s): Secondary | ICD-10-CM

## 2020-06-12 DIAGNOSIS — B351 Tinea unguium: Secondary | ICD-10-CM | POA: Diagnosis not present

## 2020-06-12 DIAGNOSIS — M779 Enthesopathy, unspecified: Secondary | ICD-10-CM | POA: Diagnosis not present

## 2020-06-12 DIAGNOSIS — M2041 Other hammer toe(s) (acquired), right foot: Secondary | ICD-10-CM | POA: Diagnosis not present

## 2020-06-12 DIAGNOSIS — M79674 Pain in right toe(s): Secondary | ICD-10-CM | POA: Diagnosis not present

## 2020-06-12 DIAGNOSIS — M21621 Bunionette of right foot: Secondary | ICD-10-CM

## 2020-06-15 NOTE — Progress Notes (Signed)
Subjective:   Patient ID: Alicia [name], female   DOB: 84 y.o.   MRN: [id number]   HPI Patient presents with a chronic lesion with fluid buildup around the fifth metatarsal head right that is become painful over the last 6 months.  States she is tried to trim and pad without relief and also presents with nail disease that are severely thickened and impossible for her to cut and they become painful with shoe gear.  Patient does not smoke likes to be active   Review of Systems  All other systems reviewed and are negative.       Objective:  Physical Exam Vitals and nursing note reviewed.  Constitutional:      Appearance: She is well-developed.  Pulmonary:     Effort: Pulmonary effort is normal.  Musculoskeletal:        General: Normal range of motion.  Skin:    General: Skin is warm.  Neurological:     Mental Status: She is alert.     Neurovascular status was found to be intact with patient noted to have moderate diminishment range of motion subtalar midtarsal joint and is noted to have muscle strength is mildly diminished with some all Alzheimer's symptoms present.  Patient has inflammation around the fifth MPJ right with fluid buildup around the area of lesion formation that is painful and nail disease 1-5 both feet that are thick dystrophic and impossible to cut     Assessment:  Inflammatory capsulitis fifth MPJ right with tailor's bunion deformity lesion formation and nail disease 1-5 both feet with pain     Plan:  H&P reviewed all conditions.  Did sterile prep and injected around the fifth MPJ 2 mg Dexasone Kenalog 5 mg Xylocaine debrided lesion and then debrided nailbeds 1-5 both feet with no iatrogenic bleeding.  Discussed the possibility for fifth metatarsal head resection if symptoms were to persist  X-rays indicate there is enlargement of the head of the fifth metatarsal right with no indications of other pathology associated with it

## 2020-07-29 DIAGNOSIS — R69 Illness, unspecified: Secondary | ICD-10-CM | POA: Diagnosis not present

## 2020-07-29 DIAGNOSIS — E7800 Pure hypercholesterolemia, unspecified: Secondary | ICD-10-CM | POA: Diagnosis not present

## 2020-07-29 DIAGNOSIS — E785 Hyperlipidemia, unspecified: Secondary | ICD-10-CM | POA: Diagnosis not present

## 2020-07-29 DIAGNOSIS — I10 Essential (primary) hypertension: Secondary | ICD-10-CM | POA: Diagnosis not present

## 2020-07-29 DIAGNOSIS — E782 Mixed hyperlipidemia: Secondary | ICD-10-CM | POA: Diagnosis not present

## 2020-07-29 DIAGNOSIS — G301 Alzheimer's disease with late onset: Secondary | ICD-10-CM | POA: Diagnosis not present

## 2020-09-16 ENCOUNTER — Ambulatory Visit: Payer: Medicare HMO | Attending: Internal Medicine

## 2020-09-16 ENCOUNTER — Other Ambulatory Visit (HOSPITAL_COMMUNITY): Payer: Self-pay | Admitting: Internal Medicine

## 2020-09-16 DIAGNOSIS — Z23 Encounter for immunization: Secondary | ICD-10-CM

## 2020-09-16 NOTE — Progress Notes (Signed)
   Covid-19 Vaccination Clinic  Name:  LURLENE RONDA    MRN: [id number] DOB: 1935-12-18  09/16/2020  Ms. Prater was observed post Covid-19 immunization for 15 minutes without incident. She was provided with Vaccine Information Sheet and instruction to access the V-Safe system.   Ms. Moise was instructed to call 911 with any severe reactions post vaccine: Marland Kitchen Difficulty breathing  . Swelling of face and throat  . A fast heartbeat  . A bad rash all over body  . Dizziness and weakness

## 2020-10-08 DIAGNOSIS — R69 Illness, unspecified: Secondary | ICD-10-CM | POA: Diagnosis not present

## 2020-10-08 DIAGNOSIS — E782 Mixed hyperlipidemia: Secondary | ICD-10-CM | POA: Diagnosis not present

## 2020-10-08 DIAGNOSIS — E785 Hyperlipidemia, unspecified: Secondary | ICD-10-CM | POA: Diagnosis not present

## 2020-10-08 DIAGNOSIS — E7800 Pure hypercholesterolemia, unspecified: Secondary | ICD-10-CM | POA: Diagnosis not present

## 2020-10-08 DIAGNOSIS — G301 Alzheimer's disease with late onset: Secondary | ICD-10-CM | POA: Diagnosis not present

## 2020-10-08 DIAGNOSIS — I10 Essential (primary) hypertension: Secondary | ICD-10-CM | POA: Diagnosis not present

## 2020-11-04 DIAGNOSIS — Z1389 Encounter for screening for other disorder: Secondary | ICD-10-CM | POA: Diagnosis not present

## 2020-11-04 DIAGNOSIS — I10 Essential (primary) hypertension: Secondary | ICD-10-CM | POA: Diagnosis not present

## 2020-11-04 DIAGNOSIS — Z23 Encounter for immunization: Secondary | ICD-10-CM | POA: Diagnosis not present

## 2020-11-04 DIAGNOSIS — E785 Hyperlipidemia, unspecified: Secondary | ICD-10-CM | POA: Diagnosis not present

## 2020-11-04 DIAGNOSIS — Z7189 Other specified counseling: Secondary | ICD-10-CM | POA: Diagnosis not present

## 2020-11-04 DIAGNOSIS — Z0000 Encounter for general adult medical examination without abnormal findings: Secondary | ICD-10-CM | POA: Diagnosis not present

## 2020-11-04 DIAGNOSIS — E2839 Other primary ovarian failure: Secondary | ICD-10-CM | POA: Diagnosis not present

## 2020-11-04 DIAGNOSIS — R69 Illness, unspecified: Secondary | ICD-10-CM | POA: Diagnosis not present

## 2020-11-25 DIAGNOSIS — G301 Alzheimer's disease with late onset: Secondary | ICD-10-CM | POA: Diagnosis not present

## 2020-11-25 DIAGNOSIS — R69 Illness, unspecified: Secondary | ICD-10-CM | POA: Diagnosis not present

## 2020-11-25 DIAGNOSIS — I10 Essential (primary) hypertension: Secondary | ICD-10-CM | POA: Diagnosis not present

## 2020-11-25 DIAGNOSIS — E7800 Pure hypercholesterolemia, unspecified: Secondary | ICD-10-CM | POA: Diagnosis not present

## 2020-11-25 DIAGNOSIS — E785 Hyperlipidemia, unspecified: Secondary | ICD-10-CM | POA: Diagnosis not present

## 2020-11-25 DIAGNOSIS — E782 Mixed hyperlipidemia: Secondary | ICD-10-CM | POA: Diagnosis not present

## 2021-01-14 DIAGNOSIS — E785 Hyperlipidemia, unspecified: Secondary | ICD-10-CM | POA: Diagnosis not present

## 2021-01-14 DIAGNOSIS — I10 Essential (primary) hypertension: Secondary | ICD-10-CM | POA: Diagnosis not present

## 2021-01-14 DIAGNOSIS — G301 Alzheimer's disease with late onset: Secondary | ICD-10-CM | POA: Diagnosis not present

## 2021-01-14 DIAGNOSIS — E782 Mixed hyperlipidemia: Secondary | ICD-10-CM | POA: Diagnosis not present

## 2021-02-04 IMAGING — CR CHEST - 2 VIEW
2 series · 2 of 2 positions shown · non-contrast
Comparison: 10/10/2013

CLINICAL DATA: Altered mental status

EXAM:
CHEST - 2 VIEW

[chest lat]
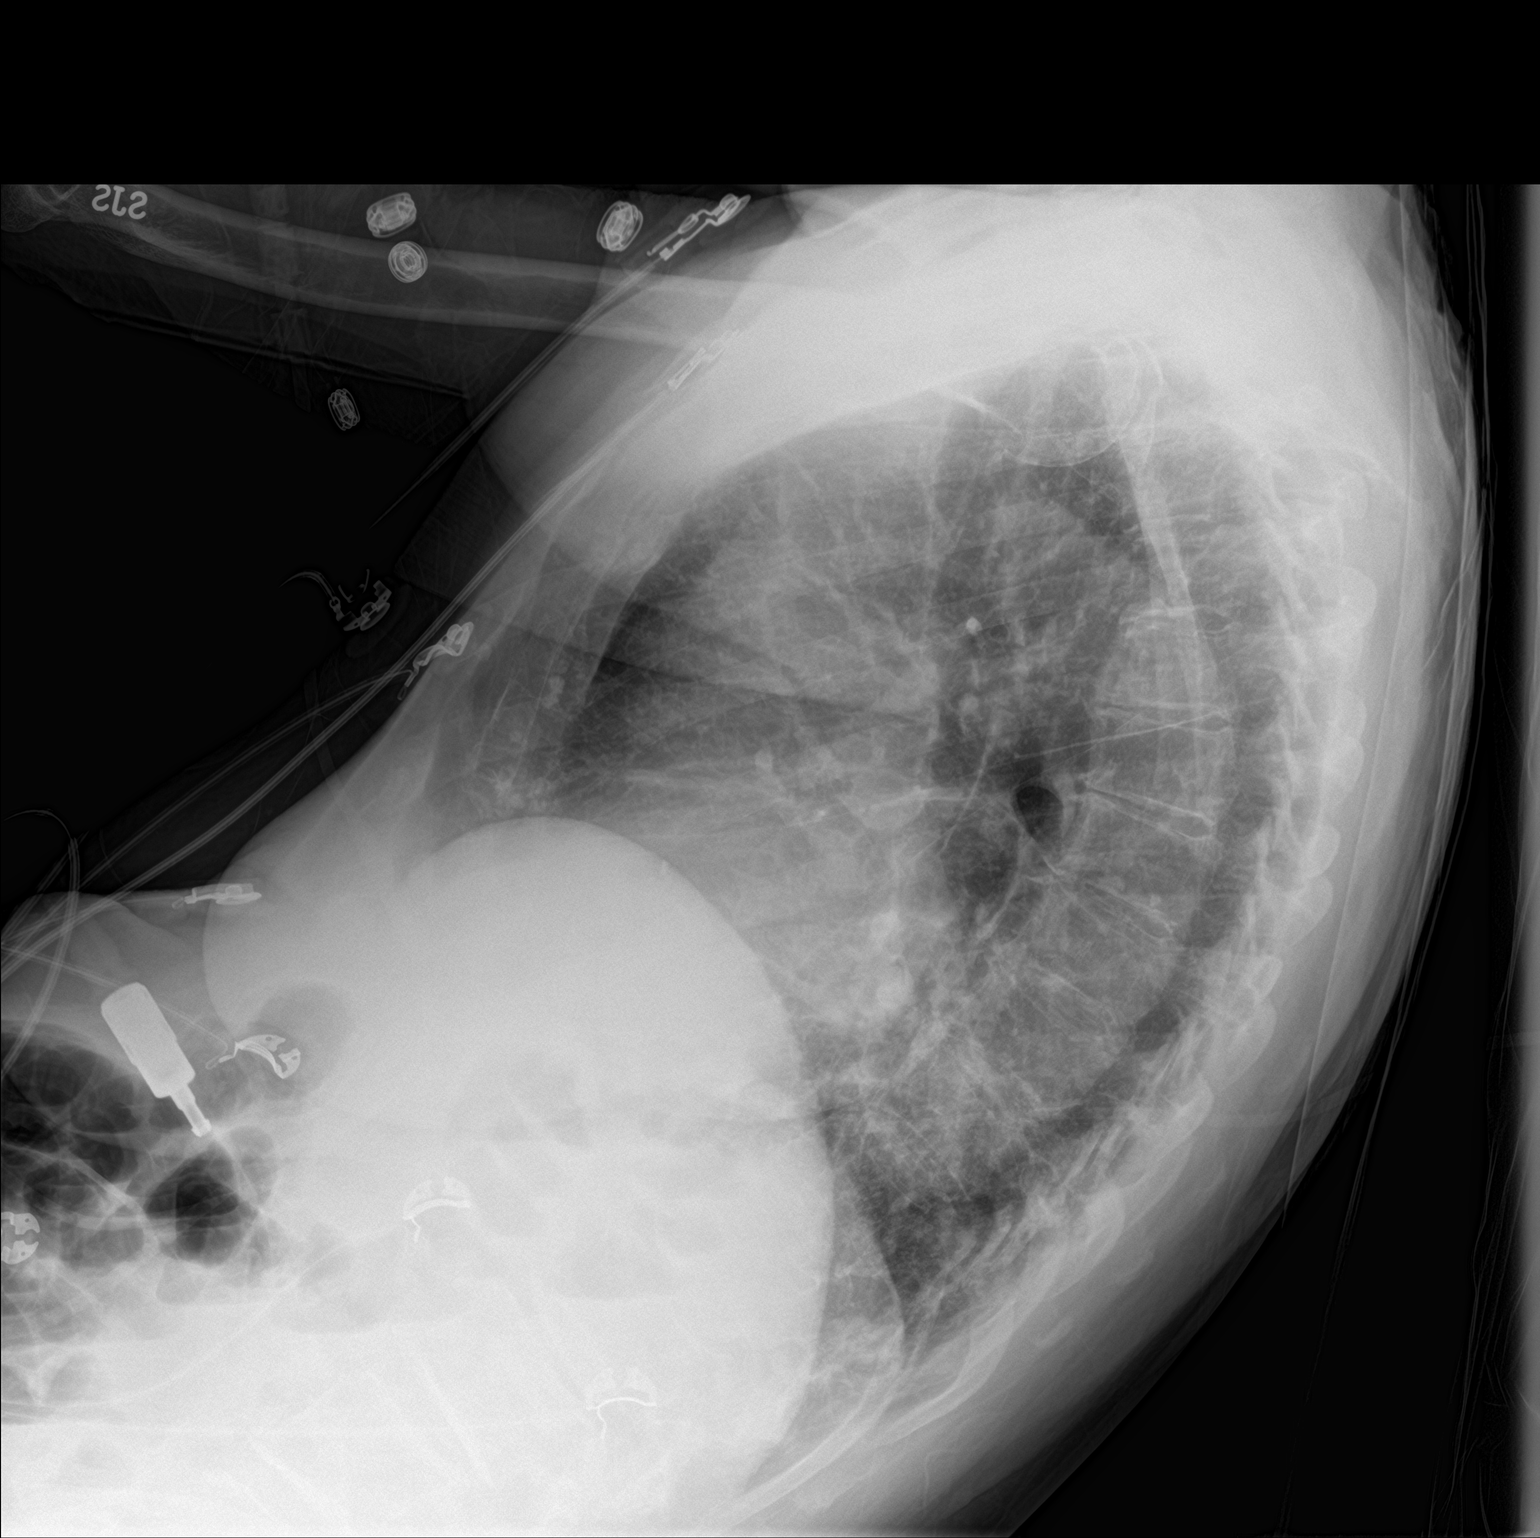

[chest ap]
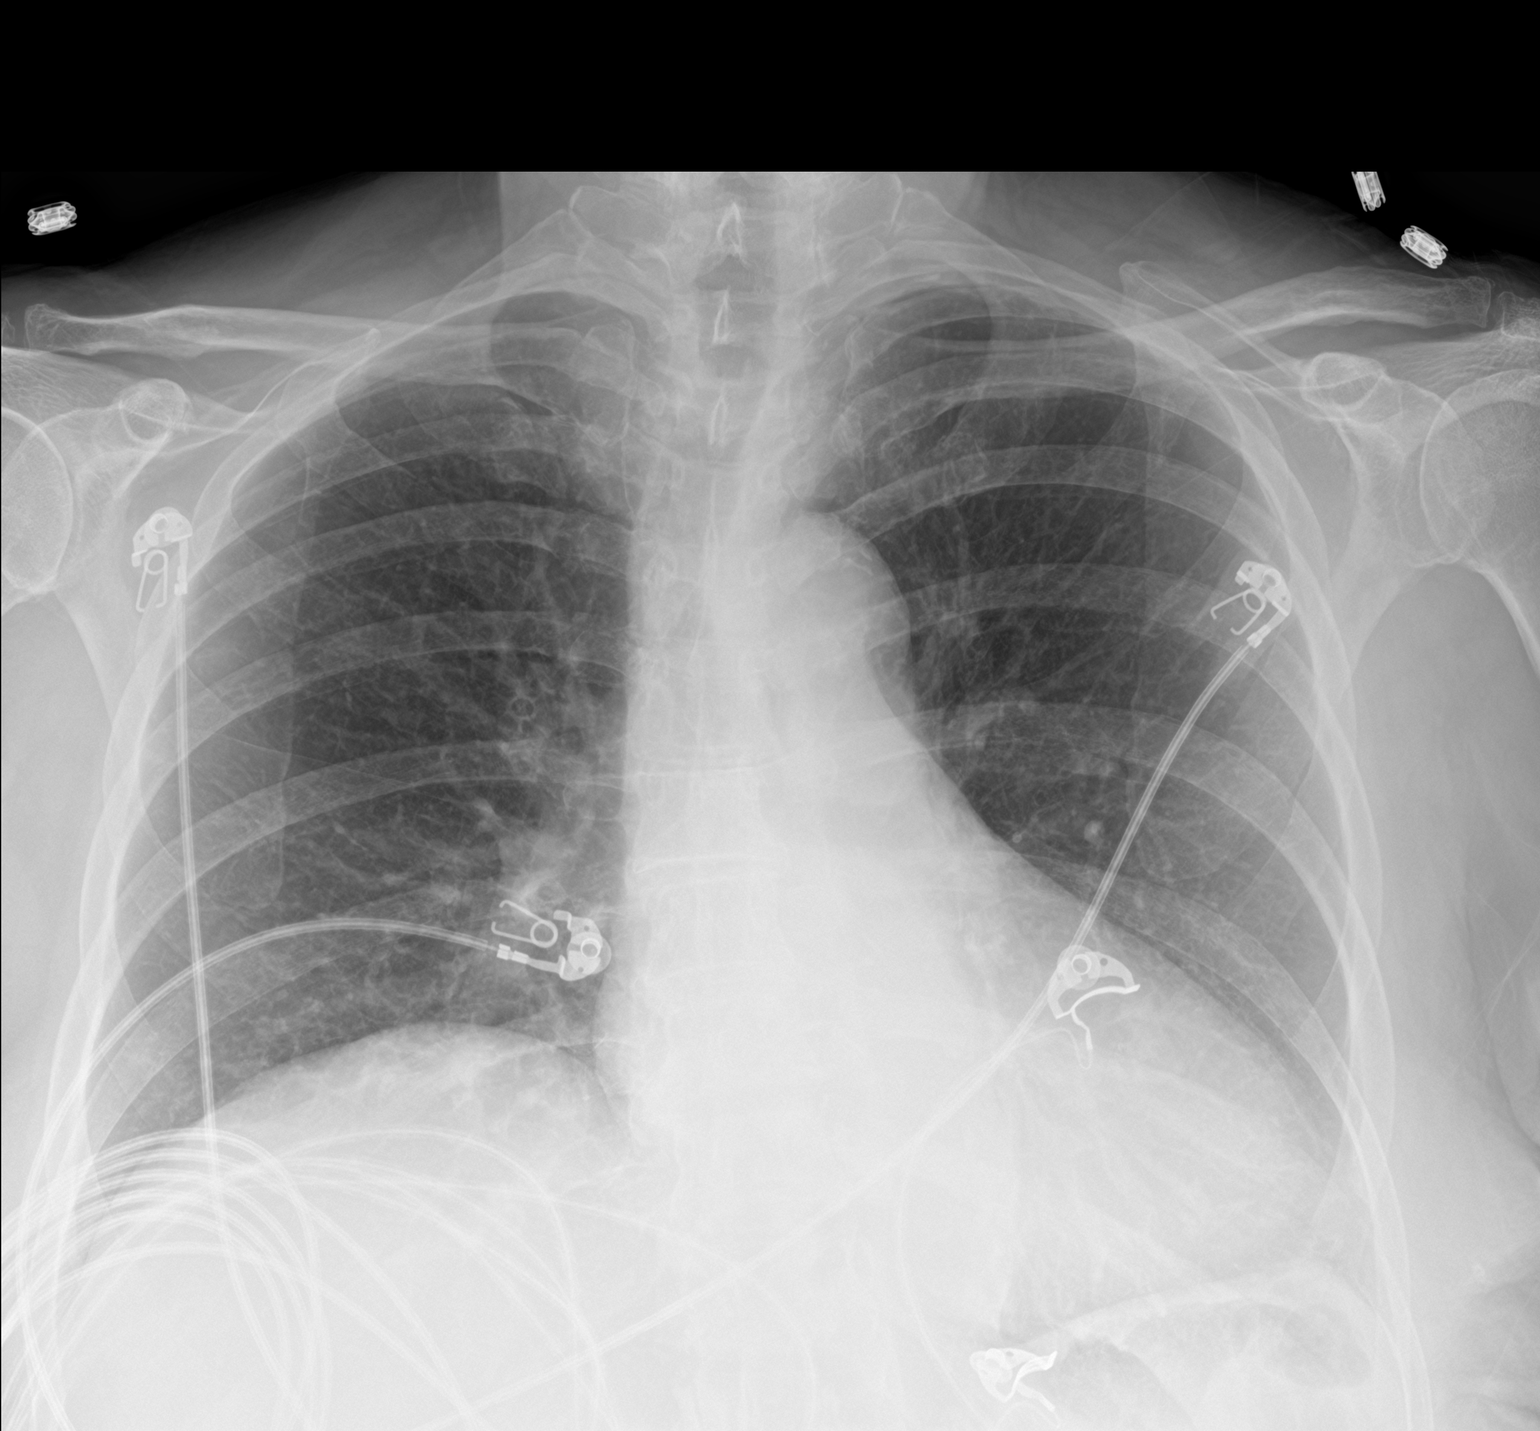

[2 of 2 positions shown; findings below may reference images not displayed]

FINDINGS: The heart size and mediastinal contours are within normal limits.
Both lungs are clear. The visualized skeletal structures are
unremarkable.
IMPRESSION: No active cardiopulmonary disease.

## 2021-02-25 ENCOUNTER — Ambulatory Visit: Payer: Medicare Other | Admitting: Neurology

## 2021-02-25 ENCOUNTER — Encounter: Payer: Self-pay | Admitting: Neurology

## 2021-02-25 VITALS — BP 134/84 | HR 74 | Ht 66.0 in | Wt 140.0 lb

## 2021-02-25 DIAGNOSIS — G309 Alzheimer's disease, unspecified: Secondary | ICD-10-CM

## 2021-02-25 DIAGNOSIS — F0280 Dementia in other diseases classified elsewhere without behavioral disturbance: Secondary | ICD-10-CM | POA: Diagnosis not present

## 2021-02-25 MED ORDER — DONEPEZIL HCL 5 MG PO TABS: 5.0000 mg | ORAL_TABLET | Freq: Every day | ORAL | 3 refills | Status: DC

## 2021-02-25 NOTE — Progress Notes (Signed)
PATIENT: Alicia [name] DOB: [birth year]  REASON FOR VISIT: follow up HISTORY FROM: patient  HISTORY OF PRESENT ILLNESS: Today 02/25/21 Alicia [name] is an 85 year old female with history of memory disturbance and word finding difficulty.  She is on low-dose Aricept and Namenda.  Higher dose Aricept resulted in dizziness.  Since last seen, remains overall stable, main issue is word finding difficulty, trouble expressing herself.  She remains active, cooks 3 meals a day, does her own ADLs and manages the household.  Her husband does help with medications.  She is no longer driving, her husband does this.  She and her husband still live in their home, they have been married for 33 years.  She sleeps well, denies any falls.  They have a handicapped son who lives in town, another son in [place].  They are been encouraged to move to a senior living community, not sure they are ready for this.  Here today for evaluation accompanied by her husband.  Update 02/25/2020 SS: Alicia [name] is an 85 year old female with history of memory disturbance and word finding difficulty.  She remains on Aricept and Namenda.  She is on Aricept 5 mg, due to report of dizziness. Since last seen, memory has remained the same, she has continued difficulties with word finding and expressing herself.  She remains quite active, cooks 3 meals a day, does all of her own ADLs, and housework. She does her own medications.  She continues to drive only short distances, her husband usually does the driving.  She denies any falls, reports sleeps very well.  She says she is overall very happy. MMSE attempted, she started crying. She presents today accompanied by her husband.  HISTORY 08/28/2019 SS: Alicia [name] is an 85 year old female with history of memory disturbance and word finding difficulty.  She is here with her husband today.  She reports she continues to have difficulty with word finding.  She is still quite active.  She is able to  perform all her own ADLs, and do cooking in the home.  She is even able to continue driving, but only short distances to places that she is familiar with.  She does manage her medications.  She denies any trouble with her walking or balance.  She has not had any falls.  She says with Covid, she has not been able to go out with her friends.  She does still talk on the phone with friends, and reports she may have difficulty finding the right words.  She remains on Namenda and Aricept.  She did have to decrease her dose of Aricept to 5 mg daily due to report of dizziness.  She is planning to have surgery in the next few days to have hernia repair.  She indicates she sleeps well at night.  She presents today for follow-up accompanied by her husband.   REVIEW OF SYSTEMS: Out of a complete 14 system review of symptoms, the patient complains only of the following symptoms, and all other reviewed systems are negative.  Memory loss  ALLERGIES: Allergies  Allergen Reactions  . Contrast Media [Iodinated Diagnostic Agents]     unknown    HOME MEDICATIONS: Outpatient Medications Prior to Visit  Medication Sig Dispense Refill  . Cholecalciferol (VITAMIN D3) 50 MCG (2000 UT) TABS Take 2,000 Units by mouth daily.    Marland Kitchen donepezil (ARICEPT) 5 MG tablet Take 1 tablet (5 mg total) by mouth at bedtime. 90 tablet 3  . ibuprofen (ADVIL) 200  MG tablet Take 400 mg by mouth every 8 (eight) hours as needed (for pain.).    Marland Kitchen lisinopril (PRINIVIL,ZESTRIL) 20 MG tablet Take 20 mg by mouth daily.    . memantine (NAMENDA) 10 MG tablet Take 10 mg by mouth 2 (two) times daily.    . Polyvinyl Alcohol-Povidone (REFRESH OP) Place 1 drop into both eyes daily.    . rosuvastatin (CRESTOR) 5 MG tablet Take 5 mg by mouth at bedtime.     No facility-administered medications prior to visit.    PAST MEDICAL HISTORY: Past Medical History:  Diagnosis Date  . Alzheimer disease ([place]) 01/17/2019  . Anxiety   . Arthritis   . Frequent  UTI    over the last year  . Hypertension   . Inguinal hernia    right  . Shingles   . Urinary frequency    over the year  . Urinary urgency   . Wears glasses     PAST SURGICAL HISTORY: Past Surgical History:  Procedure Laterality Date  . BUNIONECTOMY     both feet  . EYE SURGERY Left 04-2013   cataract Left eye  . INGUINAL HERNIA REPAIR Right 09/16/2019   Procedure: LAPAROSCOPIC RIGHT INGUINAL HERNIA REPAIR WITH MESH;  Surgeon: Ralene Ok, MD;  Location: [facility];  Service: General;  Laterality: Right;  . LUMBAR LAMINECTOMY N/A 10/14/2013   Procedure: MICRODISCECTOMY LUMBAR LAMINECTOMY/L4-5 Decompression, Excision Extradural Intraspinal Cyst;  Surgeon: Marybelle Killings, MD;  Location: [facility];  Service: Orthopedics;  Laterality: N/A;  L4-5 Decompression, Excision Extradural Intraspinal Cyst    FAMILY HISTORY: Family History  Problem Relation Age of Onset  . High blood pressure Mother   . Diabetes Sister   . High blood pressure Sister   . Breast cancer Sister   . Colon cancer Neg Hx   . Esophageal cancer Neg Hx     SOCIAL HISTORY: Social History   Socioeconomic History  . Marital status: Married    Spouse name: Not on file  . Number of children: Not on file  . Years of education: 2  . Highest education level: Some college, no degree  Occupational History  . Occupation: retired  Tobacco Use  . Smoking status: Former Smoker    Types: Cigarettes  . Smokeless tobacco: Never Used  Vaping Use  . Vaping Use: Never used  Substance and Sexual Activity  . Alcohol use: Yes    Comment: 1 glass of wine daily  . Drug use: No  . Sexual activity: Not Currently  Other Topics Concern  . Not on file  Social History Narrative   Right handed   Lives at home with husband    Moderate caffeine usage    Social Determinants of Health   Financial Resource Strain: Not on file  Food Insecurity: Not on file  Transportation Needs: Not on file  Physical Activity: Not on file   Stress: Not on file  Social Connections: Not on file  Intimate Partner Violence: Not on file   PHYSICAL EXAM  There were no vitals filed for this visit. There is no height or weight on file to calculate BMI.  Generalized: Well developed, in no acute distress  MMSE - Mini Mental State Exam 02/25/2020 08/28/2019 08/28/2019  Not completed: - (No Data) Unable to complete  Orientation to time 3 1 -  Orientation to Place 2 1 -  Registration 2 3 -  Attention/ Calculation 0 0 -  Recall 1 0 -  Language- name 2  objects 2 2 -  Language- repeat 0 0 -  Language- follow 3 step command 3 3 -  Language- read & follow direction 0 1 -  Write a sentence 0 1 -  Copy design 0 1 -  Total score 13 13 -    Neurological examination  Mentation: Alert oriented to time, place, history taking, significant word finding difficulty, well-appearing, very pleasant, engaging, mild difficulty with exam, mild word finding difficulty Cranial nerve II-XII: Pupils were equal round reactive to light. Extraocular movements were full, visual field were full on confrontational test. Facial sensation and strength were normal.  Head turning and shoulder shrug  were normal and symmetric. Motor: The motor testing reveals 5 over 5 strength of all 4 extremities. Good symmetric motor tone is noted throughout.  Sensory: Sensory testing is intact to soft touch on all 4 extremities. No evidence of extinction is noted.  Coordination: Cerebellar testing reveals good finger-nose-finger and heel-to-shin bilaterally, slight apraxia performing these tasks Gait and station: Gait is normal.  Reflexes: Deep tendon reflexes are symmetric and normal bilaterally.   DIAGNOSTIC DATA (LABS, IMAGING, TESTING) - I reviewed patient records, labs, notes, testing and imaging myself where available.  Lab Results  Component Value Date   WBC 5.5 08/30/2019   HGB 15.3 (H) 09/16/2019   HCT 45.0 09/16/2019   MCV 99.6 08/30/2019   PLT 200 08/30/2019       Component Value Date/Time   NA 136 09/16/2019 1115   K 4.2 09/16/2019 1115   CL 99 09/16/2019 1115   CO2 21 (L) 08/30/2019 0955   GLUCOSE 95 09/16/2019 1115   BUN 7 (L) 09/16/2019 1115   CREATININE 0.60 09/16/2019 1115   CALCIUM 9.2 08/30/2019 0955   PROT 6.8 07/27/2019 0350   ALBUMIN 4.0 07/27/2019 0350   AST 19 07/27/2019 0350   ALT 11 07/27/2019 0350   ALKPHOS 44 07/27/2019 0350   BILITOT 2.0 (H) 07/27/2019 0350   GFRNONAA >60 08/30/2019 0955   GFRAA >60 08/30/2019 0955   No results found for: CHOL, HDL, LDLCALC, LDLDIRECT, TRIG, CHOLHDL No results found for: HGBA1C Lab Results  Component Value Date   VITAMINB12 545 01/17/2019   No results found for: TSH    ASSESSMENT AND PLAN 85 y.o. year old female  has a past medical history of Alzheimer disease ([place]) (01/17/2019), Anxiety, Arthritis, Frequent UTI, Hypertension, Inguinal hernia, Shingles, Urinary frequency, Urinary urgency, and Wears glasses. here with:  1.  Memory disturbance, Alzheimer's disease  -Remains overall stable, will continue low-dose Aricept (full dose resulted in dizziness) -Continue Namenda from PCP -She continues to remain quite active and independent, her main difficulty is word finding trouble -MMSE was not completed, she became discouraged about word finding, she functions much higher than her score would suggest -Continue follow-up with PCP, follow-up here in 1 year or sooner if needed  I spent 30 minutes of [name]-to-[name] and non-[name]-to-[name] time with patient.  This included previsit chart review, lab review, study review, order entry, electronic health record documentation, patient education.  Butler Denmark, AGNP-C, DNP 02/25/2021, 11:20 AM [facility] [address] [place], [place] [address] [phone]

## 2021-02-25 NOTE — Progress Notes (Signed)
I have read the note, and I agree with the clinical assessment and plan.  Jamine Highfill K Labresha Mellor   

## 2021-02-25 NOTE — Patient Instructions (Signed)
Continue current medications Encouraged to remain active Continue to see your primary doctor See you back in 1 year

## 2021-03-10 ENCOUNTER — Other Ambulatory Visit: Payer: Self-pay | Admitting: Internal Medicine

## 2021-03-10 DIAGNOSIS — E2839 Other primary ovarian failure: Secondary | ICD-10-CM

## 2021-03-18 DIAGNOSIS — I10 Essential (primary) hypertension: Secondary | ICD-10-CM | POA: Diagnosis not present

## 2021-03-18 DIAGNOSIS — E785 Hyperlipidemia, unspecified: Secondary | ICD-10-CM | POA: Diagnosis not present

## 2021-03-18 DIAGNOSIS — E782 Mixed hyperlipidemia: Secondary | ICD-10-CM | POA: Diagnosis not present

## 2021-03-18 DIAGNOSIS — G301 Alzheimer's disease with late onset: Secondary | ICD-10-CM | POA: Diagnosis not present

## 2021-03-18 DIAGNOSIS — E7800 Pure hypercholesterolemia, unspecified: Secondary | ICD-10-CM | POA: Diagnosis not present

## 2021-03-19 DIAGNOSIS — L209 Atopic dermatitis, unspecified: Secondary | ICD-10-CM | POA: Diagnosis not present

## 2021-04-16 DIAGNOSIS — E782 Mixed hyperlipidemia: Secondary | ICD-10-CM | POA: Diagnosis not present

## 2021-04-16 DIAGNOSIS — E7800 Pure hypercholesterolemia, unspecified: Secondary | ICD-10-CM | POA: Diagnosis not present

## 2021-04-16 DIAGNOSIS — E785 Hyperlipidemia, unspecified: Secondary | ICD-10-CM | POA: Diagnosis not present

## 2021-04-16 DIAGNOSIS — G301 Alzheimer's disease with late onset: Secondary | ICD-10-CM | POA: Diagnosis not present

## 2021-04-16 DIAGNOSIS — I10 Essential (primary) hypertension: Secondary | ICD-10-CM | POA: Diagnosis not present

## 2021-05-05 DIAGNOSIS — E785 Hyperlipidemia, unspecified: Secondary | ICD-10-CM | POA: Diagnosis not present

## 2021-05-05 DIAGNOSIS — I10 Essential (primary) hypertension: Secondary | ICD-10-CM | POA: Diagnosis not present

## 2021-05-17 DIAGNOSIS — R3915 Urgency of urination: Secondary | ICD-10-CM | POA: Diagnosis not present

## 2021-05-19 ENCOUNTER — Ambulatory Visit: Payer: Medicare Other | Attending: Internal Medicine

## 2021-05-19 DIAGNOSIS — Z23 Encounter for immunization: Secondary | ICD-10-CM

## 2021-05-19 NOTE — Progress Notes (Signed)
   Covid-19 Vaccination Clinic  Name:  Alicia [name]    MRN: [id number] DOB: [birth year]  05/19/2021  [name] was observed post Covid-19 immunization for 15 minutes without incident. She was provided with Vaccine Information Sheet and instruction to access the V-Safe system.   [name] was instructed to call 911 with any severe reactions post vaccine: Difficulty breathing  Swelling of face and throat  A fast heartbeat  A bad rash all over body  Dizziness and weakness   Immunizations Administered     Name Date Dose VIS Date Route   PFIZER Comrnaty(Gray TOP) Covid-19 Vaccine 05/19/2021 11:35 AM 0.3 mL 11/05/2020 Intramuscular   Manufacturer: Industry   Lot: [id number]   [place]: [place], PharmD, MBA Clinical Acute Care Pharmacist

## 2021-05-20 ENCOUNTER — Other Ambulatory Visit (HOSPITAL_COMMUNITY): Payer: Self-pay

## 2021-05-20 MED ORDER — COVID-19 MRNA VAC-TRIS(PFIZER) 30 MCG/0.3ML IM SUSP
INTRAMUSCULAR | 0 refills | Status: DC
  Filled 2021-05-20: qty 0.3, 1d supply, fill #0

## 2021-05-21 ENCOUNTER — Other Ambulatory Visit (HOSPITAL_COMMUNITY): Payer: Self-pay

## 2021-06-15 DIAGNOSIS — G301 Alzheimer's disease with late onset: Secondary | ICD-10-CM | POA: Diagnosis not present

## 2021-06-15 DIAGNOSIS — I10 Essential (primary) hypertension: Secondary | ICD-10-CM | POA: Diagnosis not present

## 2021-06-15 DIAGNOSIS — E782 Mixed hyperlipidemia: Secondary | ICD-10-CM | POA: Diagnosis not present

## 2021-06-15 DIAGNOSIS — E785 Hyperlipidemia, unspecified: Secondary | ICD-10-CM | POA: Diagnosis not present

## 2021-06-15 DIAGNOSIS — E7800 Pure hypercholesterolemia, unspecified: Secondary | ICD-10-CM | POA: Diagnosis not present

## 2021-06-24 DIAGNOSIS — N390 Urinary tract infection, site not specified: Secondary | ICD-10-CM | POA: Diagnosis not present

## 2021-08-16 DIAGNOSIS — K5901 Slow transit constipation: Secondary | ICD-10-CM | POA: Diagnosis not present

## 2021-08-20 DIAGNOSIS — I10 Essential (primary) hypertension: Secondary | ICD-10-CM | POA: Diagnosis not present

## 2021-08-20 DIAGNOSIS — G301 Alzheimer's disease with late onset: Secondary | ICD-10-CM | POA: Diagnosis not present

## 2021-08-20 DIAGNOSIS — E785 Hyperlipidemia, unspecified: Secondary | ICD-10-CM | POA: Diagnosis not present

## 2021-08-20 DIAGNOSIS — E7800 Pure hypercholesterolemia, unspecified: Secondary | ICD-10-CM | POA: Diagnosis not present

## 2021-08-20 DIAGNOSIS — E782 Mixed hyperlipidemia: Secondary | ICD-10-CM | POA: Diagnosis not present

## 2021-08-30 DIAGNOSIS — Z23 Encounter for immunization: Secondary | ICD-10-CM | POA: Diagnosis not present

## 2021-08-30 DIAGNOSIS — K5901 Slow transit constipation: Secondary | ICD-10-CM | POA: Diagnosis not present

## 2021-08-30 DIAGNOSIS — I10 Essential (primary) hypertension: Secondary | ICD-10-CM | POA: Diagnosis not present

## 2021-10-29 ENCOUNTER — Other Ambulatory Visit: Payer: Self-pay

## 2021-10-29 ENCOUNTER — Ambulatory Visit: Payer: Medicare Other | Attending: Internal Medicine

## 2021-10-29 ENCOUNTER — Other Ambulatory Visit (HOSPITAL_BASED_OUTPATIENT_CLINIC_OR_DEPARTMENT_OTHER): Payer: Self-pay

## 2021-10-29 DIAGNOSIS — Z23 Encounter for immunization: Secondary | ICD-10-CM

## 2021-10-29 MED ORDER — PFIZER COVID-19 VAC BIVALENT 30 MCG/0.3ML IM SUSP
INTRAMUSCULAR | 0 refills | Status: DC
  Filled 2021-10-29: qty 0.3, 1d supply, fill #0

## 2021-10-29 NOTE — Progress Notes (Signed)
   Covid-19 Vaccination Clinic  Name:  Alicia [name]    MRN: [id number] DOB: Nov 01, 1936  10/29/2021  Alicia [name] was observed post Covid-19 immunization for 15 minutes without incident. She was provided with Vaccine Information Sheet and instruction to access the V-Safe system.   Alicia [name] was instructed to call 911 with any severe reactions post vaccine: Difficulty breathing  Swelling of face and throat  A fast heartbeat  A bad rash all over body  Dizziness and weakness   Immunizations Administered     Name Date Dose VIS Date Route   Pfizer Covid-19 Vaccine Bivalent Booster 10/29/2021  2:55 PM 0.3 mL 07/28/2021 Intramuscular   Manufacturer: Railroad   Lot: [id number]   [place]: [phone]

## 2021-11-06 ENCOUNTER — Encounter (HOSPITAL_COMMUNITY): Payer: Self-pay | Admitting: Emergency Medicine

## 2021-11-06 ENCOUNTER — Other Ambulatory Visit: Payer: Self-pay

## 2021-11-06 ENCOUNTER — Emergency Department (HOSPITAL_COMMUNITY)
Admission: EM | Admit: 2021-11-06 | Discharge: 2021-11-06 | Disposition: A | Discharge status: Home / Self Care | Payer: Medicare Other | Attending: Emergency Medicine | Admitting: Emergency Medicine

## 2021-11-06 ENCOUNTER — Emergency Department (HOSPITAL_COMMUNITY): Payer: Medicare Other

## 2021-11-06 DIAGNOSIS — Z20822 Contact with and (suspected) exposure to covid-19: Secondary | ICD-10-CM | POA: Insufficient documentation

## 2021-11-06 DIAGNOSIS — R404 Transient alteration of awareness: Secondary | ICD-10-CM | POA: Diagnosis not present

## 2021-11-06 DIAGNOSIS — I10 Essential (primary) hypertension: Secondary | ICD-10-CM | POA: Insufficient documentation

## 2021-11-06 DIAGNOSIS — Z87891 Personal history of nicotine dependence: Secondary | ICD-10-CM | POA: Insufficient documentation

## 2021-11-06 DIAGNOSIS — R0902 Hypoxemia: Secondary | ICD-10-CM | POA: Diagnosis not present

## 2021-11-06 DIAGNOSIS — R55 Syncope and collapse: Secondary | ICD-10-CM | POA: Insufficient documentation

## 2021-11-06 DIAGNOSIS — Z79899 Other long term (current) drug therapy: Secondary | ICD-10-CM | POA: Diagnosis not present

## 2021-11-06 DIAGNOSIS — S0990XA Unspecified injury of head, initial encounter: Secondary | ICD-10-CM | POA: Diagnosis not present

## 2021-11-06 DIAGNOSIS — Z743 Need for continuous supervision: Secondary | ICD-10-CM | POA: Diagnosis not present

## 2021-11-06 LAB — CBC
HCT: 40.3 % (ref 36.0–46.0)
Hemoglobin: 13.5 g/dL (ref 12.0–15.0)
MCH: 31.9 pg (ref 26.0–34.0)
MCHC: 33.5 g/dL (ref 30.0–36.0)
MCV: 95.3 fL (ref 80.0–100.0)
Platelets: 171 10*3/uL (ref 150–400)
RBC: 4.23 MIL/uL (ref 3.87–5.11)
RDW: 14.5 % (ref 11.5–15.5)
WBC: 5.4 10*3/uL (ref 4.0–10.5)
nRBC: 0 % (ref 0.0–0.2)

## 2021-11-06 LAB — URINALYSIS, ROUTINE W REFLEX MICROSCOPIC
Bilirubin Urine: NEGATIVE
Glucose, UA: NEGATIVE mg/dL
Hgb urine dipstick: NEGATIVE
Ketones, ur: 5 mg/dL — AB
Nitrite: NEGATIVE
Protein, ur: NEGATIVE mg/dL
Specific Gravity, Urine: 1.009 (ref 1.005–1.030)
pH: 7 (ref 5.0–8.0)

## 2021-11-06 LAB — TROPONIN I (HIGH SENSITIVITY)
Troponin I (High Sensitivity): 6 ng/L (ref <18)
Troponin I (High Sensitivity): 6 ng/L (ref <18)

## 2021-11-06 LAB — BASIC METABOLIC PANEL
Anion gap: 7 (ref 5–15)
BUN: 12 mg/dL (ref 8–23)
CO2: 25 mmol/L (ref 22–32)
Calcium: 8.8 mg/dL — ABNORMAL LOW (ref 8.9–10.3)
Chloride: 105 mmol/L (ref 98–111)
Creatinine, Ser: 0.64 mg/dL (ref 0.44–1.00)
GFR, Estimated: >60 mL/min (ref >60)
Glucose, Bld: 105 mg/dL — ABNORMAL HIGH (ref 70–99)
Potassium: 3.9 mmol/L (ref 3.5–5.1)
Sodium: 137 mmol/L (ref 135–145)

## 2021-11-06 LAB — CBG MONITORING, ED: Glucose-Capillary: 93 mg/dL (ref 70–99)

## 2021-11-06 LAB — RESP PANEL BY RT-PCR (FLU A&B, COVID) ARPGX2
Influenza A by PCR: NEGATIVE
Influenza B by PCR: NEGATIVE
SARS Coronavirus 2 by RT PCR: NEGATIVE

## 2021-11-06 NOTE — ED Provider Notes (Signed)
[facility] DEPT Provider Note   CSN: [id number] Arrival date & time: 11/06/21  1025     History Chief Complaint  Patient presents with   Loss of Consciousness    Alicia [name] is a 85 y.o. female with past medical history significant for baseline Alzheimer's disease who was normally not alert and oriented to time or place who presents after a syncopal episode earlier today.  Husband reports that patient was standing up in the kitchen became pale, appeared to lose consciousness, and slumped slowly to the floor.  Patient did not hit her head, or sustain any other injuries that she was lowered slowly to the floor.  Patient regained consciousness, EMS was called, and patient had 1 additional episode of "falling out".  Patient with very low blood pressure on arrival of EMS, no exact number was recorded.  Patient does endorse that she felt dizzy before she went to sit down.  Patient at this time has no complaints, no pain, and is acting like her normal self per her husband.  Patient denies history of cardiac arrhythmias, sudden cardiac death, stroke, recent fever, or other infectious symptoms.   Loss of Consciousness     Past Medical History:  Diagnosis Date   Alzheimer disease ([place]) 01/17/2019   Anxiety    Arthritis    Frequent UTI    over the last year   Hypertension    Inguinal hernia    right   Shingles    Urinary frequency    over the year   Urinary urgency    Wears glasses     Patient Active Problem List   Diagnosis Date Noted   Alzheimer disease ([place]) 01/17/2019   Spinal stenosis, lumbar region, with neurogenic claudication 10/14/2013    Class: Diagnosis of    Past Surgical History:  Procedure Laterality Date   BUNIONECTOMY     both feet   EYE SURGERY Left 04-2013   cataract Left eye   INGUINAL HERNIA REPAIR Right 09/16/2019   Procedure: LAPAROSCOPIC RIGHT INGUINAL HERNIA REPAIR WITH MESH;  Surgeon: Ralene Ok, MD;   Location: [facility];  Service: General;  Laterality: Right;   LUMBAR LAMINECTOMY N/A 10/14/2013   Procedure: MICRODISCECTOMY LUMBAR LAMINECTOMY/L4-5 Decompression, Excision Extradural Intraspinal Cyst;  Surgeon: Marybelle Killings, MD;  Location: [facility];  Service: Orthopedics;  Laterality: N/A;  L4-5 Decompression, Excision Extradural Intraspinal Cyst     OB History   No obstetric history on file.     Family History  Problem Relation Age of Onset   High blood pressure Mother    Diabetes Sister    High blood pressure Sister    Breast cancer Sister    Colon cancer Neg Hx    Esophageal cancer Neg Hx     Social History   Tobacco Use   Smoking status: Former    Types: Cigarettes   Smokeless tobacco: Never  Vaping Use   Vaping Use: Never used  Substance Use Topics   Alcohol use: Yes    Comment: 1 glass of wine daily   Drug use: No    Home Medications Prior to Admission medications   Medication Sig Start Date End Date Taking? Authorizing Provider  Cholecalciferol (VITAMIN D3) 50 MCG (2000 UT) TABS Take 2,000 Units by mouth daily.    [provider]  COVID-19 mRNA bivalent vaccine, Pfizer, (PFIZER COVID-19 VAC BIVALENT) injection Inject into the muscle. 10/29/21   Carlyle Basques, MD  COVID-19 mRNA Vac-TriS, [place], SUSP  injection Inject into the muscle. 05/19/21   Carlyle Basques, MD  donepezil (ARICEPT) 5 MG tablet Take 1 tablet (5 mg total) by mouth at bedtime. 02/25/21   Suzzanne Cloud, NP  ibuprofen (ADVIL) 200 MG tablet Take 400 mg by mouth every 8 (eight) hours as needed (for pain.).    [provider]  lisinopril (PRINIVIL,ZESTRIL) 20 MG tablet Take 20 mg by mouth daily.    [provider]  memantine (NAMENDA) 10 MG tablet Take 10 mg by mouth 2 (two) times daily. 01/10/19   [provider]  Polyvinyl Alcohol-Povidone (REFRESH OP) Place 1 drop into both eyes daily.    [provider]  rosuvastatin (CRESTOR) 5 MG tablet Take 5 mg by mouth at  bedtime. 02/01/20   [provider]    Allergies    Contrast media [iodinated diagnostic agents]  Review of Systems   Review of Systems  Cardiovascular:  Positive for syncope.  All other systems reviewed and are negative.  Physical Exam Updated Vital Signs BP (!) 148/91 (BP Location: Right Arm)   Pulse 67   Temp 97.9 F (36.6 C)   Resp 14   SpO2 98%   Physical Exam Vitals and nursing note reviewed.  Constitutional:      General: She is not in acute distress.    Appearance: Normal appearance.  HENT:     Head: Normocephalic and atraumatic.  Eyes:     General:        Right eye: No discharge.        Left eye: No discharge.  Cardiovascular:     Rate and Rhythm: Normal rate and regular rhythm.     Heart sounds: No murmur heard.   No friction rub. No gallop.  Pulmonary:     Effort: Pulmonary effort is normal.     Breath sounds: Normal breath sounds.  Abdominal:     General: Bowel sounds are normal.     Palpations: Abdomen is soft.     Comments: No tenderness to palpation of the abdomen  Skin:    General: Skin is warm and dry.     Capillary Refill: Capillary refill takes less than 2 seconds.  Neurological:     Mental Status: She is alert and oriented to person, place, and time.     Comments: CN III through XII grossly intact.  Intact finger-to-nose.  Intact heel-to-shin.  Romberg negative, gait normal.  Patient is alert and oriented only to herself, this is her baseline.  No pronator drift noted.  Intact strength 5 out of 5 bilateral upper and lower extremities.  Psychiatric:        Mood and Affect: Mood normal.        Behavior: Behavior normal.    ED Results / Procedures / Treatments   Labs (all labs ordered are listed, but only abnormal results are displayed) Labs Reviewed  BASIC METABOLIC PANEL - Abnormal; Notable for the following components:      Result Value   Glucose, Bld 105 (*)    Calcium 8.8 (*)    All other components within normal limits   URINALYSIS, ROUTINE W REFLEX MICROSCOPIC - Abnormal; Notable for the following components:   APPearance HAZY (*)    Ketones, ur 5 (*)    Leukocytes,Ua SMALL (*)    Bacteria, UA FEW (*)    All other components within normal limits  RESP PANEL BY RT-PCR (FLU A&B, COVID) ARPGX2  CBC  CBG MONITORING, ED  TROPONIN I (  HIGH SENSITIVITY)  TROPONIN I (HIGH SENSITIVITY)    EKG EKG Interpretation  Date/Time:  Saturday November 06 2021 12:36:59 EST Ventricular Rate:  59 PR Interval:  134 QRS Duration: 95 QT Interval:  493 QTC Calculation: 489 R Axis:   -3 Text Interpretation: Sinus rhythm Nonspecific T abnrm, anterolateral leads Confirmed by Octaviano Glow [id number]) on 11/06/2021 12:55:33 PM  Radiology CT Head Wo Contrast  Result Date: 11/06/2021 CLINICAL DATA:  Head trauma EXAM: CT HEAD WITHOUT CONTRAST TECHNIQUE: Contiguous axial images were obtained from the base of the skull through the vertex without intravenous contrast. COMPARISON:  CT head 07/27/2019 FINDINGS: Brain: No acute intracranial hemorrhage, mass effect, or herniation. No extra-axial fluid collections. No evidence of acute territorial infarct. No hydrocephalus. Mild-to-moderate cortical volume loss. Patchy hypodensities throughout the periventricular and subcortical white matter, likely secondary to chronic microvascular ischemic changes. Vascular: No hyperdense vessel or unexpected calcification. Skull: Normal. Negative for fracture or focal lesion. Sinuses/Orbits: No acute finding. Other: None. IMPRESSION: Chronic changes with no acute intracranial process identified. Electronically Signed   By: Ofilia Neas M.D.   On: 11/06/2021 11:02    Procedures Procedures   Medications Ordered in ED Medications - No data to display  ED Course  I have reviewed the triage vital signs and the nursing notes.  Pertinent labs & imaging results that were available during my care of the patient were reviewed by me and considered in  my medical decision making (see chart for details).  Clinical Course as of 11/06/21 1359  Sat Nov 06, 2021  1333 This is an 85 year old female with a history of dementia presenting from home for near syncope.  Her husband provides supplemental history.  He reports that today she was making coffee and suddenly seem to turn pale and he helped lower her to the ground.  He says that she was very dizzy and lightheaded and could not stand back up for "maybe an hour".  He called EMS who arrived on scene reported the patient had a low blood pressure, is not clear how low.  Subsequently she seems back to normal.  The patient is now asymptomatic and denies any lightheadedness or chest pain.  They both deny that she has had any episodes like this in the past.  She does have resting bradycardia.  Her vital signs here have been normal, aside from borderline sinus bradycardia.  Blood pressures have been fine.  Her blood work has been unremarkable, including troponin levels which are flat.  EKG also shows borderline sinus bradycardia but no evidence of heart block.  We are awaiting a UA to complete her work-up.  Otherwise did not see any evidence of life-threatening infection or illness.  I suspect this may have been a vasovagal episode, or else symptomatic bradycardic episode.  I would place a referral to cardiology to expedite an office appointment.  Both of them verbalized understanding and agreement the plan.  I advised that she use a walker for the next several days as an extra precaution at home. [MT]    Clinical Course User Index [MT] Trifan, Carola Rhine, MD   MDM Rules/Calculators/A&P                         I discussed this case with my attending physician who cosigned this note including patient's presenting symptoms, physical exam, and planned diagnostics and interventions. Attending physician stated agreement with plan or made changes to plan which were implemented.  Attending physician assessed patient at  bedside.  Overall well-appearing 85 year old female with 2 episodes of witnessed syncope preceded by dizziness, with some weakness for an hour after initial collapse.  Patient has no focal neurologic findings, and normal CT scan of the head.  Her lab work is unremarkable including CBC, CMP, urinalysis.  Mildly elevated glucose of 105.  Mildly decreased calcium of 8.8.  There is small leukocytes, rare bacteria noted, however without any urinary symptoms do not believe that this represents an acute urinary tract infection we will not treat at this time.  Patient has had no episodes of witnessed syncope since she has been worked up.  EKGs x2 without any evidence of ischemia, arrhythmia, or other abnormality.  Normal troponin, negative respiratory virus panel.  In context of isolated episode of syncope x2, with subsequent low blood pressure, high suspicion for vasovagal syncope.  However unable to rule out possibility of isolated arrhythmia, or other heart abnormality with small sample size of EKG and monitor.  We will refer patient to cardiology for syncope evaluation.  Patient continues to be able to ambulate, stand without difficulty, or feelings of syncope.  Patient appears stable at this time, we will discharge with return precautions.  Patient has been understand agree to plan. Final Clinical Impression(s) / ED Diagnoses Final diagnoses:  Syncope, unspecified syncope type    Rx / DC Orders ED Discharge Orders          Ordered    Ambulatory referral to Cardiology       Comments: Syncope evaluation   11/06/21 1331             Anselmo Pickler, [place] 11/06/21 1359    Wyvonnia Dusky, MD 11/06/21 1433

## 2021-11-06 NOTE — ED Triage Notes (Signed)
BIB EMS from home, pt was making coffee this AM and had a syncopal episode from standing, husband called EMS for help getting her up. W/ fire pt had another syncopal episode upon sitting up. Pt showing sinus brady w/ EMS. BP w/ EMS 117/66, initially was 90/palp.   EMS got 20 G LFA CBG 165

## 2021-11-06 NOTE — Discharge Instructions (Signed)
As we discussed your work-up today was unremarkable, there is no evidence of a stroke or head bleed on your CT.  Your electrolytes and blood work was unremarkable.  We do not see anything abnormal with your heart on your EKG today.  Dr. Langston Masker I think that you may have had an episode of vasovagal syncope which is benign, it is possible that you could have had some kind of arrhythmia with your heart that we did not capture on our EKG or monitor today so we are referring you to cardiology for further work-up of your syncope.  If you have not heard from them early next week I recommend that you give them a call to check-in.  If you have falls in the meantime, especially if you hit your head or have significant length of loss of consciousness I recommend you return for further evaluation.

## 2021-11-06 NOTE — ED Provider Notes (Signed)
Emergency Medicine Provider Triage Evaluation Note  Alicia [name] , a 85 y.o. female  was evaluated in triage.  Pt has dementia.  Presenting to the emergency department after a syncopal episode at home.  She states that she felt dizzy before he went to sit down however lost consciousness.  Does not believe she hit her head but again patient has dementia.  EMS reports that once they got her off the floor she again fainted.  No history of syncope/seizure.  No history of arrhythmia.  Sinus bradycardia with EMS.  Denies any headache, chest pain or discomfort at this time.  Review of Systems  Positive: Syncope, dizziness Negative: Chest pain, shortness of breath  Demented  Physical Exam  BP 109/70 (BP Location: Left Arm)   Pulse 60   Temp 97.9 F (36.6 C)   Resp 16   SpO2 100%  Gen:   Awake, no distress   Resp:  Normal effort  MSK:   Moves extremities without difficulty  Other:  bradycardic  Medical Decision Making  Medically screening exam initiated at 10:37 AM.  Appropriate orders placed.  Alicia [name] was informed that the remainder of the evaluation will be completed by another provider, this initial triage assessment does not replace that evaluation, and the importance of remaining in the ED until their evaluation is complete.     Darliss Ridgel 11/06/21 1044    Wyvonnia Dusky, MD 11/06/21 [phone]

## 2021-11-11 DIAGNOSIS — I10 Essential (primary) hypertension: Secondary | ICD-10-CM | POA: Diagnosis not present

## 2021-11-11 DIAGNOSIS — Z0000 Encounter for general adult medical examination without abnormal findings: Secondary | ICD-10-CM | POA: Diagnosis not present

## 2021-11-11 DIAGNOSIS — E785 Hyperlipidemia, unspecified: Secondary | ICD-10-CM | POA: Diagnosis not present

## 2021-11-11 DIAGNOSIS — R55 Syncope and collapse: Secondary | ICD-10-CM | POA: Diagnosis not present

## 2021-11-11 DIAGNOSIS — Z23 Encounter for immunization: Secondary | ICD-10-CM | POA: Diagnosis not present

## 2021-11-11 DIAGNOSIS — Z1389 Encounter for screening for other disorder: Secondary | ICD-10-CM | POA: Diagnosis not present

## 2021-12-09 ENCOUNTER — Encounter: Payer: Self-pay | Admitting: Cardiovascular Disease

## 2021-12-09 NOTE — Progress Notes (Signed)
Cardiology Office Note:    Date:  12/10/2021   ID:  Alicia [name], DOB 1936-05-05, MRN [id number]  PCP:  Leeroy Cha, MD   [facility] Providers Cardiologist:  None {    Referring MD: Leeroy Cha,*   Chief Complaint  Patient presents with   Loss of Consciousness   Dementia          History of Present Illness:    Alicia [name] is a 86 y.o. female with a hx of  dementia, HTN She had an episode of syncope on Dec. 10 Called EMS  Went to the ER  Has occurred one other time in the past  Husband is not sure if she was eating or drinking well during the day  Is steadily losing weight   Symptoms of orthostasis No lab evidence of infection Bmp is wnl  Severe dementia  -  husband does most of the talking  Steady decline of weight ,    Husband does the cooking ,  lots of frozen dinners.  Wt was 139 lbs in Oct. 2020 Wt today is 120    Past Medical History:  Diagnosis Date   Alzheimer disease ([place]) 01/17/2019   Anxiety    Arthritis    Frequent UTI    over the last year   Hypertension    Inguinal hernia    right   Shingles    Urinary frequency    over the year   Urinary urgency    Wears glasses     Past Surgical History:  Procedure Laterality Date   BUNIONECTOMY     both feet   EYE SURGERY Left 04-2013   cataract Left eye   INGUINAL HERNIA REPAIR Right 09/16/2019   Procedure: LAPAROSCOPIC RIGHT INGUINAL HERNIA REPAIR WITH MESH;  Surgeon: Ralene Ok, MD;  Location: [facility];  Service: General;  Laterality: Right;   LUMBAR LAMINECTOMY N/A 10/14/2013   Procedure: MICRODISCECTOMY LUMBAR LAMINECTOMY/L4-5 Decompression, Excision Extradural Intraspinal Cyst;  Surgeon: Marybelle Killings, MD;  Location: [place];  Service: Orthopedics;  Laterality: N/A;  L4-5 Decompression, Excision Extradural Intraspinal Cyst    Current Medications: Current Meds  Medication Sig   Cholecalciferol (VITAMIN D3) 50 MCG (2000 UT) TABS Take 2,000 Units by mouth  daily.   donepezil (ARICEPT) 5 MG tablet Take 1 tablet (5 mg total) by mouth at bedtime.   memantine (NAMENDA) 10 MG tablet Take 10 mg by mouth 2 (two) times daily.   polyethylene glycol (MIRALAX / GLYCOLAX) 17 g packet as needed. 1 packet mixed with 8 ounces of fluid   rosuvastatin (CRESTOR) 5 MG tablet Take 5 mg by mouth at bedtime.   [DISCONTINUED] lisinopril (ZESTRIL) 10 MG tablet Take 10 mg by mouth daily.     Allergies:   Contrast media [iodinated contrast media]   Social History   Socioeconomic History   Marital status: Married    Spouse name: Not on file   Number of children: Not on file   Years of education: 2   Highest education level: Some college, no degree  Occupational History   Occupation: retired  Tobacco Use   Smoking status: Former    Types: Cigarettes   Smokeless tobacco: Never  Scientific laboratory technician Use: Never used  Substance and Sexual Activity   Alcohol use: Yes    Comment: 1 glass of wine daily   Drug use: No   Sexual activity: Not Currently  Other Topics Concern   Not on file  Social History Narrative   Right handed   Lives at home with husband    Moderate caffeine usage    Social Determinants of Health   Financial Resource Strain: Not on file  Food Insecurity: Not on file  Transportation Needs: Not on file  Physical Activity: Not on file  Stress: Not on file  Social Connections: Not on file     Family History: The patient's family history includes Breast cancer in her sister; Diabetes in her sister; High blood pressure in her mother and sister. There is no history of Colon cancer or Esophageal cancer.  ROS:   Please see the history of present illness.     All other systems reviewed and are negative.  EKGs/Labs/Other Studies Reviewed:    The following studies were reviewed today:   EKG:    Recent Labs: 11/06/2021: BUN 12; Creatinine, Ser 0.64; Hemoglobin 13.5; Platelets 171; Potassium 3.9; Sodium 137  Recent Lipid Panel No results  found for: CHOL, TRIG, HDL, CHOLHDL, VLDL, LDLCALC, LDLDIRECT   Risk Assessment/Calculations:           Physical Exam:    VS:  BP 116/68 (BP Location: Right Arm, Patient Position: Sitting, Cuff Size: Normal)    Pulse 68    Ht 5' 6"  (1.676 m)    Wt 120 lb 3.2 oz (54.5 kg)    SpO2 95%    BMI 19.40 kg/m     Wt Readings from Last 3 Encounters:  12/10/21 120 lb 3.2 oz (54.5 kg)  02/25/21 140 lb (63.5 kg)  02/25/20 143 lb (64.9 kg)     GEN: elderly female,  pleasantly demented  HEENT: Normal NECK: No JVD; No carotid bruits LYMPHATICS: No lymphadenopathy CARDIAC: RR .   Soft systolic murmur radiating out to the left ax line  RESPIRATORY:  Clear to auscultation without rales, wheezing or rhonchi  ABDOMEN: Soft, non-tender, non-distended MUSCULOSKELETAL:  No edema; No deformity  SKIN: Warm and dry NEUROLOGIC:  Alert and oriented x 3 PSYCHIATRIC:  Normal affect   ASSESSMENT:    No diagnosis found. PLAN:    In order of problems listed above:  Near syncope/syncope: Patient had an episode of near syncope/syncope.  I suspect this with orthostatic hypotension.  She is lost 20 to 30 pounds over the past couple years and is still on lisinopril.  I suspect this is causing her to become hypotensive.  We will discontinue the lisinopril.  I have encouraged her to drink a small can of V8 juice every day for lunch.  We will see if this helps.  I will plan on seeing her again in 6 months to see me or an APP.  If she has still has recurrent symptoms then we will consider doing an event monitor.          Medication Adjustments/Labs and Tests Ordered: Current medicines are reviewed at length with the patient today.  Concerns regarding medicines are outlined above.  No orders of the defined types were placed in this encounter.  No orders of the defined types were placed in this encounter.   Patient Instructions  Medication Instructions:  Your physician has recommended you make the following  change in your medication:  1-STOP Lisinopril  *If you need a refill on your cardiac medications before your next appointment, please call your pharmacy*  Lab Work: If you have labs (blood work) drawn today and your tests are completely normal, you will receive your results only by: [place] (if you have  MyChart) OR A paper copy in the mail If you have any lab test that is abnormal or we need to change your treatment, we will call you to review the results.  Testing/Procedures: None ordered today.  Follow-Up: At [facility], you and your health needs are our priority.  As part of our continuing mission to provide you with exceptional heart care, we have created designated Provider Care Teams.  These Care Teams include your primary Cardiologist (physician) and Advanced Practice Providers (APPs -  Physician Assistants and Nurse Practitioners) who all work together to provide you with the care you need, when you need it.  We recommend signing up for the patient portal called "MyChart".  Sign up information is provided on this After Visit Summary.  MyChart is used to connect with patients for Virtual Visits (Telemedicine).  Patients are able to view lab/test results, encounter notes, upcoming appointments, etc.  Non-urgent messages can be sent to your provider as well.   To learn more about what you can do with MyChart, go to [email or web address].    Your next appointment:   6 month(s)  The format for your next appointment:   In Person  Provider:   Christen Bame, NP or Richardson Dopp, PA-C     Then, Dr. Acie Fredrickson will plan to see you again in 1 year(s).  Other Instructions Please Drink a V8 juice every day at lunch.   Signed, Mertie Moores, MD  12/10/2021 5:11 PM    [place]

## 2021-12-10 ENCOUNTER — Ambulatory Visit: Payer: Medicare Other | Admitting: Cardiovascular Disease

## 2021-12-10 ENCOUNTER — Encounter: Payer: Self-pay | Admitting: Cardiovascular Disease

## 2021-12-10 ENCOUNTER — Other Ambulatory Visit: Payer: Self-pay

## 2021-12-10 DIAGNOSIS — I951 Orthostatic hypotension: Secondary | ICD-10-CM | POA: Insufficient documentation

## 2021-12-10 DIAGNOSIS — R55 Syncope and collapse: Secondary | ICD-10-CM | POA: Diagnosis not present

## 2021-12-10 NOTE — Patient Instructions (Signed)
Medication Instructions:  Your physician has recommended you make the following change in your medication:  1-STOP Lisinopril  *If you need a refill on your cardiac medications before your next appointment, please call your pharmacy*  Lab Work: If you have labs (blood work) drawn today and your tests are completely normal, you will receive your results only by: [place] (if you have MyChart) OR A paper copy in the mail If you have any lab test that is abnormal or we need to change your treatment, we will call you to review the results.  Testing/Procedures: None ordered today.  Follow-Up: At [facility], you and your health needs are our priority.  As part of our continuing mission to provide you with exceptional heart care, we have created designated Provider Care Teams.  These Care Teams include your primary Cardiologist (physician) and Advanced Practice Providers (APPs -  Physician Assistants and Nurse Practitioners) who all work together to provide you with the care you need, when you need it.  We recommend signing up for the patient portal called "MyChart".  Sign up information is provided on this After Visit Summary.  MyChart is used to connect with patients for Virtual Visits (Telemedicine).  Patients are able to view lab/test results, encounter notes, upcoming appointments, etc.  Non-urgent messages can be sent to your provider as well.   To learn more about what you can do with MyChart, go to [email or web address].    Your next appointment:   6 month(s)  The format for your next appointment:   In Person  Provider:   Christen Bame, NP or Richardson Dopp, PA-C     Then, Dr. Acie Fredrickson will plan to see you again in 1 year(s).  Other Instructions Please Drink a V8 juice every day at lunch.

## 2022-01-17 DIAGNOSIS — E782 Mixed hyperlipidemia: Secondary | ICD-10-CM | POA: Diagnosis not present

## 2022-01-17 DIAGNOSIS — I10 Essential (primary) hypertension: Secondary | ICD-10-CM | POA: Diagnosis not present

## 2022-01-19 DIAGNOSIS — R935 Abnormal findings on diagnostic imaging of other abdominal regions, including retroperitoneum: Secondary | ICD-10-CM | POA: Diagnosis not present

## 2022-01-19 DIAGNOSIS — K5730 Diverticulosis of large intestine without perforation or abscess without bleeding: Secondary | ICD-10-CM | POA: Diagnosis not present

## 2022-01-19 DIAGNOSIS — R197 Diarrhea, unspecified: Secondary | ICD-10-CM | POA: Diagnosis not present

## 2022-01-19 DIAGNOSIS — R350 Frequency of micturition: Secondary | ICD-10-CM | POA: Diagnosis not present

## 2022-01-20 ENCOUNTER — Other Ambulatory Visit: Payer: Self-pay | Admitting: Internal Medicine

## 2022-01-20 DIAGNOSIS — R935 Abnormal findings on diagnostic imaging of other abdominal regions, including retroperitoneum: Secondary | ICD-10-CM

## 2022-01-21 DIAGNOSIS — R350 Frequency of micturition: Secondary | ICD-10-CM | POA: Diagnosis not present

## 2022-01-24 ENCOUNTER — Telehealth: Payer: Self-pay

## 2022-01-24 MED ORDER — PREDNISONE 50 MG PO TABS: ORAL_TABLET | ORAL | 0 refills | Status: DC

## 2022-01-24 MED ORDER — DIPHENHYDRAMINE HCL 50 MG PO TABS: 50.0000 mg | ORAL_TABLET | Freq: Once | ORAL | 0 refills | Status: DC

## 2022-01-24 NOTE — Telephone Encounter (Signed)
LMOM on husband's phone that I sent in an order to the [facility] in [place] for a 13-hr prep for patient for tomorrow's CT.  Prednisone 52m 01/24/22 at 2215, 01/25/22 at 0415 and 1015.  Benadryl 532m2/28/23 at 1015.

## 2022-01-25 ENCOUNTER — Ambulatory Visit
Admission: RE | Admit: 2022-01-25 | Discharge: 2022-01-25 | Disposition: A | Discharge status: Home / Self Care | Payer: Medicare Other | Source: Ambulatory Visit | Attending: Internal Medicine | Admitting: Internal Medicine

## 2022-01-25 DIAGNOSIS — M4316 Spondylolisthesis, lumbar region: Secondary | ICD-10-CM | POA: Diagnosis not present

## 2022-01-25 DIAGNOSIS — I700 Atherosclerosis of aorta: Secondary | ICD-10-CM | POA: Diagnosis not present

## 2022-01-25 DIAGNOSIS — K5730 Diverticulosis of large intestine without perforation or abscess without bleeding: Secondary | ICD-10-CM | POA: Diagnosis not present

## 2022-01-25 DIAGNOSIS — K5900 Constipation, unspecified: Secondary | ICD-10-CM | POA: Diagnosis not present

## 2022-01-25 DIAGNOSIS — R935 Abnormal findings on diagnostic imaging of other abdominal regions, including retroperitoneum: Secondary | ICD-10-CM

## 2022-01-25 MED ORDER — IOPAMIDOL (ISOVUE-300) INJECTION 61%
80.0000 mL | Freq: Once | INTRAVENOUS | Status: AC | PRN
  Administered 2022-01-25: 80 mL via INTRAVENOUS

## 2022-02-15 DIAGNOSIS — I10 Essential (primary) hypertension: Secondary | ICD-10-CM | POA: Diagnosis not present

## 2022-02-15 DIAGNOSIS — E782 Mixed hyperlipidemia: Secondary | ICD-10-CM | POA: Diagnosis not present

## 2022-02-21 ENCOUNTER — Encounter: Payer: Self-pay | Admitting: Physician Assistant

## 2022-02-21 ENCOUNTER — Ambulatory Visit: Payer: Medicare Other | Admitting: Physician Assistant

## 2022-02-21 ENCOUNTER — Other Ambulatory Visit: Payer: Self-pay

## 2022-02-21 ENCOUNTER — Ambulatory Visit (INDEPENDENT_AMBULATORY_CARE_PROVIDER_SITE_OTHER)
Admission: RE | Admit: 2022-02-21 | Discharge: 2022-02-21 | Disposition: A | Discharge status: Home / Self Care | Payer: Medicare Other | Source: Ambulatory Visit | Attending: Physician Assistant | Admitting: Physician Assistant

## 2022-02-21 VITALS — BP 104/64 | HR 72 | Ht 66.0 in | Wt 118.4 lb

## 2022-02-21 DIAGNOSIS — K5900 Constipation, unspecified: Secondary | ICD-10-CM

## 2022-02-21 DIAGNOSIS — R159 Full incontinence of feces: Secondary | ICD-10-CM

## 2022-02-21 NOTE — Patient Instructions (Addendum)
If you are age 84 or older, your body mass index should be between 23-30. Your Body mass index is 19.11 kg/m?Marland Kitchen If this is out of the aforementioned range listed, please consider follow up with your Primary Care Provider. ? ?If you are age 28 or younger, your body mass index should be between 19-25. Your Body mass index is 19.11 kg/m?Marland Kitchen If this is out of the aformentioned range listed, please consider follow up with your Primary Care Provider.  ? ?________________________________________________________ ? ?The [facility] providers would like to encourage you to use [facility] to communicate with providers for non-urgent requests or questions.  Due to long hold times on the telephone, sending your provider a message by [facility] may be a faster and more efficient way to get a response.  Please allow 48 business hours for a response.  Please remember that this is for non-urgent requests.  ?_______________________________________________________ ? ?Your provider has requested that you go to the basement level for an xray before leaving today. Press "B" on the elevator.  Xray is located on the right as you exit the elevator. ? ?Use protective undergarments such as Depends ? ?

## 2022-02-21 NOTE — Progress Notes (Signed)
? ?Chief Complaint: Fecal incontinence and constipation ? ?HPI: ?   Alicia [name] is an 86 year old female with a past medical history of Alzheimer's disease and others listed below, known to Dr. Loletha Carrow, who was referred to me by Leeroy Cha,* for a complaint of fecal incontinence and constipation. ?   Patient seen in the ED 07/27/2019 for nausea, vomiting and right lower quadrant pain with a CT showing wall thickening of the terminal ileum with minimal adjacent fat stranding, which may indicate a mild ileitis.  Mild diffuse small bowel dilation secondary to inflammation of the terminal ileum with no high-grade obstruction.  Fluid containing right inguinal hernia.  Labs at that time including CBC and BMP were normal.  Hepatic function panel showed total bili elevation at 2. ? 08/09/2019 CT abdomen pelvis with and without contrast for right lower quadrant abdominal pain and prior ileitis with abnormal wall thickening and a 4.5 cm segment of small bowel approaching the ileocecal valve, suspicious for terminal ileitis and potentially reflecting Crohn's disease.  A small bowel malignancy/mass is a differential diagnostic consideration although less likely given the regional inflammatory stranding shown on the prior exam.  No abscess or extraluminal gas identified.  There is also an indirect right inguinal hernia containing a loop of distal ileum.  The loop was proximal to the inflamed loop.  Did not see any findings of obstruction or overt strangulation at this time but the herniated loops of small bowel could possibly be contributing to the patient's right lower quadrant symptoms.  Bilateral nonobstructive renal calculi.  Sigmoid diverticulosis without active diverticulitis.  Aortic atherosclerosis and right foraminal impingement of L1-2. ?   08/27/2019 patient seen in clinic by me at that time described right lower quadrant pain for 6 months.  At that time Dr. Loletha Carrow reviewed CT.  There is question regarding  ileitis +/- a small mass in the area versus less likely Crohn's.  Was recommended she have a colonoscopy 6 weeks after her hernia repair in mid November.  This was scheduled Dr. Loletha Carrow. ?   09/16/2019 patient underwent laparoscopic right inguinal hernia repair with mesh. ?   (It does not appear patient ever return to clinic for colonoscopy). ?   01/26/2022 CT the abdomen pelvis with contrast done for constipation for a year and abdominal pain for a week.  At that time CT showed no evidence of bowel wall thickening or dilation, small amount of stool throughout the colon with an 8 cm rectal stool ball and scattered colonic diverticulosis. ?   Today, the patient presents to clinic accompanied by her husband who is her caretaker given that she has Alzheimer's.  He explains that over the past couple of months they have been having trouble with fecal incontinence.  He tells me that she can just be walking through the room and will have a solid ball-like bowel movement and does not even know that she did.  It can often happen 3-4 times during the day/night.  Tells me it has been a lot for him to clean up and take care of.  Previous to this patient describes a history of constipation for which they were on MiraLAX but he is not currently giving her any now as it seemed to give her loose stools.  Associated symptoms include some occasional complaints of abdominal pain, especially when she is trying to pass the stool. ?   Denies fever, chills, blood in her stool, nausea or vomiting. ? ?Past Medical History:  ?Diagnosis Date  ?  Alzheimer disease ([place]) 01/17/2019  ? Anxiety   ? Arthritis   ? Frequent UTI   ? over the last year  ? Hypertension   ? Inguinal hernia   ? right  ? Shingles   ? Urinary frequency   ? over the year  ? Urinary urgency   ? Wears glasses   ? ? ?Past Surgical History:  ?Procedure Laterality Date  ? BUNIONECTOMY    ? both feet  ? EYE SURGERY Left 04-2013  ? cataract Left eye  ? INGUINAL HERNIA REPAIR Right  09/16/2019  ? Procedure: LAPAROSCOPIC RIGHT INGUINAL HERNIA REPAIR WITH MESH;  Surgeon: Ralene Ok, MD;  Location: [place] [facility];  Service: General;  Laterality: Right;  ? LUMBAR LAMINECTOMY N/A 10/14/2013  ? Procedure: MICRODISCECTOMY LUMBAR LAMINECTOMY/L4-5 Decompression, Excision Extradural Intraspinal Cyst;  Surgeon: Marybelle Killings, MD;  Location: [place];  Service: Orthopedics;  Laterality: N/A;  L4-5 Decompression, Excision Extradural Intraspinal Cyst  ? ? ?Current Outpatient Medications  ?Medication Sig Dispense Refill  ? Cholecalciferol (VITAMIN D3) 50 MCG (2000 UT) TABS Take 2,000 Units by mouth daily.    ? diphenhydrAMINE (BENADRYL) 50 MG tablet Take 1 tablet (50 mg total) by mouth once for 1 dose. 1 tablet 0  ? donepezil (ARICEPT) 5 MG tablet Take 1 tablet (5 mg total) by mouth at bedtime. 90 tablet 3  ? ibuprofen (ADVIL) 200 MG tablet Take 400 mg by mouth every 8 (eight) hours as needed (for pain.). (Patient not taking: Reported on 12/10/2021)    ? memantine (NAMENDA) 10 MG tablet Take 10 mg by mouth 2 (two) times daily.    ? polyethylene glycol (MIRALAX / GLYCOLAX) 17 g packet as needed. 1 packet mixed with 8 ounces of fluid    ? Polyvinyl Alcohol-Povidone (REFRESH OP) Place 1 drop into both eyes daily. (Patient not taking: Reported on 12/10/2021)    ? predniSONE (DELTASONE) 50 MG tablet Pt to take 50 mg of prednisone on 01/24/22 at 10:15pm, 50 mg of prednisone on 01/25/22 at 4:15am, and 50 mg of prednisone on 01/25/22 at 10:15am. Pt is also to take 50 mg of benadryl on 01/25/22 at 10:15am. Please call [phone] with any questions. 3 tablet 0  ? rosuvastatin (CRESTOR) 5 MG tablet Take 5 mg by mouth at bedtime.    ? ?No current facility-administered medications for this visit.  ? ? ?Allergies as of 02/21/2022 - Review Complete 12/10/2021  ?Allergen Reaction Noted  ? Contrast media [iodinated contrast media]  08/27/2019  ? ? ?Family History  ?Problem Relation Age of Onset  ? High blood pressure Mother   ? Diabetes  Sister   ? High blood pressure Sister   ? Breast cancer Sister   ? Colon cancer Neg Hx   ? Esophageal cancer Neg Hx   ? ? ?Social History  ? ?Socioeconomic History  ? Marital status: Married  ?  Spouse name: Not on file  ? Number of children: Not on file  ? Years of education: 2  ? Highest education level: Some college, no degree  ?Occupational History  ? Occupation: retired  ?Tobacco Use  ? Smoking status: Former  ?  Types: Cigarettes  ? Smokeless tobacco: Never  ?Vaping Use  ? Vaping Use: Never used  ?Substance and Sexual Activity  ? Alcohol use: Yes  ?  Comment: 1 glass of wine daily  ? Drug use: No  ? Sexual activity: Not Currently  ?Other Topics Concern  ? Not on file  ?Social History  Narrative  ? Right handed  ? Lives at home with husband   ? Moderate caffeine usage   ? ?Social Determinants of Health  ? ?Financial Resource Strain: Not on file  ?Food Insecurity: Not on file  ?Transportation Needs: Not on file  ?Physical Activity: Not on file  ?Stress: Not on file  ?Social Connections: Not on file  ?Intimate Partner Violence: Not on file  ? ? ?Review of Systems:    ?Constitutional: No weight loss, fever or chills ?Cardiovascular: No chest pain ?Respiratory: No SOB ?Gastrointestinal: See HPI and otherwise negative ? ? Physical Exam:  ?Vital signs: ?BP 104/64   Pulse 72   Ht 5' 6"  (1.676 m)   Wt 118 lb 6.4 oz (53.7 kg)   BMI 19.11 kg/m?   ? ?Constitutional:   Pleasantly demented, elderly Caucasian female appears to be in NAD, Well developed, Well nourished, alert and cooperative ?Respiratory: Respirations even and unlabored. Lungs clear to auscultation bilaterally.   No wheezes, crackles, or rhonchi.  ?Cardiovascular: Normal S1, S2. No MRG. Regular rate and rhythm. No peripheral edema, cyanosis or pallor.  ?Gastrointestinal:  Soft, nondistended, nontender. No rebound or guarding. Normal bowel sounds. No appreciable masses or hepatomegaly. ?Rectal:  Not performed.  ?Psychiatric: +alzheimers ? ?Most recent LABS  AND SEE IMAGING in HPI: ?CBC ?   ?Component Value Date/Time  ? WBC 5.4 11/06/2021 1120  ? RBC 4.23 11/06/2021 1120  ? HGB 13.5 11/06/2021 1120  ? HCT 40.3 11/06/2021 1120  ? PLT 171 11/06/2021 1120  ? MCV 95.3 12/1

## 2022-02-22 ENCOUNTER — Other Ambulatory Visit: Payer: Self-pay

## 2022-02-22 NOTE — Progress Notes (Signed)
____________________________________________________________ ? ?Attending physician addendum: ? ?Thank you for sending this case to me. ?I have reviewed the entire note and agree with the plan. ? ?I agree this appears to be overflow incontinence , and I reviewed the recent CTAP report. ?She almost certainly has decreased anorectal sphincter tone and sensation made more challenging with her dementia. ?The bowel purge should start with (any may only need to be) 2 fleets enemas to try for rectal clearance and relief of overflow. (Creatinine normal Dec 2022, for purposes of using fleets enemas). ? ?Wilfrid Lund, MD ? ?____________________________________________________________ ? ?

## 2022-02-23 ENCOUNTER — Telehealth: Payer: Self-pay

## 2022-02-23 NOTE — Telephone Encounter (Signed)
Called and spoke with patient's husband. He reports that the pt completed the Miralax bowel purge last night, took her about 2 hours to complete due to the taste. Pt's husband states that she had good results and had a lot of diarrhea with the purge. He states that her stools are light but not quite clear yet. He has started on the daily dose of Miralax. He knows to make sure patient stays hydrated since her colon is being cleansed. Pt's husband will contact us if he has any concerns. Pt's husband verbalized understanding and had no concerns at the end of the call. ?

## 2022-02-23 NOTE — Telephone Encounter (Addendum)
-----   Message from Levin Erp, [place] sent at 02/23/2022  8:35 AM EDT ----- ?Regarding: Please call and check on ?Please call and check on this patient.  If he has not already done the MiraLAX purge then can start with enemas instead as per recommendations from Dr. Loletha Carrow.  Please see how she is doing. ? ?Thanks, JLL ?----- Message ----- ?From: Doran Stabler, MD ?Sent: 02/22/2022   4:06 PM EDT ?To: Levin Erp, PA ? ?  ?Attending physician addendum: ?  ?Thank you for sending this case to me. ?I have reviewed the entire note and agree with the plan. ?  ?I agree this appears to be overflow incontinence , and I reviewed the recent CTAP report. ?She almost certainly has decreased anorectal sphincter tone and sensation made more challenging with her dementia. ?The bowel purge should start with (any may only need to be) 2 fleets enemas to try for rectal clearance and relief of overflow. (Creatinine normal Dec 2022, for purposes of using fleets enemas). ?  ?Wilfrid Lund, MD ? ? ?

## 2022-02-25 NOTE — Telephone Encounter (Signed)
Patient spouse called to update on wife. Please advise. ?

## 2022-02-28 NOTE — Telephone Encounter (Signed)
Lm on vm for patient's husband to return call. ?

## 2022-03-01 NOTE — Telephone Encounter (Signed)
Pt's husband returned call. He reports that pt seems to be doing better after the bowel purge and Miralax daily. He reports that pt is eating and drinking fine. He reports that she did not have a BM at all yesterday. I told it will take some time for her to get back to having regular BM's. Denies any abdominal pain or new symptoms. Pt's husband will contact us if anything changes or if he has any new concerns. Pt verbalized understanding and had no concerns at the end of the call.  ?

## 2022-03-02 ENCOUNTER — Encounter: Payer: Self-pay | Admitting: Neurology

## 2022-03-02 ENCOUNTER — Ambulatory Visit: Payer: Medicare Other | Admitting: Neurology

## 2022-03-02 VITALS — BP 124/73 | HR 66 | Ht 66.0 in | Wt 120.0 lb

## 2022-03-02 DIAGNOSIS — G309 Alzheimer's disease, unspecified: Secondary | ICD-10-CM | POA: Diagnosis not present

## 2022-03-02 DIAGNOSIS — F0280 Dementia in other diseases classified elsewhere without behavioral disturbance: Secondary | ICD-10-CM | POA: Diagnosis not present

## 2022-03-02 MED ORDER — DONEPEZIL HCL 10 MG PO TABS: 10.0000 mg | ORAL_TABLET | Freq: Every day | ORAL | 1 refills | Status: DC

## 2022-03-02 MED ORDER — MEMANTINE HCL 10 MG PO TABS: 10.0000 mg | ORAL_TABLET | Freq: Two times a day (BID) | ORAL | 1 refills | Status: AC

## 2022-03-02 NOTE — Patient Instructions (Signed)
Continue current medications, watch for diarrhea or dizziness with higher dose Aricept  ?Watch her weight, sometimes the Aricept can result in a decrease in appetite thus weight loss ?Continue to see you primary care doctor or doctor at facility ?Return here as needed  ?

## 2022-03-02 NOTE — Progress Notes (Signed)
? ? ?PATIENT: Bud Face ?DOB: [birth year] ? ?REASON FOR VISIT: follow up for Alzheimer's disease ?HISTORY FROM: patient, husband  ?PRIMARY NEUROLOGIST: Dr. Jannifer Franklin  ? ?HISTORY OF PRESENT ILLNESS: ?Today 03/02/22 ?Ms. Passow here today for follow-up. Trouble with word finding, progressing overtime. Takes Aricept 10 mg at bedtime, this was mistake by her husband not cutting them, she has done well however. Denies dizziness or diarrhea. Takes Namenda 10 mg once daily, easier to remember. Husband does the driving. Don't cook much anymore. Eat frozen dinner and eat out. She does housework. Living in home, but getting to sell home and move to [facility] [facility] independent living in few weeks. In 2 years lost 20 lbs, but has good appetite, mostly eat 2 meals a day.  ? ?Update 02/25/2021 SS: Ms. Ertle is an 86 year old female with history of memory disturbance and word finding difficulty.  She is on low-dose Aricept and Namenda.  Higher dose Aricept resulted in dizziness.  Since last seen, remains overall stable, main issue is word finding difficulty, trouble expressing herself.  She remains active, cooks 3 meals a day, does her own ADLs and manages the household.  Her husband does help with medications.  She is no longer driving, her husband does this.  She and her husband still live in their home, they have been married for 3 years.  She sleeps well, denies any falls.  They have a handicapped son who lives in town, another son in [place].  They are been encouraged to move to a senior living community, not sure they are ready for this.  Here today for evaluation accompanied by her husband. ? ?Update 02/25/2020 SS: Ms. Wimbish is an 86 year old female with history of memory disturbance and word finding difficulty.  She remains on Aricept and Namenda.  She is on Aricept 5 mg, due to report of dizziness. Since last seen, memory has remained the same, she has continued difficulties with word finding and expressing  herself.  She remains quite active, cooks 3 meals a day, does all of her own ADLs, and housework. She does her own medications.  She continues to drive only short distances, her husband usually does the driving.  She denies any falls, reports sleeps very well.  She says she is overall very happy. MMSE attempted, she started crying. She presents today accompanied by her husband. ? ?HISTORY ?08/28/2019 SS: Ms. Rybacki is an 86 year old female with history of memory disturbance and word finding difficulty.  She is here with her husband today.  She reports she continues to have difficulty with word finding.  She is still quite active.  She is able to perform all her own ADLs, and do cooking in the home.  She is even able to continue driving, but only short distances to places that she is familiar with.  She does manage her medications.  She denies any trouble with her walking or balance.  She has not had any falls.  She says with Covid, she has not been able to go out with her friends.  She does still talk on the phone with friends, and reports she may have difficulty finding the right words.  She remains on Namenda and Aricept.  She did have to decrease her dose of Aricept to 5 mg daily due to report of dizziness.  She is planning to have surgery in the next few days to have hernia repair.  She indicates she sleeps well at night.  She presents today for follow-up accompanied  by her husband.  ? ?REVIEW OF SYSTEMS: Out of a complete 14 system review of symptoms, the patient complains only of the following symptoms, and all other reviewed systems are negative. ? ?See HPI ? ?ALLERGIES: ?Allergies  ?Allergen Reactions  ? Contrast Media [Iodinated Contrast Media]   ?  unknown  ? ? ?HOME MEDICATIONS: ?Outpatient Medications Prior to Visit  ?Medication Sig Dispense Refill  ? Cholecalciferol (VITAMIN D3) 50 MCG (2000 UT) TABS Take 2,000 Units by mouth daily.    ? donepezil (ARICEPT) 5 MG tablet Take 1 tablet (5 mg total) by mouth  at bedtime. 90 tablet 3  ? ibuprofen (ADVIL) 200 MG tablet Take 400 mg by mouth every 8 (eight) hours as needed (for pain.).    ? memantine (NAMENDA) 10 MG tablet Take 10 mg by mouth 2 (two) times daily.    ? polyethylene glycol (MIRALAX / GLYCOLAX) 17 g packet daily. 1 packet mixed with 8 ounces of fluid    ? rosuvastatin (CRESTOR) 5 MG tablet Take 5 mg by mouth at bedtime.    ? Polyvinyl Alcohol-Povidone (REFRESH OP) Place 1 drop into both eyes daily.    ? ?No facility-administered medications prior to visit.  ? ? ?PAST MEDICAL HISTORY: ?Past Medical History:  ?Diagnosis Date  ? Alzheimer disease ([place]) 01/17/2019  ? Anxiety   ? Arthritis   ? Frequent UTI   ? over the last year  ? Hypertension   ? Inguinal hernia   ? right  ? Shingles   ? Urinary frequency   ? over the year  ? Urinary urgency   ? Wears glasses   ? ? ?PAST SURGICAL HISTORY: ?Past Surgical History:  ?Procedure Laterality Date  ? BUNIONECTOMY    ? both feet  ? EYE SURGERY Left 04-2013  ? cataract Left eye  ? INGUINAL HERNIA REPAIR Right 09/16/2019  ? Procedure: LAPAROSCOPIC RIGHT INGUINAL HERNIA REPAIR WITH MESH;  Surgeon: Ralene Ok, MD;  Location: [facility];  Service: General;  Laterality: Right;  ? LUMBAR LAMINECTOMY N/A 10/14/2013  ? Procedure: MICRODISCECTOMY LUMBAR LAMINECTOMY/L4-5 Decompression, Excision Extradural Intraspinal Cyst;  Surgeon: Marybelle Killings, MD;  Location: [facility];  Service: Orthopedics;  Laterality: N/A;  L4-5 Decompression, Excision Extradural Intraspinal Cyst  ? ? ?FAMILY HISTORY: ?Family History  ?Problem Relation Age of Onset  ? High blood pressure Mother   ? Diabetes Sister   ? High blood pressure Sister   ? Breast cancer Sister   ? Colon cancer Neg Hx   ? Esophageal cancer Neg Hx   ? ? ?SOCIAL HISTORY: ?Social History  ? ?Socioeconomic History  ? Marital status: Married  ?  Spouse name: Not on file  ? Number of children: Not on file  ? Years of education: 2  ? Highest education level: Some college, no degree  ?Occupational  History  ? Occupation: retired  ?Tobacco Use  ? Smoking status: Former  ?  Types: Cigarettes  ? Smokeless tobacco: Never  ?Vaping Use  ? Vaping Use: Never used  ?Substance and Sexual Activity  ? Alcohol use: Yes  ?  Alcohol/week: 3.0 standard drinks  ?  Types: 3 Glasses of wine per week  ? Drug use: No  ? Sexual activity: Not Currently  ?Other Topics Concern  ? Not on file  ?Social History Narrative  ? Right handed  ? Lives at home with husband   ? Moderate caffeine usage   ? ?Social Determinants of Health  ? ?Financial Resource Strain: Not  on file  ?Food Insecurity: Not on file  ?Transportation Needs: Not on file  ?Physical Activity: Not on file  ?Stress: Not on file  ?Social Connections: Not on file  ?Intimate Partner Violence: Not on file  ? ?PHYSICAL EXAM ? ?Vitals:  ? 03/02/22 1111  ?BP: 124/73  ?Pulse: 66  ?Weight: 120 lb (54.4 kg)  ?Height: 5' 6"  (1.676 m)  ? ?Body mass index is 19.37 kg/m?. ? ?Generalized: Well developed, in no acute distress  ? ?  02/25/2020  ? 11:16 AM 08/28/2019  ?  2:26 PM 08/28/2019  ?  1:52 PM  ?MMSE - Mini Mental State Exam  ?Not completed:   Unable to complete  ?Orientation to time 3 1   ?Orientation to Place 2 1   ?Registration 2 3   ?Attention/ Calculation 0 0   ?Recall 1 0   ?Language- name 2 objects 2 2   ?Language- repeat 0 0   ?Language- follow 3 step command 3 3   ?Language- read & follow direction 0 1   ?Write a sentence 0 1   ?Copy design 0 1   ?Total score 13 13   ? ? ?Neurological examination  ?Mentation: Alert, history mostly provided by her husband due to significant word finding difficulty, well-appearing, very pleasant, cooperative, mild to moderate difficulty with exam commands ?Cranial nerve II-XII: Pupils were equal round reactive to light. Extraocular movements were full, visual field were full on confrontational test. Facial sensation and strength were normal.  Head turning and shoulder shrug were normal and symmetric. ?Motor: Good strength of all  extremities ?Sensory: Sensory testing is intact to soft touch on all 4 extremities. No evidence of extinction is noted.  ?Coordination: Apraxia with these commands ?Gait and station: Gait is slightly wide-based, but steady and

## 2022-03-18 DIAGNOSIS — G301 Alzheimer's disease with late onset: Secondary | ICD-10-CM | POA: Diagnosis not present

## 2022-03-18 DIAGNOSIS — E782 Mixed hyperlipidemia: Secondary | ICD-10-CM | POA: Diagnosis not present

## 2022-03-18 DIAGNOSIS — I10 Essential (primary) hypertension: Secondary | ICD-10-CM | POA: Diagnosis not present

## 2022-04-21 ENCOUNTER — Ambulatory Visit: Payer: Medicare Other | Admitting: Podiatry

## 2022-05-03 DIAGNOSIS — G301 Alzheimer's disease with late onset: Secondary | ICD-10-CM | POA: Diagnosis not present

## 2022-05-03 DIAGNOSIS — E782 Mixed hyperlipidemia: Secondary | ICD-10-CM | POA: Diagnosis not present

## 2022-05-03 DIAGNOSIS — I10 Essential (primary) hypertension: Secondary | ICD-10-CM | POA: Diagnosis not present

## 2022-05-11 ENCOUNTER — Ambulatory Visit: Payer: Medicare Other | Admitting: Podiatry

## 2022-05-11 ENCOUNTER — Encounter: Payer: Self-pay | Admitting: Podiatry

## 2022-05-11 DIAGNOSIS — M79675 Pain in left toe(s): Secondary | ICD-10-CM | POA: Diagnosis not present

## 2022-05-11 DIAGNOSIS — B351 Tinea unguium: Secondary | ICD-10-CM

## 2022-05-11 DIAGNOSIS — M79674 Pain in right toe(s): Secondary | ICD-10-CM

## 2022-05-11 NOTE — Progress Notes (Signed)
Subjective:   Patient ID: Bud Face, female   DOB: 86 y.o.   MRN: [id number]   HPI Patient presents with severe nail disease 1-5 both feet that are thick dystrophic and painful   ROS      Objective:  Physical Exam  Neurovascular status intact with significant elongation of the nailbeds 1-5 both feet that are thick yellow brittle and painful when pressed     Assessment:  Chronic mycotic nail infection 1-5 both feet with pain     Plan:  Debridement of nailbeds no angiogenic bleeding reappoint routine care

## 2022-05-12 ENCOUNTER — Ambulatory Visit
Admission: RE | Admit: 2022-05-12 | Discharge: 2022-05-12 | Disposition: A | Discharge status: Home / Self Care | Payer: Medicare Other | Source: Ambulatory Visit | Attending: Internal Medicine | Admitting: Internal Medicine

## 2022-05-12 ENCOUNTER — Other Ambulatory Visit: Payer: Self-pay | Admitting: Internal Medicine

## 2022-05-12 DIAGNOSIS — K5730 Diverticulosis of large intestine without perforation or abscess without bleeding: Secondary | ICD-10-CM | POA: Diagnosis not present

## 2022-05-12 DIAGNOSIS — E785 Hyperlipidemia, unspecified: Secondary | ICD-10-CM | POA: Diagnosis not present

## 2022-05-12 DIAGNOSIS — Z111 Encounter for screening for respiratory tuberculosis: Secondary | ICD-10-CM

## 2022-05-12 DIAGNOSIS — K5901 Slow transit constipation: Secondary | ICD-10-CM | POA: Diagnosis not present

## 2022-05-12 DIAGNOSIS — M419 Scoliosis, unspecified: Secondary | ICD-10-CM | POA: Diagnosis not present

## 2022-05-12 DIAGNOSIS — I10 Essential (primary) hypertension: Secondary | ICD-10-CM | POA: Diagnosis not present

## 2022-05-12 DIAGNOSIS — I700 Atherosclerosis of aorta: Secondary | ICD-10-CM | POA: Diagnosis not present

## 2022-05-12 DIAGNOSIS — G301 Alzheimer's disease with late onset: Secondary | ICD-10-CM | POA: Diagnosis not present

## 2022-07-20 ENCOUNTER — Encounter (HOSPITAL_COMMUNITY): Payer: Self-pay

## 2022-07-20 ENCOUNTER — Emergency Department (HOSPITAL_COMMUNITY): Payer: Medicare Other

## 2022-07-20 ENCOUNTER — Emergency Department (HOSPITAL_COMMUNITY)
Admission: EM | Admit: 2022-07-20 | Discharge: 2022-07-20 | Disposition: A | Discharge status: Home / Self Care | Payer: Medicare Other | Attending: Emergency Medicine | Admitting: Emergency Medicine

## 2022-07-20 DIAGNOSIS — W19XXXA Unspecified fall, initial encounter: Secondary | ICD-10-CM | POA: Diagnosis not present

## 2022-07-20 DIAGNOSIS — Y92000 Kitchen of unspecified non-institutional (private) residence as  the place of occurrence of the external cause: Secondary | ICD-10-CM | POA: Diagnosis not present

## 2022-07-20 DIAGNOSIS — M47816 Spondylosis without myelopathy or radiculopathy, lumbar region: Secondary | ICD-10-CM | POA: Diagnosis not present

## 2022-07-20 DIAGNOSIS — Z79899 Other long term (current) drug therapy: Secondary | ICD-10-CM | POA: Diagnosis not present

## 2022-07-20 DIAGNOSIS — M419 Scoliosis, unspecified: Secondary | ICD-10-CM | POA: Diagnosis not present

## 2022-07-20 DIAGNOSIS — S3992XA Unspecified injury of lower back, initial encounter: Secondary | ICD-10-CM | POA: Diagnosis not present

## 2022-07-20 DIAGNOSIS — I10 Essential (primary) hypertension: Secondary | ICD-10-CM | POA: Diagnosis not present

## 2022-07-20 DIAGNOSIS — F0390 Unspecified dementia without behavioral disturbance: Secondary | ICD-10-CM | POA: Diagnosis not present

## 2022-07-20 DIAGNOSIS — Z743 Need for continuous supervision: Secondary | ICD-10-CM | POA: Diagnosis not present

## 2022-07-20 DIAGNOSIS — M545 Low back pain, unspecified: Secondary | ICD-10-CM | POA: Diagnosis not present

## 2022-07-20 DIAGNOSIS — M5489 Other dorsalgia: Secondary | ICD-10-CM | POA: Diagnosis not present

## 2022-07-20 MED ORDER — ACETAMINOPHEN 325 MG PO TABS
650.0000 mg | ORAL_TABLET | Freq: Once | ORAL | Status: AC
Start: 2022-07-20 — End: 2022-07-20
  Administered 2022-07-20: 650 mg via ORAL
  Filled 2022-07-20: qty 2

## 2022-07-20 NOTE — ED Triage Notes (Signed)
Pt arrived via POV, mechanical fall, no thinners, no LOC. C/o pain in tailbone.

## 2022-07-20 NOTE — Discharge Instructions (Signed)
The x-rays did not show any signs of fracture.  Take Tylenol as needed for pain.  Follow-up with your doctor to be rechecked if the symptoms do not resolve over the next week

## 2022-07-20 NOTE — ED Provider Notes (Signed)
[facility] DEPT Provider Note   CSN: [id number] Arrival date & time: 07/20/22  1656     History  Chief Complaint  Patient presents with   Alicia [name] is a 86 y.o. female.   Fall   Patient presents to the ED for evaluation after fall.  Patient has history history of some memory issues due to dementia.  She also has history of hypertension.  Patient's husband is here and he was able to provide some additional history.  Husband states that he was standing in the kitchen and started to lose his balance.  His wife tried to help catch him.  He ended up falling backwards landing on her buttocks.  There was no loss of consciousness.  She did not hit her head and neck.  They both were able to get up and get onto the couch.  Patient has been complaining some pain in her lower back.  They called EMS because of how hard they fell.  She has been able to stand and walk.    Home Medications Prior to Admission medications   Medication Sig Start Date End Date Taking? Authorizing Provider  Cholecalciferol (VITAMIN D3) 50 MCG (2000 UT) TABS Take 2,000 Units by mouth daily.    [provider]  donepezil (ARICEPT) 10 MG tablet Take 1 tablet (10 mg total) by mouth at bedtime. 03/02/22   Suzzanne Cloud, NP  ibuprofen (ADVIL) 200 MG tablet Take 400 mg by mouth every 8 (eight) hours as needed (for pain.).    [provider]  memantine (NAMENDA) 10 MG tablet Take 1 tablet (10 mg total) by mouth 2 (two) times daily. 03/02/22   Suzzanne Cloud, NP  polyethylene glycol (MIRALAX / GLYCOLAX) 17 g packet daily. 1 packet mixed with 8 ounces of fluid    [provider]  rosuvastatin (CRESTOR) 5 MG tablet Take 5 mg by mouth at bedtime. 02/01/20   [provider]      Allergies    Contrast media [iodinated contrast media]    Review of Systems   Review of Systems  Physical Exam Updated Vital Signs BP (!) 152/92 (BP Location: Left Arm)    Pulse (!) 107   Temp 98.1 F (36.7 C) (Oral)   Resp 18   SpO2 94%  Physical Exam Vitals and nursing note reviewed.  Constitutional:      Appearance: She is well-developed. She is not diaphoretic.  HENT:     Head: Normocephalic and atraumatic.     Right Ear: External ear normal.     Left Ear: External ear normal.  Eyes:     General: No scleral icterus.       Right eye: No discharge.        Left eye: No discharge.     Conjunctiva/sclera: Conjunctivae normal.  Neck:     Trachea: No tracheal deviation.  Cardiovascular:     Rate and Rhythm: Normal rate and regular rhythm.  Pulmonary:     Effort: Pulmonary effort is normal. No respiratory distress.     Breath sounds: Normal breath sounds. No stridor.  Abdominal:     General: There is no distension.  Musculoskeletal:        General: No swelling or deformity.     Cervical back: Normal and neck supple.     Thoracic back: Normal.     Lumbar back: Bony tenderness present.     Right hip: Normal.  Left hip: Normal.     Comments: No deformity or tenderness bilateral upper extremities or lower extremities  Skin:    General: Skin is warm and dry.     Findings: No rash.  Neurological:     Mental Status: She is alert.     Cranial Nerves: Cranial nerve deficit: no gross deficits.     ED Results / Procedures / Treatments   Labs (all labs ordered are listed, but only abnormal results are displayed) Labs Reviewed - No data to display  EKG None  Radiology DG Lumbar Spine Complete  Result Date: 07/20/2022 CLINICAL DATA:  Trauma, fall, back pain EXAM: LUMBAR SPINE - COMPLETE 4+ VIEW COMPARISON:  03/26/2020 FINDINGS: No recent fracture is seen. There is first-degree anterolisthesis at L4-L5 level. There is mild levoscoliosis. Degenerative changes are noted with bony spurs and facet hypertrophy, more so in the lower lumbar spine. There is disc space narrowing at L5-S1 level. IMPRESSION: No recent fracture is seen.  Lumbar spondylosis.  Electronically Signed   By: Elmer Picker M.D.   On: 07/20/2022 18:15   DG Pelvis 1-2 Views  Result Date: 07/20/2022 CLINICAL DATA:  Fall EXAM: PELVIS - 1-2 VIEW COMPARISON:  CT abdomen pelvis 01/25/2022 FINDINGS: Negative for hip or pelvic fracture. Mild degenerative change in both hips Sclerotic intramedullary lesion proximal left femur unchanged with a benign appearance. Possible bone infarct or enchondroma. Lumbar scoliosis and degenerative change. Prior hernia repair on the right. IMPRESSION: Negative for pelvic fracture. Electronically Signed   By: Franchot Gallo M.D.   On: 07/20/2022 18:15    Procedures Procedures    Medications Ordered in ED Medications  acetaminophen (TYLENOL) tablet 650 mg (650 mg Oral Given 07/20/22 1819)    ED Course/ Medical Decision Making/ A&P Clinical Course as of 07/20/22 1830  Wed Jul 20, 2022  1828 X-rays without acute fracture [JK]    Clinical Course User Index [JK] Dorie Rank, MD                           Medical Decision Making Problems Addressed: Fall, initial encounter: acute illness or injury Injury of low back, initial encounter: acute illness or injury  Amount and/or Complexity of Data Reviewed Radiology: ordered and independent interpretation performed.  Risk OTC drugs.   Patient had a mechanical fall at home.  She has been complaining of low back discomfort.  No head injury or loss of consciousness.  The patient was able to ambulate after the fall.  X-rays do not show any significant injury.  Evaluation and diagnostic testing in the emergency department does not suggest an emergent condition requiring admission or immediate intervention beyond what has been performed at this time.  The patient is safe for discharge and has been instructed to return immediately for worsening symptoms, change in symptoms or any other concerns.         Final Clinical Impression(s) / ED Diagnoses Final diagnoses:  Fall, initial encounter   Injury of low back, initial encounter  Dementia, unspecified dementia severity, unspecified dementia type, unspecified whether behavioral, psychotic, or mood disturbance or anxiety [facility])    Rx / DC Orders ED Discharge Orders     None         Dorie Rank, MD 07/20/22 [phone]

## 2022-07-28 DIAGNOSIS — E782 Mixed hyperlipidemia: Secondary | ICD-10-CM | POA: Diagnosis not present

## 2022-07-28 DIAGNOSIS — I10 Essential (primary) hypertension: Secondary | ICD-10-CM | POA: Diagnosis not present

## 2022-07-28 DIAGNOSIS — G301 Alzheimer's disease with late onset: Secondary | ICD-10-CM | POA: Diagnosis not present

## 2022-07-29 ENCOUNTER — Telehealth: Payer: Self-pay | Admitting: Neurology

## 2022-07-29 NOTE — Telephone Encounter (Signed)
[facility] Verline Lema) calling for Dr. Fara Olden.  Currently pt taking donepezil (ARICEPT) 10 MG tablet and pt cannot tolerate. Want to see if pt can go 5 mg. Pt's husband has started cutting tablet in half and she is doing much better. If neurologist approves, sent new prescription to the pharmacy

## 2022-08-02 MED ORDER — DONEPEZIL HCL 5 MG PO TABS: 5.0000 mg | ORAL_TABLET | Freq: Every day | ORAL | 0 refills | Status: DC

## 2022-08-02 NOTE — Telephone Encounter (Signed)
I called pt. No answer, left a message asking pt to call me back.   

## 2022-08-02 NOTE — Telephone Encounter (Signed)
Spoke with Judson Roch verbally on this she was ok with me sending in the 5 mg of Donepezil.   [facility].

## 2022-08-02 NOTE — Addendum Note (Signed)
Addended by: Verlin Grills on: 08/02/2022 03:12 PM   Modules accepted: Orders

## 2022-08-02 NOTE — Telephone Encounter (Signed)
Pt's husband returned my call. He confirmed the pt is having trouble tolerating the 10 mg tablets and he has been cutting the tablets in 1/2.  Pt expressed worsening dizziness while on the 10 mg but is a non issue while on the 5 mg. Will confirm with Judson Roch but should be ok to send rx in for 5 mg tablets one per day.     Pt prefers rx to be sent to [facility].

## 2022-08-07 ENCOUNTER — Emergency Department (HOSPITAL_COMMUNITY): Payer: Medicare Other

## 2022-08-07 ENCOUNTER — Other Ambulatory Visit: Payer: Self-pay

## 2022-08-07 ENCOUNTER — Emergency Department (HOSPITAL_COMMUNITY)
Admission: EM | Admit: 2022-08-07 | Discharge: 2022-08-07 | Disposition: A | Discharge status: Home / Self Care | Payer: Medicare Other | Attending: Emergency Medicine | Admitting: Emergency Medicine

## 2022-08-07 DIAGNOSIS — F0390 Unspecified dementia without behavioral disturbance: Secondary | ICD-10-CM | POA: Insufficient documentation

## 2022-08-07 DIAGNOSIS — R404 Transient alteration of awareness: Secondary | ICD-10-CM | POA: Diagnosis not present

## 2022-08-07 DIAGNOSIS — R61 Generalized hyperhidrosis: Secondary | ICD-10-CM | POA: Diagnosis not present

## 2022-08-07 DIAGNOSIS — R55 Syncope and collapse: Secondary | ICD-10-CM | POA: Diagnosis not present

## 2022-08-07 DIAGNOSIS — Z743 Need for continuous supervision: Secondary | ICD-10-CM | POA: Diagnosis not present

## 2022-08-07 DIAGNOSIS — R001 Bradycardia, unspecified: Secondary | ICD-10-CM | POA: Diagnosis not present

## 2022-08-07 DIAGNOSIS — I499 Cardiac arrhythmia, unspecified: Secondary | ICD-10-CM | POA: Diagnosis not present

## 2022-08-07 DIAGNOSIS — R6889 Other general symptoms and signs: Secondary | ICD-10-CM | POA: Diagnosis not present

## 2022-08-07 LAB — CBG MONITORING, ED: Glucose-Capillary: 97 mg/dL (ref 70–99)

## 2022-08-07 LAB — CBC WITH DIFFERENTIAL/PLATELET
Abs Immature Granulocytes: 0.03 10*3/uL (ref 0.00–0.07)
Basophils Absolute: 0 10*3/uL (ref 0.0–0.1)
Basophils Relative: 1 %
Eosinophils Absolute: 0.1 10*3/uL (ref 0.0–0.5)
Eosinophils Relative: 1 %
HCT: 41.1 % (ref 36.0–46.0)
Hemoglobin: 13.7 g/dL (ref 12.0–15.0)
Immature Granulocytes: 0 %
Lymphocytes Relative: 8 %
Lymphs Abs: 0.6 10*3/uL — ABNORMAL LOW (ref 0.7–4.0)
MCH: 32.2 pg (ref 26.0–34.0)
MCHC: 33.3 g/dL (ref 30.0–36.0)
MCV: 96.5 fL (ref 80.0–100.0)
Monocytes Absolute: 0.3 10*3/uL (ref 0.1–1.0)
Monocytes Relative: 5 %
Neutro Abs: 6.4 10*3/uL (ref 1.7–7.7)
Neutrophils Relative %: 85 %
Platelets: 211 10*3/uL (ref 150–400)
RBC: 4.26 MIL/uL (ref 3.87–5.11)
RDW: 14.7 % (ref 11.5–15.5)
WBC: 7.5 10*3/uL (ref 4.0–10.5)
nRBC: 0 % (ref 0.0–0.2)

## 2022-08-07 LAB — URINALYSIS, ROUTINE W REFLEX MICROSCOPIC
Bilirubin Urine: NEGATIVE
Glucose, UA: NEGATIVE mg/dL
Hgb urine dipstick: NEGATIVE
Ketones, ur: 5 mg/dL — AB
Leukocytes,Ua: NEGATIVE
Nitrite: NEGATIVE
Protein, ur: NEGATIVE mg/dL
Specific Gravity, Urine: 1.013 (ref 1.005–1.030)
pH: 6 (ref 5.0–8.0)

## 2022-08-07 LAB — COMPREHENSIVE METABOLIC PANEL
ALT: 13 U/L (ref 0–44)
AST: 19 U/L (ref 15–41)
Albumin: 3.9 g/dL (ref 3.5–5.0)
Alkaline Phosphatase: 50 U/L (ref 38–126)
Anion gap: 9 (ref 5–15)
BUN: 13 mg/dL (ref 8–23)
CO2: 27 mmol/L (ref 22–32)
Calcium: 9.8 mg/dL (ref 8.9–10.3)
Chloride: 104 mmol/L (ref 98–111)
Creatinine, Ser: 0.75 mg/dL (ref 0.44–1.00)
GFR, Estimated: >60 mL/min (ref >60)
Glucose, Bld: 91 mg/dL (ref 70–99)
Potassium: 3.7 mmol/L (ref 3.5–5.1)
Sodium: 140 mmol/L (ref 135–145)
Total Bilirubin: 1.9 mg/dL — ABNORMAL HIGH (ref 0.3–1.2)
Total Protein: 6.7 g/dL (ref 6.5–8.1)

## 2022-08-07 LAB — TROPONIN I (HIGH SENSITIVITY)
Troponin I (High Sensitivity): 6 ng/L (ref <18)
Troponin I (High Sensitivity): 6 ng/L (ref <18)

## 2022-08-07 LAB — CK: Total CK: 76 U/L (ref 38–234)

## 2022-08-07 NOTE — ED Provider Notes (Signed)
[facility] EMERGENCY DEPARTMENT Provider Note   CSN: [id number] Arrival date & time: 08/07/22  1411     History  Chief Complaint  Patient presents with   Near Syncope    LESBIA OTTAWAY is a 86 y.o. female.  Patient is an 86 year old female with a past medical history of dementia presenting to the emergency department with her husband after a syncopal episode.  Patient's husband states that they were eating breakfast together late this morning and that she was acting like her normal self.  He states that after finishing breakfast her head leaned back and she went unresponsive.  He states that he tried to sit her head up without any improvement.  He states that her episode of unresponsiveness lasted about 15 minutes.  He states that once she slowly started to respond to him he helped her get onto the couch and called 911.  History from the patient is limited due to her dementia but she is denying any chest pain or shortness of breath, she does not remember the episode.  She denies feeling lightheaded or dizzy today.  Her husband states that she has otherwise been feeling well and acting like her normal self.  The history is provided by the patient and the spouse. History limited by: dementia.  Near Syncope       Home Medications Prior to Admission medications   Medication Sig Start Date End Date Taking? Authorizing Provider  Cholecalciferol (VITAMIN D3) 50 MCG (2000 UT) TABS Take 2,000 Units by mouth daily.    [provider]  donepezil (ARICEPT) 10 MG tablet Take 1 tablet (10 mg total) by mouth at bedtime. 03/02/22   Suzzanne Cloud, NP  donepezil (ARICEPT) 5 MG tablet Take 1 tablet (5 mg total) by mouth at bedtime. 08/02/22   Suzzanne Cloud, NP  ibuprofen (ADVIL) 200 MG tablet Take 400 mg by mouth every 8 (eight) hours as needed (for pain.).    [provider]  memantine (NAMENDA) 10 MG tablet Take 1 tablet (10 mg total) by mouth 2 (two) times  daily. 03/02/22   Suzzanne Cloud, NP  polyethylene glycol (MIRALAX / GLYCOLAX) 17 g packet daily. 1 packet mixed with 8 ounces of fluid    [provider]  rosuvastatin (CRESTOR) 5 MG tablet Take 5 mg by mouth at bedtime. 02/01/20   [provider]      Allergies    Contrast media [iodinated contrast media]    Review of Systems   Review of Systems  Cardiovascular:  Positive for near-syncope.    Physical Exam Updated Vital Signs SpO2 99%  Physical Exam Vitals and nursing note reviewed.  Constitutional:      General: She is not in acute distress.    Appearance: Normal appearance.  HENT:     Head: Normocephalic and atraumatic.     Nose: Nose normal.     Mouth/Throat:     Mouth: Mucous membranes are moist.     Pharynx: Oropharynx is clear.  Eyes:     Extraocular Movements: Extraocular movements intact.     Conjunctiva/sclera: Conjunctivae normal.  Cardiovascular:     Rate and Rhythm: Regular rhythm. Bradycardia present.     Pulses: Normal pulses.     Heart sounds: Normal heart sounds.  Pulmonary:     Effort: Pulmonary effort is normal.     Breath sounds: Normal breath sounds.  Abdominal:     General: Abdomen is flat.  Palpations: Abdomen is soft.     Tenderness: There is no abdominal tenderness.  Musculoskeletal:        General: Normal range of motion.     Cervical back: Normal range of motion and neck supple.  Skin:    General: Skin is warm and dry.  Neurological:     General: No focal deficit present.     Mental Status: She is alert. Mental status is at baseline.     Sensory: No sensory deficit.     Motor: No weakness.     Comments: Oriented to self  Psychiatric:        Mood and Affect: Mood normal.        Behavior: Behavior normal.     ED Results / Procedures / Treatments   Labs (all labs ordered are listed, but only abnormal results are displayed) Labs Reviewed  COMPREHENSIVE METABOLIC PANEL  CBC WITH DIFFERENTIAL/PLATELET  URINALYSIS,  ROUTINE W REFLEX MICROSCOPIC  CK  CBG MONITORING, ED  TROPONIN I (HIGH SENSITIVITY)    EKG EKG Interpretation  Date/Time:  Sunday August 07 2022 14:31:50 EDT Ventricular Rate:  52 PR Interval:  171 QRS Duration: 91 QT Interval:  502 QTC Calculation: 467 R Axis:   -10 Text Interpretation: Sinus bradycardia Borderline T wave abnormalities No significant change since last tracing Confirmed by Oneal Deputy [id number]) on 08/07/2022 3:19:46 PM  Radiology DG Chest Port 1 View  Result Date: 08/07/2022 CLINICAL DATA:  Syncope EXAM: PORTABLE CHEST 1 VIEW COMPARISON:  05/12/2022 FINDINGS: Transverse diameter of heart is slightly increased. Thoracic aorta is tortuous and ectatic. Lung fields are clear of any infiltrates or pulmonary edema. There is no pleural effusion or pneumothorax. IMPRESSION: No active disease. Electronically Signed   By: Elmer Picker M.D.   On: 08/07/2022 15:08    Procedures Procedures    Medications Ordered in ED Medications - No data to display  ED Course/ Medical Decision Making/ A&P Clinical Course as of 08/07/22 1520  Sun Aug 07, 2022  1520 Patient signed out to Dr. Pearline Cables pending work up. [VK]    Clinical Course User Index [VK] Ottie Glazier, DO                           Medical Decision Making This patient presents to the ED with chief complaint(s) of syncope with pertinent past medical history of dementia which further complicates the presenting complaint. The complaint involves an extensive differential diagnosis and also carries with it a high risk of complications and morbidity.    The differential diagnosis includes ACS, arrhythmia, CVA less likely as patient had no focal neurologic deficits, seizure unlikely as no reported seizure-like activity or postictal period, electrolyte abnormality, infection  Additional history obtained: Additional history obtained from spouse Records reviewed Primary Care Documents  ED Course and  Reassessment: Upon patient's arrival to the emergency department she is awake and alert at her neurologic baseline.  Patient will have EKG and labs performed to evaluate the cause of her syncopal episode.  Due to the patient's prolonged syncopal state, she will likely require admission for further evaluation  Independent labs interpretation:  The following labs were independently interpreted: Pending  Independent visualization of imaging: - Pending     Amount and/or Complexity of Data Reviewed Labs: ordered. Radiology: ordered.          Final Clinical Impression(s) / ED Diagnoses Final diagnoses:  None    Rx / DC Orders  ED Discharge Orders     None         Ottie Glazier, DO 08/07/22 1520

## 2022-08-07 NOTE — ED Provider Notes (Signed)
Provider Note MRN:  [id number]  Arrival date & time: 08/07/22    ED Course and Medical Decision Making  Assumed care from [facility] at shift change.  See note from prior team for complete details, in brief:  86 yo female Hx dementia (alzheimer), orthostatic hypotension, spinal stenosis To ED w/ syncopal episode Pt unable to contribute much to history given her dementia, primariy historian is husband Spouse reports that they just completed eating breakfast, he went to go to a different room and when he returned to the dining room the patient was leaning back in her chair and appeared to be sleeping.  Patient was drooling, eyes rolled back in her head.  Symptoms resolved after 5 to 10 seconds.  Following this the patient appeared to be at her baseline.  No seizure-like activity was reported.  The husband rolled her over to the sofa and laid her down.  She had repeat episode lasting approximately 5 to 10 seconds and resolve spontaneously.  No seizure activity, did not fall to the floor.  No change to her mental status, no incontinence or vomiting.  Patient denies any acute complaints but again is a very poor historian.  Spouse denies similar symptoms in the past.  Of note donepezil was recently adjusted from 10 mg to 5 mg, per husband pt with dizziness/light headedness with the 10 mg dosage.   Plan per prior physician obtain blood work, imaging     CT head was reviewed, no acute process Chest x-ray was within normal limits  CMP with mildly elevated T. bili, otherwise no transaminase elevation or other abnormalities.  CBC with lymphopenia at 0.6 otherwise no acute abnormalities.  Initial troponin was 6.  CK is 76. ECG w/o stemi or arrhythmia that appears acute.   Neurologically the patient has no complaints, difficult to follow neurologic exam given her dementia and difficulty with speech from baseline.  She does have sensation in all 4 extremities, able to follow finger-nose, strength equal all  4 extremities.  No obvious facial droop.  She is AO x1, she does have some apparent aphasia but husband reports this is her baseline speech.  Observation was discussed however pt and family expressed desire to go home and do not want to stay in the [place] them to f/u with cardiology. Ambulatory referral to cardiology placed.   The patient improved significantly and was discharged in stable condition. Detailed discussions were had with the patient regarding current findings, and need for close f/u with PCP or on call doctor. The patient has been instructed to return immediately if the symptoms worsen in any way for re-evaluation. Patient verbalized understanding and is in agreement with current care plan. All questions answered prior to discharge.    Procedures  Final Clinical Impressions(s) / ED Diagnoses     ICD-10-CM   1. Syncope, unspecified syncope type  R55 Ambulatory referral to Cardiology    2. Dementia, unspecified dementia severity, unspecified dementia type, unspecified whether behavioral, psychotic, or mood disturbance or anxiety ([place])  F03.90       ED Discharge Orders          Ordered    Ambulatory referral to Cardiology        08/07/22 1938              Discharge Instructions      It was a pleasure caring for you today in the emergency department.  Please return to the emergency department for any worsening or worrisome symptoms.  Jeanell Sparrow, DO 08/07/22 2339

## 2022-08-07 NOTE — ED Notes (Signed)
Pt attempting to get out of bed and get dressed. Assisted patient back to safe position and explained not yet time to go home. Encouraged husband note to give patient her clothes again from bag on table until time to be discharged.

## 2022-08-07 NOTE — ED Triage Notes (Signed)
Per [facility]. Husband called 54 for a syncopal episode. She is at baseline. Hx dementia. Husband reports that she got sweaty and was out for about 30 seconds. No complaints with EMS enroute

## 2022-08-07 NOTE — Discharge Instructions (Signed)
It was a pleasure caring for you today in the emergency department. ° °Please return to the emergency department for any worsening or worrisome symptoms. ° ° °

## 2022-08-11 ENCOUNTER — Ambulatory Visit: Payer: Medicare Other | Attending: Cardiovascular Disease | Admitting: Cardiovascular Disease

## 2022-08-11 ENCOUNTER — Encounter: Payer: Self-pay | Admitting: Cardiovascular Disease

## 2022-08-11 VITALS — BP 126/62 | HR 60 | Ht 66.0 in | Wt 114.8 lb

## 2022-08-11 DIAGNOSIS — R001 Bradycardia, unspecified: Secondary | ICD-10-CM | POA: Diagnosis not present

## 2022-08-11 NOTE — Progress Notes (Signed)
Cardiology Office Note:    Date:  08/11/2022   ID:  Alicia [name], DOB 09-13-1936, MRN [id number]  PCP:  Alicia Cha, MD   [facility] Providers Cardiologist:  None {    Referring MD: Alicia Sparrow, DO   Chief Complaint  Patient presents with   Loss of Consciousness   Bradycardia     History of Present Illness:    Alicia [name] is a 86 y.o. female with a hx of  dementia, HTN She had an episode of syncope on Dec. 10 Called EMS  Went to the ER  Has occurred one other time in the past  Husband is not sure if she was eating or drinking well during the day  Is steadily losing weight   Symptoms of orthostasis No lab evidence of infection Bmp is wnl  Severe dementia  -  husband does most of the talking  Steady decline of weight ,    Husband does the cooking ,  lots of frozen dinners.  Wt was 139 lbs in Oct. 2020 Wt today is 120   Sept. 14, 2023 Seen with husband. Alicia [name]  Has severe dementia  Patient was found to be passed out at home by her husband. Wt is 115 lbs  ( down 5lbs from Jan. 2023)  Went to the ER,  was sent home from the ER   The syncopal episodes occurred after she had eaten a good breakfast.  Eggs, juice, coffee,   Work-up in the emergency room was unremarkable.  Her EKG at that time revealed sinus bradycardia 52.  No ST or T wave changes. Troponin levels from the emergency room are normal.  Electrolytes were normal.  Hemoglobin is normal. Urinalysis is normal. Head CT did not reveal any acute abnormalities.  She does have small vessel disease likely due to age related issues.  Will place a 30 day monitor       Past Medical History:  Diagnosis Date   Alzheimer disease ([place]) 01/17/2019   Anxiety    Arthritis    Frequent UTI    over the last year   Hypertension    Inguinal hernia    right   Shingles    Urinary frequency    over the year   Urinary urgency    Wears glasses     Past Surgical History:  Procedure  Laterality Date   BUNIONECTOMY     both feet   EYE SURGERY Left 04-2013   cataract Left eye   INGUINAL HERNIA REPAIR Right 09/16/2019   Procedure: LAPAROSCOPIC RIGHT INGUINAL HERNIA REPAIR WITH MESH;  Surgeon: Alicia Ok, MD;  Location: [place];  Service: General;  Laterality: Right;   LUMBAR LAMINECTOMY N/A 10/14/2013   Procedure: MICRODISCECTOMY LUMBAR LAMINECTOMY/L4-5 Decompression, Excision Extradural Intraspinal Cyst;  Surgeon: Alicia Killings, MD;  Location: [place];  Service: Orthopedics;  Laterality: N/A;  L4-5 Decompression, Excision Extradural Intraspinal Cyst    Current Medications: No outpatient medications have been marked as taking for the 08/11/22 encounter (Office Visit) with Alicia [name], Alicia Cheng, MD.     Allergies:   Contrast media [iodinated contrast media]   Social History   Socioeconomic History   Marital status: Married    Spouse name: Not on file   Number of children: Not on file   Years of education: 2   Highest education level: Some college, no degree  Occupational History   Occupation: retired  Tobacco Use   Smoking status: Former  Types: Cigarettes   Smokeless tobacco: Never  Vaping Use   Vaping Use: Never used  Substance and Sexual Activity   Alcohol use: Yes    Alcohol/week: 3.0 standard drinks of alcohol    Types: 3 Glasses of wine per week   Drug use: No   Sexual activity: Not Currently  Other Topics Concern   Not on file  Social History Narrative   Right handed   Lives at home with husband    Moderate caffeine usage    Social Determinants of Health   Financial Resource Strain: Not on file  Food Insecurity: Not on file  Transportation Needs: Not on file  Physical Activity: Not on file  Stress: Not on file  Social Connections: Not on file     Family History: The patient's family history includes Breast cancer in her sister; Diabetes in her sister; High blood pressure in her mother and sister. There is no history of Colon cancer or  Esophageal cancer.  ROS:   Please see the history of present illness.     All other systems reviewed and are negative.  EKGs/Labs/Other Studies Reviewed:    The following studies were reviewed today:   EKG:    Recent Labs: 08/07/2022: ALT 13; BUN 13; Creatinine, Ser 0.75; Hemoglobin 13.7; Platelets 211; Potassium 3.7; Sodium 140  Recent Lipid Panel No results found for: "CHOL", "TRIG", "HDL", "CHOLHDL", "VLDL", "LDLCALC", "LDLDIRECT"   Risk Assessment/Calculations:           Physical Exam:    Physical Exam: Blood pressure 126/62, pulse 60, height 5' 6"  (1.676 m), weight 114 lb 12.8 oz (52.1 kg), SpO2 98 %.     GEN:  Well nourished, well developed in no acute distress HEENT: Normal NECK: No JVD; No carotid bruits LYMPHATICS: No lymphadenopathy CARDIAC: RRR , no murmurs, rubs, gallops RESPIRATORY:  Clear to auscultation without rales, wheezing or rhonchi  ABDOMEN: Soft, non-tender, non-distended MUSCULOSKELETAL:  No edema; No deformity  SKIN: Warm and dry NEUROLOGIC:  Alert and oriented x 3   ASSESSMENT:    1. Sinus bradycardia    PLAN:       Near syncope/syncope: Marland Kitchen  [name] had another episode of syncope while sitting at the table.  Her symptoms at this time did not sound orthostatic.  She has bradycardia at baseline.  We will overall health is gradually and continually declining.  She continues to lose weight.  She eats a good breakfast but really nothing more after breakfast.  We will place an event monitor for further evaluation to look for worsening bradycardia/heart block. If she is found to have heart block/severe bradycardia we will need to consider pacemaker implantation.  We will place a 30-day event monitor.  I will plan on seeing her back in 6 months.  We will see her sooner if she has recurrent episodes of syncope.           Medication Adjustments/Labs and Tests Ordered: Current medicines are reviewed at length with the patient today.   Concerns regarding medicines are outlined above.  Orders Placed This Encounter  Procedures   CARDIAC EVENT MONITOR   No orders of the defined types were placed in this encounter.   Patient Instructions  Medication Instructions:  Your physician recommends that you continue on your current medications as directed. Please refer to the Current Medication list given to you today.  *If you need a refill on your cardiac medications before your next appointment, please call your pharmacy*  Lab Work: NONE If you have labs (blood work) drawn today and your tests are completely normal, you will receive your results only by: [place] (if you have MyChart) OR A paper copy in the mail If you have any lab test that is abnormal or we need to change your treatment, we will call you to review the results.   Testing/Procedures: 30 day event monitor- come back next Wednesday 08/17/22 @ 4:00pm   Follow-Up: At [facility], you and your health needs are our priority.  As part of our continuing mission to provide you with exceptional heart care, we have created designated Provider Care Teams.  These Care Teams include your primary Cardiologist (physician) and Advanced Practice Providers (APPs -  Physician Assistants and Nurse Practitioners) who all work together to provide you with the care you need, when you need it.  Your next appointment:   6 month(s)  The format for your next appointment:   In Person  Provider:   Mertie Moores, MD     Important Information About Sugar         Signed, Mertie Moores, MD  08/11/2022 5:16 PM    [place]

## 2022-08-11 NOTE — Patient Instructions (Addendum)
Medication Instructions:  Your physician recommends that you continue on your current medications as directed. Please refer to the Current Medication list given to you today.  *If you need a refill on your cardiac medications before your next appointment, please call your pharmacy*   Lab Work: NONE If you have labs (blood work) drawn today and your tests are completely normal, you will receive your results only by: [place] (if you have MyChart) OR A paper copy in the mail If you have any lab test that is abnormal or we need to change your treatment, we will call you to review the results.   Testing/Procedures: 30 day event monitor- come back next Wednesday 08/17/22 @ 4:00pm   Follow-Up: At [facility], you and your health needs are our priority.  As part of our continuing mission to provide you with exceptional heart care, we have created designated Provider Care Teams.  These Care Teams include your primary Cardiologist (physician) and Advanced Practice Providers (APPs -  Physician Assistants and Nurse Practitioners) who all work together to provide you with the care you need, when you need it.  Your next appointment:   6 month(s)  The format for your next appointment:   In Person  Provider:   Mertie Moores, MD     Important Information About Sugar

## 2022-08-17 ENCOUNTER — Ambulatory Visit: Payer: Medicare Other | Attending: Cardiovascular Disease

## 2022-08-17 DIAGNOSIS — G301 Alzheimer's disease with late onset: Secondary | ICD-10-CM | POA: Diagnosis not present

## 2022-08-17 DIAGNOSIS — R001 Bradycardia, unspecified: Secondary | ICD-10-CM | POA: Diagnosis not present

## 2022-08-17 DIAGNOSIS — I10 Essential (primary) hypertension: Secondary | ICD-10-CM | POA: Diagnosis not present

## 2022-08-17 DIAGNOSIS — E782 Mixed hyperlipidemia: Secondary | ICD-10-CM | POA: Diagnosis not present

## 2022-08-17 NOTE — Progress Notes (Unsigned)
[employer] serial # [id number] from office inventory applied to patient.  Patients husband Alicia [name] assists.  They may need to come in to have monitor batteries changed.  Told to call ahead of time.

## 2022-08-24 DIAGNOSIS — R4789 Other speech disturbances: Secondary | ICD-10-CM | POA: Diagnosis not present

## 2022-08-24 DIAGNOSIS — R1310 Dysphagia, unspecified: Secondary | ICD-10-CM | POA: Diagnosis not present

## 2022-08-24 DIAGNOSIS — G301 Alzheimer's disease with late onset: Secondary | ICD-10-CM | POA: Diagnosis not present

## 2022-08-24 DIAGNOSIS — Z23 Encounter for immunization: Secondary | ICD-10-CM | POA: Diagnosis not present

## 2022-08-25 ENCOUNTER — Encounter: Payer: Self-pay | Admitting: *Deleted

## 2022-08-25 ENCOUNTER — Telehealth: Payer: Self-pay | Admitting: *Deleted

## 2022-08-25 ENCOUNTER — Ambulatory Visit: Payer: Medicare Other | Attending: Cardiovascular Disease

## 2022-08-25 NOTE — Telephone Encounter (Signed)
Scheduled to come in 08/25/22, 3:00 PM to troubleshoot problem.

## 2022-08-25 NOTE — Telephone Encounter (Signed)
Patient's husband called and would like a return call about patient's heart monitor. Please call back

## 2022-08-25 NOTE — Progress Notes (Signed)
Patient ID: Alicia [name], female   DOB: [birth year], 86 y.o.   MRN: [id number] Mr. Alcalde tried changing monitor batteries and kept getting signal poor skin contact. He removed 3 strips from same area but could not get a reading of good contact.  Monitor battery of other monitor needed to be shut off manually.  Phone was interpreting data from monitor that had been removed from patient.  I removed the patch from Mrs. Slee chest gently.  Her skin was bright red and tender. It was decided to apply her monitor strip in the alternative horizontal position. Gently cleansed flaking skin and dried before applying a new strip and battery.  Turned on monitor and received reading of good skin contact , battery 94% charged.  Let Mr. Poliquin know he can call me next week if he needs help putting on next monitor/strip.

## 2022-09-02 ENCOUNTER — Telehealth: Payer: Self-pay | Admitting: Cardiovascular Disease

## 2022-09-02 NOTE — Telephone Encounter (Signed)
Pt husband calling stating he is having trouble putting pt's monitor and wondered if Darrick Penna could help him

## 2022-09-05 NOTE — Telephone Encounter (Signed)
Pt husband calling back because he still needs help putting pt's monitor on

## 2022-09-06 ENCOUNTER — Telehealth: Payer: Self-pay | Admitting: *Deleted

## 2022-09-06 NOTE — Telephone Encounter (Signed)
Patients husband states he was able to change the strip on his wifes monitor successfully this weekend. He is out of strips and will need more to complete 30 day monitor. Three additional Body Guardian Hydrocolloid strips will be left at front desk.

## 2022-09-07 ENCOUNTER — Other Ambulatory Visit: Payer: Self-pay

## 2022-09-07 ENCOUNTER — Encounter: Payer: Self-pay | Admitting: Speech Pathology

## 2022-09-07 ENCOUNTER — Ambulatory Visit: Payer: Medicare Other | Attending: Internal Medicine | Admitting: Speech Pathology

## 2022-09-07 DIAGNOSIS — R1312 Dysphagia, oropharyngeal phase: Secondary | ICD-10-CM

## 2022-09-07 NOTE — Therapy (Signed)
OUTPATIENT SPEECH LANGUAGE PATHOLOGY SWALLOW EVALUATION   Patient Name: Alicia [name] MRN: [id number] DOB:[birth year], 86 y.o., female Today's Date: 09/07/2022  PCP: Leeroy Cha, MD  REFERRING PROVIDER: Leeroy Cha, MD    End of Session - 09/07/22 1207     Visit Number 1    Number of Visits 1    Date for SLP Re-Evaluation 09/14/22    SLP Start Time 66    SLP Stop Time  1150    SLP Time Calculation (min) 50 min    Activity Tolerance Patient tolerated treatment well             Past Medical History:  Diagnosis Date   Alzheimer disease ([place]) 01/17/2019   Anxiety    Arthritis    Frequent UTI    over the last year   Hypertension    Inguinal hernia    right   Shingles    Urinary frequency    over the year   Urinary urgency    Wears glasses    Past Surgical History:  Procedure Laterality Date   BUNIONECTOMY     both feet   EYE SURGERY Left 04-2013   cataract Left eye   INGUINAL HERNIA REPAIR Right 09/16/2019   Procedure: LAPAROSCOPIC RIGHT INGUINAL HERNIA REPAIR WITH MESH;  Surgeon: Ralene Ok, MD;  Location: [place];  Service: General;  Laterality: Right;   LUMBAR LAMINECTOMY N/A 10/14/2013   Procedure: MICRODISCECTOMY LUMBAR LAMINECTOMY/L4-5 Decompression, Excision Extradural Intraspinal Cyst;  Surgeon: Marybelle Killings, MD;  Location: [place];  Service: Orthopedics;  Laterality: N/A;  L4-5 Decompression, Excision Extradural Intraspinal Cyst   Patient Active Problem List   Diagnosis Date Noted   Syncope and collapse 12/10/2021   Orthostatic hypotension 12/10/2021   Alzheimer disease ([place]) 01/17/2019   Spinal stenosis, lumbar region, with neurogenic claudication 10/14/2013    Class: Diagnosis of    ONSET DATE: 08/25/2022 referral date   REFERRING DIAG: R13.10 (ICD-10-CM) - Dysphagia, unspecified   THERAPY DIAG:  Dysphagia, oropharyngeal phase  Rationale for Evaluation and Treatment Rehabilitation  SUBJECTIVE:   SUBJECTIVE  STATEMENT: "She can't swallow her pills" Pt accompanied by: significant other [name] Laramie  PERTINENT HISTORY: 86 y.o. female with advanced AD. Referred for difficulty swallowing her 4 pills.  PAIN:  Are you having pain? No  FALLS: Has patient fallen in last 6 months?  Yes, due husband falling on her  LIVING ENVIRONMENT: Lives with: lives with their spouse Lives in: Other [facility] independent living  PLOF:  Level of assistance: Needed assistance with ADLs, Needed assistance with IADLS Employment: Retired   PATIENT GOALS To take pills better  OBJECTIVE:     RECOMMENDATIONS FROM OBJECTIVE SWALLOW STUDY (MBSS/FEES):  No prior MBSS  COGNITION: Overall cognitive status: History of cognitive impairments - at baseline Areas of impairment:  Attention: Impaired: Sustained Memory: Impaired: Immediate Working Short term Long term Recruitment consultant Awareness: Impaired: Intellectual Executive function: Impaired: Initiation, Impulse control, Problem solving, Organization, Planning, Error awareness, Self-correction, and Slow processing Behavior: Restless and Impulsive Functional deficits: Verbal expression is empty with lack of content words, jargon. Social greeting intact  ORAL MOTOR EXAMINATION Overall status: Impaired:   Labial: Right (Coordination) Left (Coordination) Lingual: Right (Coordination) Left (Coordination) Cough: Unable to elicit volitional cough Comments:   CLINICAL SWALLOW ASSESSMENT:   Current diet: regular Dentition: adequate natural dentition Patient directly observed with POs: Yes: regular and thin liquids  Feeding: able to feed self Liquids provided by: cup Oral phase signs  and symptoms:  none Pharyngeal phase signs and symptoms:  none     TODAY'S TREATMENT:  Provided strategies to facilitate swallowing meds including crushing in puree and using liquid form of meds or adjusting time of day meds are given   PATIENT  EDUCATION: Education details: See treatment, use labels or photos on cabinets and drawers to let her know what is in them Person educated: Patient and Spouse Education method: Explanation, Demonstration, Verbal cues, and Handouts Education comprehension: verbalized understanding   ASSESSMENT:  CLINICAL IMPRESSION: Patient is a 86 y.o. female who was seen today for pill dysphagia. She has advanced AD and is accompanied by her spouse and caregiver, [name]. He reports she is having difficulty swallowing her pills. She takes 4. He describes that he puts the pill in her mouth and gives her a sip but she is not able to swallow the pill and sometimes the pill ends up in the glass of water. He denies any chewing or swallowing difficulties with meals.They are eating frozen dinners primarily. PO trial today of Dys 3 and thin liquid (cereal bar) revealed timely oral phase, adequate mastication and no overt s/s of aspiration, including with successive liquid sips. Pill dysphagia due to cognition affecting her processing and coordination of manipulating pill to posterior tongue. Sometimes [name] has difficulty getting her to open her mouth to get the pill in, again due to cognitive impairment. She had reduced coordination imitating simple lingual and labial movements on oral mech exam. [name] has had recent success changing to giving her meds in the pm. It takes 2-3 minutes to take 4 pills. Since this is working for now, I recommend they continue meds in pm. I provided strategies of putting pills in puree, crushing pills in puree or taking liquid forms of meds to help efficiency of swallowing medications. At this time, I do not recommend skilled ST or objective swallow study.[name] is aware to let PCP know if [place] starts to have difficulty eating meals, coughing or choking. If ST warranted in the future, [place] may be beneficial to assess pt in her home environment..   OBJECTIVE IMPAIRMENTS include dysphagia. These  impairments are limiting patient from managing medications and safety when swallowing. Factors affecting potential to achieve goals and functional outcome are ability to learn/carryover information and medical prognosis. Patient will benefit from skilled SLP services to address above impairments and improve overall function.  REHAB POTENTIAL: Fair advanced AD      Raghav Verrilli, Annye Rusk, CCC-SLP 09/07/2022, 1:16 PM

## 2022-09-07 NOTE — Patient Instructions (Signed)
   If she it taking the meds in the afternoon is working stay with it   If the taking meds gets hard again, try crushing and putting them in applesauce, pudding, yogurt or something thick  If this doesn't work, try to get a liquid form of the medicines or see if some can be stopped  [facility] program for memory care - they have half days and full days  Get some pantry foods before the winter comes in case of snow

## 2022-09-09 ENCOUNTER — Telehealth: Payer: Self-pay | Admitting: Cardiovascular Disease

## 2022-09-09 ENCOUNTER — Telehealth: Payer: Self-pay | Admitting: *Deleted

## 2022-09-09 NOTE — Telephone Encounter (Signed)
[employer] is calling to report a critical EKG

## 2022-09-09 NOTE — Telephone Encounter (Signed)
Spoke with pt's husband, DPR and advised of alert from monitoring company.  Pt's husband states pt is fine and has had no syncopal episodes.  He was adjusting monitor earlier for pt and may have accidentally activated the alert.   Pt's husband advised to have pt continue to monitor and to call for any questions or concerns.  He thanked Therapist, sports for the call.

## 2022-09-09 NOTE — Telephone Encounter (Signed)
New Message:    Patient's husband called. He would like to talk to someone in the Monitor room please.

## 2022-09-09 NOTE — Telephone Encounter (Signed)
Spoke with Alicia [name] who states they have received a monitor alert, pt triggered that pt passed out, tiredness, fatigue, flutter and skipped beats.  Alicia [name] states strip at the time alert shows artifact. Current rhythm is Sinus with rate of 96. Advised RN will contact pt.

## 2022-09-12 NOTE — Telephone Encounter (Signed)
   Pt's spouse calling back to f/u

## 2022-09-12 NOTE — Telephone Encounter (Signed)
Patient had problems with monitor Friday, Saturday and Sunday.  Husband talked with [employer] and they were able to help him in just a couple minutes.  Alicia [name] last day of service will be 09/15/22.  [employer] offered a 5 day extension, but her skin is so tore up from the adhesive strips.  They will send the monitor back on 09/15/22.

## 2022-09-12 NOTE — Telephone Encounter (Signed)
   Cardiac Monitor Alert  Date of alert:  09/12/2022   Patient Name: Alicia [name]  DOB: September 10, 1936  MRN: [id number]   [place] Cardiologist: Dr. Gavin [facility] DOD: Dr. Estevan Ryder     Monitor Information: Cardiac Event Monitor [[employer]]  Reason:  Bradycardia Unspecified Ordering provider:  Dr. Joaquim Nam   Alert Lead loss / artifact This is the 1st alert for this rhythm.   Date:  09/09/2022 at 304 pm   Provider:  Dr. Domingo Mend.  Pt contacted on 09/09/2022 at 424 pm by [place], [place] Triage. Arrhythmia, symptoms and history reviewed with [name] RN, call made to patient.   Plan:  Pt husband stated DPR advised of alert from monitoring company.  Pt husband stated no syncopal episodes, he was adjusting patient's monitor.    Other: 09/09/2022 at 304 pm alert addressed by Marcell Barlow, [place] triage.  Pt husband was adjusting the monitor, and accidentally sent an alert.  Pt has had no syncopal episodes per [name] RN note.    Monitor report received 1115 am on 09/12/2022, taken to DOD, Dr. Estevan Ryder for review, and made aware this lead loss alert was the husband adjusting the monitor. ( No syncope / episodes occurred. )  MD signed, and no orders given.    Varney Daily, RN  09/12/2022 11:58 AM

## 2022-09-14 ENCOUNTER — Other Ambulatory Visit: Payer: Self-pay | Admitting: Neurology

## 2022-09-15 DIAGNOSIS — I10 Essential (primary) hypertension: Secondary | ICD-10-CM | POA: Diagnosis not present

## 2022-09-15 DIAGNOSIS — G301 Alzheimer's disease with late onset: Secondary | ICD-10-CM | POA: Diagnosis not present

## 2022-09-15 DIAGNOSIS — E782 Mixed hyperlipidemia: Secondary | ICD-10-CM | POA: Diagnosis not present

## 2022-09-15 NOTE — Telephone Encounter (Signed)
Pt is needing a refill on her donepezil (ARICEPT) 5 MG tablet and it's needing to be sent to the [facility].

## 2022-09-19 NOTE — Telephone Encounter (Signed)
Rx sent 

## 2022-09-27 ENCOUNTER — Telehealth: Payer: Self-pay | Admitting: Cardiovascular Disease

## 2022-09-27 NOTE — Telephone Encounter (Signed)
  Pt's spouse calling to get heart monitor result

## 2022-09-28 NOTE — Telephone Encounter (Signed)
Spoke with husband earlier today. See result note for details.

## 2022-10-17 DIAGNOSIS — F02B0 Dementia in other diseases classified elsewhere, moderate, without behavioral disturbance, psychotic disturbance, mood disturbance, and anxiety: Secondary | ICD-10-CM | POA: Diagnosis not present

## 2022-10-17 DIAGNOSIS — K5900 Constipation, unspecified: Secondary | ICD-10-CM | POA: Diagnosis not present

## 2022-10-17 DIAGNOSIS — E785 Hyperlipidemia, unspecified: Secondary | ICD-10-CM | POA: Diagnosis not present

## 2022-10-17 DIAGNOSIS — Z79899 Other long term (current) drug therapy: Secondary | ICD-10-CM | POA: Diagnosis not present

## 2022-10-17 DIAGNOSIS — I10 Essential (primary) hypertension: Secondary | ICD-10-CM | POA: Diagnosis not present

## 2022-10-17 DIAGNOSIS — R1312 Dysphagia, oropharyngeal phase: Secondary | ICD-10-CM | POA: Diagnosis not present

## 2022-10-18 DIAGNOSIS — M6281 Muscle weakness (generalized): Secondary | ICD-10-CM | POA: Diagnosis not present

## 2022-10-18 DIAGNOSIS — R278 Other lack of coordination: Secondary | ICD-10-CM | POA: Diagnosis not present

## 2022-10-21 DIAGNOSIS — G301 Alzheimer's disease with late onset: Secondary | ICD-10-CM | POA: Diagnosis not present

## 2022-10-21 DIAGNOSIS — F02B0 Dementia in other diseases classified elsewhere, moderate, without behavioral disturbance, psychotic disturbance, mood disturbance, and anxiety: Secondary | ICD-10-CM | POA: Diagnosis not present

## 2022-10-24 DIAGNOSIS — M6281 Muscle weakness (generalized): Secondary | ICD-10-CM | POA: Diagnosis not present

## 2022-10-24 DIAGNOSIS — R278 Other lack of coordination: Secondary | ICD-10-CM | POA: Diagnosis not present

## 2022-10-26 DIAGNOSIS — M6281 Muscle weakness (generalized): Secondary | ICD-10-CM | POA: Diagnosis not present

## 2022-10-26 DIAGNOSIS — R278 Other lack of coordination: Secondary | ICD-10-CM | POA: Diagnosis not present

## 2022-10-26 DIAGNOSIS — R1312 Dysphagia, oropharyngeal phase: Secondary | ICD-10-CM | POA: Diagnosis not present

## 2022-10-27 DIAGNOSIS — R1312 Dysphagia, oropharyngeal phase: Secondary | ICD-10-CM | POA: Diagnosis not present

## 2022-10-28 DIAGNOSIS — R278 Other lack of coordination: Secondary | ICD-10-CM | POA: Diagnosis not present

## 2022-10-28 DIAGNOSIS — R1312 Dysphagia, oropharyngeal phase: Secondary | ICD-10-CM | POA: Diagnosis not present

## 2022-10-28 DIAGNOSIS — M6281 Muscle weakness (generalized): Secondary | ICD-10-CM | POA: Diagnosis not present

## 2022-10-31 DIAGNOSIS — Z79899 Other long term (current) drug therapy: Secondary | ICD-10-CM | POA: Diagnosis not present

## 2022-10-31 DIAGNOSIS — R1312 Dysphagia, oropharyngeal phase: Secondary | ICD-10-CM | POA: Diagnosis not present

## 2022-10-31 DIAGNOSIS — R278 Other lack of coordination: Secondary | ICD-10-CM | POA: Diagnosis not present

## 2022-10-31 DIAGNOSIS — M6281 Muscle weakness (generalized): Secondary | ICD-10-CM | POA: Diagnosis not present

## 2022-10-31 DIAGNOSIS — I10 Essential (primary) hypertension: Secondary | ICD-10-CM | POA: Diagnosis not present

## 2022-10-31 DIAGNOSIS — F03C3 Unspecified dementia, severe, with mood disturbance: Secondary | ICD-10-CM | POA: Diagnosis not present

## 2022-11-02 DIAGNOSIS — M6281 Muscle weakness (generalized): Secondary | ICD-10-CM | POA: Diagnosis not present

## 2022-11-02 DIAGNOSIS — R1312 Dysphagia, oropharyngeal phase: Secondary | ICD-10-CM | POA: Diagnosis not present

## 2022-11-02 DIAGNOSIS — R278 Other lack of coordination: Secondary | ICD-10-CM | POA: Diagnosis not present

## 2022-11-07 DIAGNOSIS — U071 COVID-19: Secondary | ICD-10-CM | POA: Diagnosis not present

## 2022-11-14 DIAGNOSIS — W19XXXA Unspecified fall, initial encounter: Secondary | ICD-10-CM | POA: Diagnosis not present

## 2022-11-14 DIAGNOSIS — G301 Alzheimer's disease with late onset: Secondary | ICD-10-CM | POA: Diagnosis not present

## 2022-11-14 DIAGNOSIS — M6281 Muscle weakness (generalized): Secondary | ICD-10-CM | POA: Diagnosis not present

## 2022-11-14 DIAGNOSIS — E46 Unspecified protein-calorie malnutrition: Secondary | ICD-10-CM | POA: Diagnosis not present

## 2022-11-14 DIAGNOSIS — R1312 Dysphagia, oropharyngeal phase: Secondary | ICD-10-CM | POA: Diagnosis not present

## 2022-11-14 DIAGNOSIS — Z8616 Personal history of COVID-19: Secondary | ICD-10-CM | POA: Diagnosis not present

## 2022-11-14 DIAGNOSIS — E782 Mixed hyperlipidemia: Secondary | ICD-10-CM | POA: Diagnosis not present

## 2022-11-14 DIAGNOSIS — Z79899 Other long term (current) drug therapy: Secondary | ICD-10-CM | POA: Diagnosis not present

## 2022-11-14 DIAGNOSIS — R278 Other lack of coordination: Secondary | ICD-10-CM | POA: Diagnosis not present

## 2022-11-14 DIAGNOSIS — Z0000 Encounter for general adult medical examination without abnormal findings: Secondary | ICD-10-CM | POA: Diagnosis not present

## 2022-11-14 DIAGNOSIS — I10 Essential (primary) hypertension: Secondary | ICD-10-CM | POA: Diagnosis not present

## 2022-11-14 DIAGNOSIS — F03C3 Unspecified dementia, severe, with mood disturbance: Secondary | ICD-10-CM | POA: Diagnosis not present

## 2022-11-16 DIAGNOSIS — E782 Mixed hyperlipidemia: Secondary | ICD-10-CM | POA: Diagnosis not present

## 2022-11-23 DIAGNOSIS — R278 Other lack of coordination: Secondary | ICD-10-CM | POA: Diagnosis not present

## 2022-11-23 DIAGNOSIS — K5904 Chronic idiopathic constipation: Secondary | ICD-10-CM | POA: Diagnosis not present

## 2022-11-23 DIAGNOSIS — M6281 Muscle weakness (generalized): Secondary | ICD-10-CM | POA: Diagnosis not present

## 2022-11-23 DIAGNOSIS — G301 Alzheimer's disease with late onset: Secondary | ICD-10-CM | POA: Diagnosis not present

## 2022-11-23 DIAGNOSIS — E782 Mixed hyperlipidemia: Secondary | ICD-10-CM | POA: Diagnosis not present

## 2022-11-23 DIAGNOSIS — I10 Essential (primary) hypertension: Secondary | ICD-10-CM | POA: Diagnosis not present

## 2022-11-25 DIAGNOSIS — M6281 Muscle weakness (generalized): Secondary | ICD-10-CM | POA: Diagnosis not present

## 2022-11-25 DIAGNOSIS — R278 Other lack of coordination: Secondary | ICD-10-CM | POA: Diagnosis not present

## 2022-12-01 DIAGNOSIS — R1312 Dysphagia, oropharyngeal phase: Secondary | ICD-10-CM | POA: Diagnosis not present

## 2022-12-02 DIAGNOSIS — R278 Other lack of coordination: Secondary | ICD-10-CM | POA: Diagnosis not present

## 2022-12-02 DIAGNOSIS — R1312 Dysphagia, oropharyngeal phase: Secondary | ICD-10-CM | POA: Diagnosis not present

## 2022-12-02 DIAGNOSIS — M6281 Muscle weakness (generalized): Secondary | ICD-10-CM | POA: Diagnosis not present

## 2022-12-08 DIAGNOSIS — M6281 Muscle weakness (generalized): Secondary | ICD-10-CM | POA: Diagnosis not present

## 2022-12-08 DIAGNOSIS — R278 Other lack of coordination: Secondary | ICD-10-CM | POA: Diagnosis not present

## 2022-12-12 DIAGNOSIS — R278 Other lack of coordination: Secondary | ICD-10-CM | POA: Diagnosis not present

## 2022-12-12 DIAGNOSIS — M6281 Muscle weakness (generalized): Secondary | ICD-10-CM | POA: Diagnosis not present

## 2022-12-19 DIAGNOSIS — I10 Essential (primary) hypertension: Secondary | ICD-10-CM | POA: Diagnosis not present

## 2022-12-19 DIAGNOSIS — E782 Mixed hyperlipidemia: Secondary | ICD-10-CM | POA: Diagnosis not present

## 2022-12-30 DIAGNOSIS — G301 Alzheimer's disease with late onset: Secondary | ICD-10-CM | POA: Diagnosis not present

## 2023-01-05 DIAGNOSIS — R82998 Other abnormal findings in urine: Secondary | ICD-10-CM | POA: Diagnosis not present

## 2023-01-09 DIAGNOSIS — I10 Essential (primary) hypertension: Secondary | ICD-10-CM | POA: Diagnosis not present

## 2023-01-09 DIAGNOSIS — M48061 Spinal stenosis, lumbar region without neurogenic claudication: Secondary | ICD-10-CM | POA: Diagnosis not present

## 2023-01-09 DIAGNOSIS — M13869 Other specified arthritis, unspecified knee: Secondary | ICD-10-CM | POA: Diagnosis not present

## 2023-01-13 DIAGNOSIS — S7001XA Contusion of right hip, initial encounter: Secondary | ICD-10-CM | POA: Diagnosis not present

## 2023-01-13 DIAGNOSIS — S0510XA Contusion of eyeball and orbital tissues, unspecified eye, initial encounter: Secondary | ICD-10-CM | POA: Diagnosis not present

## 2023-01-13 DIAGNOSIS — S0083XA Contusion of other part of head, initial encounter: Secondary | ICD-10-CM | POA: Diagnosis not present

## 2023-01-14 DIAGNOSIS — E782 Mixed hyperlipidemia: Secondary | ICD-10-CM | POA: Diagnosis not present

## 2023-02-02 DIAGNOSIS — L84 Corns and callosities: Secondary | ICD-10-CM | POA: Diagnosis not present

## 2023-02-02 DIAGNOSIS — B351 Tinea unguium: Secondary | ICD-10-CM | POA: Diagnosis not present

## 2023-02-02 DIAGNOSIS — I739 Peripheral vascular disease, unspecified: Secondary | ICD-10-CM | POA: Diagnosis not present

## 2023-02-02 DIAGNOSIS — M2011 Hallux valgus (acquired), right foot: Secondary | ICD-10-CM | POA: Diagnosis not present

## 2023-02-02 DIAGNOSIS — M79675 Pain in left toe(s): Secondary | ICD-10-CM | POA: Diagnosis not present

## 2023-02-06 DIAGNOSIS — I10 Essential (primary) hypertension: Secondary | ICD-10-CM | POA: Diagnosis not present

## 2023-02-06 DIAGNOSIS — M13869 Other specified arthritis, unspecified knee: Secondary | ICD-10-CM | POA: Diagnosis not present

## 2023-02-12 ENCOUNTER — Encounter: Payer: Self-pay | Admitting: Cardiovascular Disease

## 2023-02-12 NOTE — Progress Notes (Signed)
This encounter was created in error - please disregard.

## 2023-02-13 ENCOUNTER — Ambulatory Visit: Payer: Medicare Other | Attending: Cardiovascular Disease | Admitting: Cardiovascular Disease

## 2023-02-15 DIAGNOSIS — E782 Mixed hyperlipidemia: Secondary | ICD-10-CM | POA: Diagnosis not present

## 2023-02-15 DIAGNOSIS — I10 Essential (primary) hypertension: Secondary | ICD-10-CM | POA: Diagnosis not present

## 2023-02-15 DIAGNOSIS — F02818 Dementia in other diseases classified elsewhere, unspecified severity, with other behavioral disturbance: Secondary | ICD-10-CM | POA: Diagnosis not present

## 2023-02-15 DIAGNOSIS — K5901 Slow transit constipation: Secondary | ICD-10-CM | POA: Diagnosis not present

## 2023-02-15 DIAGNOSIS — G301 Alzheimer's disease with late onset: Secondary | ICD-10-CM | POA: Diagnosis not present

## 2023-02-21 DIAGNOSIS — G309 Alzheimer's disease, unspecified: Secondary | ICD-10-CM | POA: Diagnosis not present

## 2023-02-23 DIAGNOSIS — F02B0 Dementia in other diseases classified elsewhere, moderate, without behavioral disturbance, psychotic disturbance, mood disturbance, and anxiety: Secondary | ICD-10-CM | POA: Diagnosis not present

## 2023-02-23 DIAGNOSIS — G301 Alzheimer's disease with late onset: Secondary | ICD-10-CM | POA: Diagnosis not present

## 2023-03-03 DIAGNOSIS — F32A Depression, unspecified: Secondary | ICD-10-CM | POA: Diagnosis not present

## 2023-03-03 DIAGNOSIS — G309 Alzheimer's disease, unspecified: Secondary | ICD-10-CM | POA: Diagnosis not present

## 2023-03-06 DIAGNOSIS — F02B0 Dementia in other diseases classified elsewhere, moderate, without behavioral disturbance, psychotic disturbance, mood disturbance, and anxiety: Secondary | ICD-10-CM | POA: Diagnosis not present

## 2023-03-06 DIAGNOSIS — I10 Essential (primary) hypertension: Secondary | ICD-10-CM | POA: Diagnosis not present

## 2023-03-31 DIAGNOSIS — G309 Alzheimer's disease, unspecified: Secondary | ICD-10-CM | POA: Diagnosis not present

## 2023-04-03 DIAGNOSIS — K5901 Slow transit constipation: Secondary | ICD-10-CM | POA: Diagnosis not present

## 2023-04-03 DIAGNOSIS — M48061 Spinal stenosis, lumbar region without neurogenic claudication: Secondary | ICD-10-CM | POA: Diagnosis not present

## 2023-04-03 DIAGNOSIS — I10 Essential (primary) hypertension: Secondary | ICD-10-CM | POA: Diagnosis not present

## 2023-04-14 ENCOUNTER — Emergency Department (HOSPITAL_COMMUNITY): Payer: Medicare Other

## 2023-04-14 ENCOUNTER — Encounter (HOSPITAL_COMMUNITY): Payer: Self-pay

## 2023-04-14 ENCOUNTER — Other Ambulatory Visit: Payer: Self-pay

## 2023-04-14 ENCOUNTER — Emergency Department (HOSPITAL_COMMUNITY)
Admission: EM | Admit: 2023-04-14 | Discharge: 2023-04-14 | Disposition: A | Discharge status: Home / Self Care | Payer: Medicare Other | Source: Home / Self Care | Attending: Emergency Medicine | Admitting: Emergency Medicine

## 2023-04-14 DIAGNOSIS — S72009A Fracture of unspecified part of neck of unspecified femur, initial encounter for closed fracture: Secondary | ICD-10-CM | POA: Diagnosis present

## 2023-04-14 DIAGNOSIS — Z9842 Cataract extraction status, left eye: Secondary | ICD-10-CM | POA: Diagnosis not present

## 2023-04-14 DIAGNOSIS — W19XXXA Unspecified fall, initial encounter: Secondary | ICD-10-CM | POA: Insufficient documentation

## 2023-04-14 DIAGNOSIS — M6281 Muscle weakness (generalized): Secondary | ICD-10-CM | POA: Diagnosis not present

## 2023-04-14 DIAGNOSIS — M1712 Unilateral primary osteoarthritis, left knee: Secondary | ICD-10-CM | POA: Diagnosis not present

## 2023-04-14 DIAGNOSIS — M47812 Spondylosis without myelopathy or radiculopathy, cervical region: Secondary | ICD-10-CM | POA: Diagnosis not present

## 2023-04-14 DIAGNOSIS — F0390 Unspecified dementia without behavioral disturbance: Secondary | ICD-10-CM | POA: Insufficient documentation

## 2023-04-14 DIAGNOSIS — Z4789 Encounter for other orthopedic aftercare: Secondary | ICD-10-CM | POA: Diagnosis not present

## 2023-04-14 DIAGNOSIS — Z79899 Other long term (current) drug therapy: Secondary | ICD-10-CM | POA: Diagnosis not present

## 2023-04-14 DIAGNOSIS — I10 Essential (primary) hypertension: Secondary | ICD-10-CM | POA: Diagnosis not present

## 2023-04-14 DIAGNOSIS — Z96642 Presence of left artificial hip joint: Secondary | ICD-10-CM | POA: Diagnosis not present

## 2023-04-14 DIAGNOSIS — R531 Weakness: Secondary | ICD-10-CM | POA: Diagnosis not present

## 2023-04-14 DIAGNOSIS — Z743 Need for continuous supervision: Secondary | ICD-10-CM | POA: Diagnosis not present

## 2023-04-14 DIAGNOSIS — Z66 Do not resuscitate: Secondary | ICD-10-CM | POA: Diagnosis not present

## 2023-04-14 DIAGNOSIS — F0280 Dementia in other diseases classified elsewhere without behavioral disturbance: Secondary | ICD-10-CM | POA: Diagnosis not present

## 2023-04-14 DIAGNOSIS — M8588 Other specified disorders of bone density and structure, other site: Secondary | ICD-10-CM | POA: Diagnosis not present

## 2023-04-14 DIAGNOSIS — E440 Moderate protein-calorie malnutrition: Secondary | ICD-10-CM | POA: Diagnosis not present

## 2023-04-14 DIAGNOSIS — W010XXA Fall on same level from slipping, tripping and stumbling without subsequent striking against object, initial encounter: Secondary | ICD-10-CM | POA: Diagnosis not present

## 2023-04-14 DIAGNOSIS — Z751 Person awaiting admission to adequate facility elsewhere: Secondary | ICD-10-CM | POA: Diagnosis not present

## 2023-04-14 DIAGNOSIS — E785 Hyperlipidemia, unspecified: Secondary | ICD-10-CM | POA: Diagnosis not present

## 2023-04-14 DIAGNOSIS — Z87891 Personal history of nicotine dependence: Secondary | ICD-10-CM | POA: Diagnosis not present

## 2023-04-14 DIAGNOSIS — S72002P Fracture of unspecified part of neck of left femur, subsequent encounter for closed fracture with malunion: Secondary | ICD-10-CM | POA: Diagnosis not present

## 2023-04-14 DIAGNOSIS — R4701 Aphasia: Secondary | ICD-10-CM | POA: Diagnosis not present

## 2023-04-14 DIAGNOSIS — Z803 Family history of malignant neoplasm of breast: Secondary | ICD-10-CM | POA: Diagnosis not present

## 2023-04-14 DIAGNOSIS — D696 Thrombocytopenia, unspecified: Secondary | ICD-10-CM | POA: Diagnosis not present

## 2023-04-14 DIAGNOSIS — R404 Transient alteration of awareness: Secondary | ICD-10-CM | POA: Diagnosis not present

## 2023-04-14 DIAGNOSIS — M79605 Pain in left leg: Secondary | ICD-10-CM | POA: Diagnosis not present

## 2023-04-14 DIAGNOSIS — S72002A Fracture of unspecified part of neck of left femur, initial encounter for closed fracture: Secondary | ICD-10-CM | POA: Diagnosis not present

## 2023-04-14 DIAGNOSIS — R2681 Unsteadiness on feet: Secondary | ICD-10-CM | POA: Insufficient documentation

## 2023-04-14 DIAGNOSIS — M25762 Osteophyte, left knee: Secondary | ICD-10-CM | POA: Diagnosis not present

## 2023-04-14 DIAGNOSIS — R269 Unspecified abnormalities of gait and mobility: Secondary | ICD-10-CM | POA: Diagnosis not present

## 2023-04-14 DIAGNOSIS — R0902 Hypoxemia: Secondary | ICD-10-CM | POA: Diagnosis not present

## 2023-04-14 DIAGNOSIS — R6889 Other general symptoms and signs: Secondary | ICD-10-CM | POA: Diagnosis not present

## 2023-04-14 DIAGNOSIS — Z515 Encounter for palliative care: Secondary | ICD-10-CM | POA: Diagnosis not present

## 2023-04-14 DIAGNOSIS — S0990XA Unspecified injury of head, initial encounter: Secondary | ICD-10-CM | POA: Diagnosis not present

## 2023-04-14 DIAGNOSIS — Z471 Aftercare following joint replacement surgery: Secondary | ICD-10-CM | POA: Diagnosis not present

## 2023-04-14 DIAGNOSIS — E876 Hypokalemia: Secondary | ICD-10-CM | POA: Diagnosis not present

## 2023-04-14 DIAGNOSIS — Z7401 Bed confinement status: Secondary | ICD-10-CM | POA: Diagnosis not present

## 2023-04-14 DIAGNOSIS — R296 Repeated falls: Secondary | ICD-10-CM | POA: Diagnosis not present

## 2023-04-14 DIAGNOSIS — S79929A Unspecified injury of unspecified thigh, initial encounter: Secondary | ICD-10-CM | POA: Diagnosis not present

## 2023-04-14 DIAGNOSIS — Z833 Family history of diabetes mellitus: Secondary | ICD-10-CM | POA: Diagnosis not present

## 2023-04-14 DIAGNOSIS — E559 Vitamin D deficiency, unspecified: Secondary | ICD-10-CM | POA: Diagnosis not present

## 2023-04-14 DIAGNOSIS — M48061 Spinal stenosis, lumbar region without neurogenic claudication: Secondary | ICD-10-CM | POA: Diagnosis not present

## 2023-04-14 DIAGNOSIS — S72032A Displaced midcervical fracture of left femur, initial encounter for closed fracture: Secondary | ICD-10-CM | POA: Diagnosis not present

## 2023-04-14 DIAGNOSIS — K5900 Constipation, unspecified: Secondary | ICD-10-CM | POA: Diagnosis not present

## 2023-04-14 DIAGNOSIS — D509 Iron deficiency anemia, unspecified: Secondary | ICD-10-CM | POA: Diagnosis not present

## 2023-04-14 DIAGNOSIS — Z043 Encounter for examination and observation following other accident: Secondary | ICD-10-CM | POA: Diagnosis not present

## 2023-04-14 DIAGNOSIS — F02C3 Dementia in other diseases classified elsewhere, severe, with mood disturbance: Secondary | ICD-10-CM | POA: Diagnosis not present

## 2023-04-14 DIAGNOSIS — R1312 Dysphagia, oropharyngeal phase: Secondary | ICD-10-CM | POA: Diagnosis not present

## 2023-04-14 DIAGNOSIS — I499 Cardiac arrhythmia, unspecified: Secondary | ICD-10-CM | POA: Diagnosis not present

## 2023-04-14 DIAGNOSIS — L89312 Pressure ulcer of right buttock, stage 2: Secondary | ICD-10-CM | POA: Diagnosis not present

## 2023-04-14 DIAGNOSIS — F32A Depression, unspecified: Secondary | ICD-10-CM | POA: Diagnosis not present

## 2023-04-14 DIAGNOSIS — R54 Age-related physical debility: Secondary | ICD-10-CM | POA: Diagnosis not present

## 2023-04-14 DIAGNOSIS — R600 Localized edema: Secondary | ICD-10-CM | POA: Diagnosis not present

## 2023-04-14 DIAGNOSIS — G309 Alzheimer's disease, unspecified: Secondary | ICD-10-CM | POA: Diagnosis not present

## 2023-04-14 DIAGNOSIS — Z91041 Radiographic dye allergy status: Secondary | ICD-10-CM | POA: Diagnosis not present

## 2023-04-14 DIAGNOSIS — S72012A Unspecified intracapsular fracture of left femur, initial encounter for closed fracture: Secondary | ICD-10-CM | POA: Diagnosis not present

## 2023-04-14 DIAGNOSIS — Z7189 Other specified counseling: Secondary | ICD-10-CM | POA: Diagnosis not present

## 2023-04-14 DIAGNOSIS — S72042D Displaced fracture of base of neck of left femur, subsequent encounter for closed fracture with routine healing: Secondary | ICD-10-CM | POA: Diagnosis not present

## 2023-04-14 DIAGNOSIS — Z6820 Body mass index (BMI) 20.0-20.9, adult: Secondary | ICD-10-CM | POA: Diagnosis not present

## 2023-04-14 DIAGNOSIS — M160 Bilateral primary osteoarthritis of hip: Secondary | ICD-10-CM | POA: Diagnosis not present

## 2023-04-14 LAB — CBC WITH DIFFERENTIAL/PLATELET
Abs Immature Granulocytes: 0.03 10*3/uL (ref 0.00–0.07)
Basophils Absolute: 0 10*3/uL (ref 0.0–0.1)
Basophils Relative: 0 %
Eosinophils Absolute: 0.1 10*3/uL (ref 0.0–0.5)
Eosinophils Relative: 1 %
HCT: 40.6 % (ref 36.0–46.0)
Hemoglobin: 13.3 g/dL (ref 12.0–15.0)
Immature Granulocytes: 0 %
Lymphocytes Relative: 10 %
Lymphs Abs: 0.8 10*3/uL (ref 0.7–4.0)
MCH: 31.3 pg (ref 26.0–34.0)
MCHC: 32.8 g/dL (ref 30.0–36.0)
MCV: 95.5 fL (ref 80.0–100.0)
Monocytes Absolute: 0.5 10*3/uL (ref 0.1–1.0)
Monocytes Relative: 6 %
Neutro Abs: 6.6 10*3/uL (ref 1.7–7.7)
Neutrophils Relative %: 83 %
Platelets: 195 10*3/uL (ref 150–400)
RBC: 4.25 MIL/uL (ref 3.87–5.11)
RDW: 14.6 % (ref 11.5–15.5)
WBC: 8.1 10*3/uL (ref 4.0–10.5)
nRBC: 0 % (ref 0.0–0.2)

## 2023-04-14 LAB — BASIC METABOLIC PANEL
Anion gap: 7 (ref 5–15)
BUN: 20 mg/dL (ref 8–23)
CO2: 27 mmol/L (ref 22–32)
Calcium: 9.2 mg/dL (ref 8.9–10.3)
Chloride: 105 mmol/L (ref 98–111)
Creatinine, Ser: 0.8 mg/dL (ref 0.44–1.00)
GFR, Estimated: >60 mL/min (ref >60)
Glucose, Bld: 105 mg/dL — ABNORMAL HIGH (ref 70–99)
Potassium: 3.7 mmol/L (ref 3.5–5.1)
Sodium: 139 mmol/L (ref 135–145)

## 2023-04-14 NOTE — ED Triage Notes (Signed)
Patient from [facility]. Hx of dementia. Staff got patient dressed around 0600, found on the floor around 0800. Patient is minimally verbal and confused at baseline. Does not follow commands. C-collar in place for precaution that was placed by EMS. Patient also had a fall yesterday on the right side.

## 2023-04-14 NOTE — ED Provider Notes (Signed)
[place] EMERGENCY DEPARTMENT AT [facility] Provider Note   CSN: [id number] Arrival date & time: 04/14/23  7253     History  Chief Complaint  Patient presents with   Alicia [name] is a 87 y.o. female.  Level 5 caveat secondary to dementia.  History is via EMS.  She is a resident at [facility].  Baseline appears to be nonverbal and noninteractive.  She was seen by staff at 6 AM and helped to get dressed.  At 8 AM they found her on the floor in front of her door.  Unwitnessed fall.  No other history available at this time.  Placed in c-collar by EMS for mechanism  The history is provided by the EMS personnel.  Fall This is a new problem. The current episode started 1 to 2 hours ago. The problem has not changed since onset.The treatment provided no relief.       Home Medications Prior to Admission medications   Medication Sig Start Date End Date Taking? Authorizing Provider  Cholecalciferol (VITAMIN D3) 50 MCG (2000 UT) TABS Take 2,000 Units by mouth daily.    [provider]  donepezil (ARICEPT) 10 MG tablet Take 1 tablet (10 mg total) by mouth at bedtime. Patient not taking: Reported on 09/07/2022 03/02/22   Glean Salvo, NP  donepezil (ARICEPT) 5 MG tablet TAKE ONE TABLET BY MOUTH AT bedtime 09/19/22   Glean Salvo, NP  ibuprofen (ADVIL) 200 MG tablet Take 400 mg by mouth every 8 (eight) hours as needed (for pain.). Patient not taking: Reported on 09/07/2022    [provider]  memantine (NAMENDA) 10 MG tablet Take 1 tablet (10 mg total) by mouth 2 (two) times daily. 03/02/22   Glean Salvo, NP  polyethylene glycol (MIRALAX / GLYCOLAX) 17 g packet daily. 1 packet mixed with 8 ounces of fluid    [provider]  rosuvastatin (CRESTOR) 5 MG tablet Take 5 mg by mouth at bedtime. 02/01/20   [provider]      Allergies    Contrast media [iodinated contrast media]    Review of Systems   Review of Systems  Unable to  perform ROS: Patient nonverbal    Physical Exam Updated Vital Signs BP (!) 109/97 (BP Location: Right Arm)   Pulse 64   Temp 97.6 F (36.4 C) (Axillary)   Resp 18   SpO2 100%  Physical Exam Vitals and nursing note reviewed.  Constitutional:      General: She is not in acute distress.    Appearance: Normal appearance. She is well-developed.  HENT:     Head: Normocephalic and atraumatic.  Eyes:     Conjunctiva/sclera: Conjunctivae normal.  Cardiovascular:     Rate and Rhythm: Normal rate and regular rhythm.     Heart sounds: No murmur heard. Pulmonary:     Effort: Pulmonary effort is normal. No respiratory distress.     Breath sounds: Normal breath sounds.  Abdominal:     Palpations: Abdomen is soft.     Tenderness: There is no abdominal tenderness. There is no guarding or rebound.  Musculoskeletal:        General: No deformity. Normal range of motion.     Cervical back: Neck supple.  Skin:    General: Skin is warm and dry.     Capillary Refill: Capillary refill takes less than 2 seconds.  Neurological:     General: No focal deficit present.  Comments: She is awake but not verbal and not following commands.  She does not appear to have any gross focal deficits.  Limited neurologic exam due to patient cooperation     ED Results / Procedures / Treatments   Labs (all labs ordered are listed, but only abnormal results are displayed) Labs Reviewed  BASIC METABOLIC PANEL - Abnormal; Notable for the following components:      Result Value   Glucose, Bld 105 (*)    All other components within normal limits  CBC WITH DIFFERENTIAL/PLATELET    EKG EKG Interpretation  Date/Time:  Friday Apr 14 2023 08:59:02 EDT Ventricular Rate:  62 PR Interval:  60 QRS Duration: 98 QT Interval:  457 QTC Calculation: 465 R Axis:   -21 Text Interpretation: Sinus rhythm Ventricular premature complex Short PR interval Borderline left axis deviation No significant change since prior  9/23 Confirmed by Meridee Score [phone]) on 04/14/2023 9:02:21 AM  Radiology CT Head Wo Contrast  Result Date: 04/14/2023 CLINICAL DATA:  Provided history: Head trauma, minor. Neck trauma. EXAM: CT HEAD WITHOUT CONTRAST CT CERVICAL SPINE WITHOUT CONTRAST TECHNIQUE: Multidetector CT imaging of the head and cervical spine was performed following the standard protocol without intravenous contrast. Multiplanar CT image reconstructions of the cervical spine were also generated. RADIATION DOSE REDUCTION: This exam was performed according to the departmental dose-optimization program which includes automated exposure control, adjustment of the mA and/or kV according to patient size and/or use of iterative reconstruction technique. COMPARISON:  Head CT 08/07/2022. FINDINGS: CT HEAD FINDINGS Brain: Moderate-to-advanced generalized cerebral atrophy. Patchy and ill-defined hypoattenuation within the cerebral white matter, nonspecific but compatible with moderate-to-advanced chronic small vessel ischemic disease. Prominent perivascular space within the inferior left basal ganglia. There is no acute intracranial hemorrhage. No demarcated cortical infarct. No extra-axial fluid collection. No evidence of an intracranial mass. No midline shift. Vascular: No hyperdense vessel. Atherosclerotic calcifications. Skull: No fracture or aggressive osseous lesion. Sinuses/Orbits: No mass or acute finding within the imaged orbits. Mild mucosal thickening within the bilateral frontal sinuses. CT CERVICAL SPINE FINDINGS Alignment: 2 mm grade 1 retrolisthesis at C3-C4 and C4-C5. Skull base and vertebrae: The basion-dental and atlanto-dental intervals are maintained.No evidence of acute fracture to the cervical spine. Soft tissues and spinal canal: No prevertebral fluid or swelling. No visible canal hematoma. Disc levels: Cervical spondylosis with multilevel disc space narrowing, disc bulges/central disc protrusions, posterior disc  osteophyte complexes, endplate spurring, uncovertebral hypertrophy and facet arthrosis. Disc space narrowing is greatest at C4-C5 (advanced at this level). Multilevel spinal canal stenosis most notably as follows. At C4-C5, a disc bulge contributes to at least moderate spinal canal stenosis. At C5-C6, a posterior disc osteophyte complex contributes to at least moderate spinal canal stenosis. Multilevel bony neural foraminal narrowing. Degenerative changes are also present at the C1-C2 articulation. Upper chest: No consolidation within the imaged lung apices. No visible pneumothorax. IMPRESSION: CT head: 1.  No evidence of an acute intracranial abnormality. 2. Moderate-to-advanced chronic small vessel ischemic changes within the cerebral white matter. 3. Moderate-to-advanced generalized cerebral atrophy. 4. Mild mucosal thickening within the bilateral frontal sinuses. CT cervical spine: 1. No evidence of acute fracture to the cervical spine. 2. 2 mm grade 1 retrolisthesis at C3-C4 and C4-C5. 3. Cervical spondylosis as described. At least moderate spinal canal stenosis at C4-C5 and C5-C6. Electronically Signed   By: Jackey Loge D.O.   On: 04/14/2023 11:13   CT Cervical Spine Wo Contrast  Result Date: 04/14/2023 CLINICAL DATA:  Provided history: Head trauma, minor. Neck trauma. EXAM: CT HEAD WITHOUT CONTRAST CT CERVICAL SPINE WITHOUT CONTRAST TECHNIQUE: Multidetector CT imaging of the head and cervical spine was performed following the standard protocol without intravenous contrast. Multiplanar CT image reconstructions of the cervical spine were also generated. RADIATION DOSE REDUCTION: This exam was performed according to the departmental dose-optimization program which includes automated exposure control, adjustment of the mA and/or kV according to patient size and/or use of iterative reconstruction technique. COMPARISON:  Head CT 08/07/2022. FINDINGS: CT HEAD FINDINGS Brain: Moderate-to-advanced generalized  cerebral atrophy. Patchy and ill-defined hypoattenuation within the cerebral white matter, nonspecific but compatible with moderate-to-advanced chronic small vessel ischemic disease. Prominent perivascular space within the inferior left basal ganglia. There is no acute intracranial hemorrhage. No demarcated cortical infarct. No extra-axial fluid collection. No evidence of an intracranial mass. No midline shift. Vascular: No hyperdense vessel. Atherosclerotic calcifications. Skull: No fracture or aggressive osseous lesion. Sinuses/Orbits: No mass or acute finding within the imaged orbits. Mild mucosal thickening within the bilateral frontal sinuses. CT CERVICAL SPINE FINDINGS Alignment: 2 mm grade 1 retrolisthesis at C3-C4 and C4-C5. Skull base and vertebrae: The basion-dental and atlanto-dental intervals are maintained.No evidence of acute fracture to the cervical spine. Soft tissues and spinal canal: No prevertebral fluid or swelling. No visible canal hematoma. Disc levels: Cervical spondylosis with multilevel disc space narrowing, disc bulges/central disc protrusions, posterior disc osteophyte complexes, endplate spurring, uncovertebral hypertrophy and facet arthrosis. Disc space narrowing is greatest at C4-C5 (advanced at this level). Multilevel spinal canal stenosis most notably as follows. At C4-C5, a disc bulge contributes to at least moderate spinal canal stenosis. At C5-C6, a posterior disc osteophyte complex contributes to at least moderate spinal canal stenosis. Multilevel bony neural foraminal narrowing. Degenerative changes are also present at the C1-C2 articulation. Upper chest: No consolidation within the imaged lung apices. No visible pneumothorax. IMPRESSION: CT head: 1.  No evidence of an acute intracranial abnormality. 2. Moderate-to-advanced chronic small vessel ischemic changes within the cerebral white matter. 3. Moderate-to-advanced generalized cerebral atrophy. 4. Mild mucosal thickening  within the bilateral frontal sinuses. CT cervical spine: 1. No evidence of acute fracture to the cervical spine. 2. 2 mm grade 1 retrolisthesis at C3-C4 and C4-C5. 3. Cervical spondylosis as described. At least moderate spinal canal stenosis at C4-C5 and C5-C6. Electronically Signed   By: Jackey Loge D.O.   On: 04/14/2023 11:13   DG Pelvis 1-2 Views  Result Date: 04/14/2023 CLINICAL DATA:  Fall. EXAM: PELVIS - 1-2 VIEW COMPARISON:  Pelvis x-ray dated July 20, 2022. FINDINGS: There is no evidence of pelvic fracture or diastasis. 2.9 cm mixed sclerotic and lucent lesion in the left subtrochanteric proximal femur is unchanged since 2020, benign. Unchanged mild bilateral hip osteoarthritis. IMPRESSION: 1. No acute osseous abnormality. Electronically Signed   By: Obie Dredge M.D.   On: 04/14/2023 09:53   DG Chest 1 View  Result Date: 04/14/2023 CLINICAL DATA:  Fall EXAM: CHEST  1 VIEW COMPARISON:  CXR 08/07/22 FINDINGS: Heart size is at the upper limits of normal. Normal mediastinal contours. No pleural effusion. No pneumothorax. No focal airspace opacity. The visualized skeletal structures are unremarkable. IMPRESSION: No radiographic evidence of thoracic injury. Electronically Signed   By: Lorenza Cambridge M.D.   On: 04/14/2023 09:43    Procedures Procedures    Medications Ordered in ED Medications - No data to display  ED Course/ Medical Decision Making/ A&P Clinical Course as of 04/14/23 1759  Fri Apr 14, 2023  0940 Chest x-ray interpreted by me as no pneumothorax no gross fractures.  Pelvis x-ray also does not appear acutely fractured or dislocated.  Awaiting radiology reading. [MB]  1144 CT is not crossing in epic.  CT head does not show any acute cranial abnormality and CT C-spine does not show any acute fracture. [MB]  1240 Patient's husband is here now and states she is at her baseline.  He has noted she has been more unsteady on her feet for the last month or so.  Nurse walked patient  and also felt her to be slightly unsteady although did okay.  Will put her in for a PT consult to see if she would benefit from a walker.  Not sure with her memory deficits whether she could be able to navigate this but will try. [MB]  1524 PT is recommending SNF.  TOC was consulted they said it would probably be unlikely to be placed until at least Monday.  Husband is at bedside and we reviewed this with him.  He would rather her go back to her facility and he can pursue more care there with her.  I do not think that is unreasonable with her dementia as keeping her in the ED as a border probably would be very disorienting to patient.  Husband is adamant that he will bring her back to the facility. [MB]    Clinical Course User Index [MB] Terrilee Files, MD                             Medical Decision Making Amount and/or Complexity of Data Reviewed Labs: ordered. Radiology: ordered.   This patient complains of fall unsteady gait; this involves an extensive number of treatment Options and is a complaint that carries with it a high risk of complications and morbidity. The differential includes unsteady gait, fracture, dislocation, contusion, bleed  I ordered, reviewed and interpreted labs, which included CBC normal chemistries normal I ordered imaging studies which included CT head and cervical spine, x-rays of chest and pelvis and I independently    visualized and interpreted imaging which showed no acute findings Additional history obtained from EMS and patient's husband Previous records obtained and reviewed in epic, patient has a few ED visits for falls I consulted physical therapy and transitions of care and discussed lab and imaging findings and discussed disposition.  Cardiac monitoring reviewed, sinus rhythm Social determinants considered, dementia unable to advocate for self Critical Interventions: None  After the interventions stated above, I reevaluated the patient and found  patient to be pleasant in no distress Admission and further testing considered, no indications for admission.  Patient may benefit from increased services at her facility or higher level of care.  Husband wishes to take her back to facility at this point.  Recommended close follow-up with PCP and return instructions discussed.         Final Clinical Impression(s) / ED Diagnoses Final diagnoses:  Fall, initial encounter  Unsteady gait when walking    Rx / DC Orders ED Discharge Orders     None         Terrilee Files, MD 04/14/23 1801

## 2023-04-14 NOTE — Progress Notes (Signed)
TOC CSW spoke with pts husband, [name] Hughley at bedside.  They are not interested in SNF.  They are currently returning to [facility].  Rondel Episcopo Tarpley-Carter, MSW, LCSW-A Pronouns:  She/Her/Hers [facility] HealthTransitions of Care Clinical Social Worker Direct Number:  [phone] [email or web address]

## 2023-04-14 NOTE — Discharge Instructions (Signed)
You were seen in the emergency department for evaluation of injuries from a fall.  You had a CAT scan of your head and neck that did not show any serious findings.  You were unsteady on your feet walking here and you may benefit from physical therapy or increase level of services at your facility.  Please follow-up with your regular doctor.  Return to the emergency department if any worsening or concerning symptoms

## 2023-04-14 NOTE — ED Notes (Signed)
This RN spoke with social work about plan of care for this patient and status of placement for assisted living. Social worker informed me that the patient would most likely not have placement until Monday due to it being the weekend. Patient's husband (primary decision maker) was informed of this information and did not want patient to be admitted to the hospital over the weekend and stated he would rather take her back to [facility]. Dr. Charm Barges was informed of this. Discharge paperwork was given to patient's husband, who verbalized understanding of paperwork. Attempted to call nursing report to [facility] with no answer. A detailed message was left for nursing with a callback number if they have any questions. Discharge paperwork outlines recommendations from ED provider.

## 2023-04-14 NOTE — ED Notes (Signed)
Ambulated with patient outside of room and back to bed. Patient unsteady with ambulation. Dr. Charm Barges notified, PT consult ordered

## 2023-04-14 NOTE — Evaluation (Signed)
Physical Therapy Evaluation Patient Details Name: Alicia [name] MRN: [id number] DOB: 03-01-1936 Today's Date: 04/14/2023  History of Present Illness  87 y.o. female presents to [facility] hospital after an unwitnessed fall at [employer]. Pt with dementia at baseline and is minimally verbal. PMH includes spinal stenosis, syncope.  Clinical Impression  Pt presents to PT with deficits in gait, balance, endurance, cognition. Pt has a history of advanced dementia, pt's spouse reports a recent decline in balance with multiple falls since moving to [facility] memory care 5 months ago. Pt requires assistance to stand and to ambulate at this time, demonstrates poor ability to effectively utilize a RW to improve stability. With continued practice utilizing a RW the pt may demonstrate improved stability. PT recommends short term inpatient PT services in an effort to improve balance and reduce falls risk.     Recommendations for follow up therapy are one component of a multi-disciplinary discharge planning process, led by the attending physician.  Recommendations may be updated based on patient status, additional functional criteria and insurance authorization.  Follow Up Recommendations Can patient physically be transported by private vehicle: Yes     Assistance Recommended at Discharge Frequent or constant Supervision/Assistance  Patient can return home with the following  A lot of help with walking and/or transfers;A lot of help with bathing/dressing/bathroom;Assistance with cooking/housework;Assistance with feeding;Direct supervision/assist for medications management;Direct supervision/assist for financial management;Assist for transportation;Help with stairs or ramp for entrance    Equipment Recommendations  (defer to post-acute)  Recommendations for Other Services       Functional Status Assessment Patient has had a recent decline in their functional status and demonstrates the ability to make  significant improvements in function in a reasonable and predictable amount of time.     Precautions / Restrictions Precautions Precautions: Fall Precaution Comments: advanced dementia Restrictions Weight Bearing Restrictions: No      Mobility  Bed Mobility Overal bed mobility: Needs Assistance Bed Mobility: Supine to Sit, Sit to Supine     Supine to sit: Min assist, HOB elevated Sit to supine: Min assist, HOB elevated        Transfers Overall transfer level: Needs assistance Equipment used: 1 person hand held assist Transfers: Sit to/from Stand Sit to Stand: Mod assist           General transfer comment: right lateral lean    Ambulation/Gait Ambulation/Gait assistance: Min assist Gait Distance (Feet): 80 Feet Assistive device: Rolling walker (2 wheels) Gait Pattern/deviations: Step-to pattern Gait velocity: reduced Gait velocity interpretation: <1.31 ft/sec, indicative of household ambulator   General Gait Details: slowed step-to gait, lateral drift to left. Pt with poor management of RW, with walker angled one a diagonal to the left compared to patient hips and shoulders.  Stairs            Wheelchair Mobility    Modified Rankin (Stroke Patients Only)       Balance Overall balance assessment: Needs assistance Sitting-balance support: No upper extremity supported, Feet supported Sitting balance-Leahy Scale: Fair     Standing balance support: Single extremity supported, Bilateral upper extremity supported, Reliant on assistive device for balance Standing balance-Leahy Scale: Poor                               Pertinent Vitals/Pain Pain Assessment Pain Assessment: PAINAD Breathing: normal Negative Vocalization: none Facial Expression: smiling or inexpressive Body Language: relaxed Consolability: no need to console PAINAD Score: 0  Home Living Family/patient expects to be discharged to:: Other (Comment)                    Additional Comments: memory care at [facility]    Prior Function Prior Level of Function : Needs assist  Cognitive Assist : Mobility (cognitive);ADLs (cognitive)           Mobility Comments: pt typically ambulates without DME, spouse reports a gradual decline in balance, pt has had 3 falls since moving to [facility] 5 months ago ADLs Comments: pt is independent with toileting, requires assistance with dressing, other ADLs     Hand Dominance        Extremity/Trunk Assessment   Upper Extremity Assessment Upper Extremity Assessment: Generalized weakness    Lower Extremity Assessment Lower Extremity Assessment: Generalized weakness    Cervical / Trunk Assessment Cervical / Trunk Assessment: Kyphotic  Communication   Communication: Expressive difficulties;Receptive difficulties  Cognition Arousal/Alertness: Awake/alert Behavior During Therapy: Impulsive Overall Cognitive Status: History of cognitive impairments - at baseline                                 General Comments: history of advanced dementia, largely non-verbal. Pt with increased time to follow simple commands, needs visual and tactile cues to assist.        General Comments General comments (skin integrity, edema, etc.): VSS on RA    Exercises     Assessment/Plan    PT Assessment Patient needs continued PT services  PT Problem List Decreased strength;Decreased activity tolerance;Decreased balance;Decreased mobility;Decreased cognition;Decreased knowledge of use of DME;Decreased safety awareness;Decreased knowledge of precautions       PT Treatment Interventions DME instruction;Gait training;Functional mobility training;Therapeutic activities;Therapeutic exercise;Balance training;Neuromuscular re-education;Patient/family education;Cognitive remediation;Wheelchair mobility training    PT Goals (Current goals can be found in the Care Plan section)  Acute Rehab PT Goals Patient Stated  Goal: to reduce falls risk PT Goal Formulation: With family Time For Goal Achievement: 04/28/23 Potential to Achieve Goals: Fair    Frequency Min 2X/week     Co-evaluation               AM-PAC PT "6 Clicks" Mobility  Outcome Measure Help needed turning from your back to your side while in a flat bed without using bedrails?: A Little Help needed moving from lying on your back to sitting on the side of a flat bed without using bedrails?: A Little Help needed moving to and from a bed to a chair (including a wheelchair)?: A Little Help needed standing up from a chair using your arms (e.g., wheelchair or bedside chair)?: A Lot Help needed to walk in hospital room?: A Little Help needed climbing 3-5 steps with a railing? : Total 6 Click Score: 15    End of Session Equipment Utilized During Treatment: Gait belt Activity Tolerance: Patient tolerated treatment well Patient left: in bed;with call bell/phone within reach;with family/visitor present Nurse Communication: Mobility status PT Visit Diagnosis: Unsteadiness on feet (R26.81);Other abnormalities of gait and mobility (R26.89);Repeated falls (R29.6)    Time: 6213-0865 PT Time Calculation (min) (ACUTE ONLY): 22 min   Charges:   PT Evaluation $PT Eval Low Complexity: 1 Low          Arlyss Gandy, PT, DPT Acute Rehabilitation Office [phone]   Arlyss Gandy 04/14/2023, 2:21 PM

## 2023-04-14 NOTE — ED Notes (Signed)
Transported to CT 

## 2023-04-17 ENCOUNTER — Encounter (HOSPITAL_COMMUNITY): Payer: Self-pay | Admitting: Internal Medicine

## 2023-04-17 ENCOUNTER — Emergency Department (HOSPITAL_COMMUNITY): Payer: Medicare Other

## 2023-04-17 ENCOUNTER — Inpatient Hospital Stay (HOSPITAL_COMMUNITY)
Admission: EM | Admit: 2023-04-17 | Discharge: 2023-04-26 | DRG: 522 | Disposition: A | Discharge status: Skilled Nursing Facility | Payer: Medicare Other | Source: Skilled Nursing Facility | Attending: Internal Medicine | Admitting: Internal Medicine

## 2023-04-17 ENCOUNTER — Other Ambulatory Visit: Payer: Self-pay

## 2023-04-17 DIAGNOSIS — R54 Age-related physical debility: Secondary | ICD-10-CM | POA: Diagnosis present

## 2023-04-17 DIAGNOSIS — I10 Essential (primary) hypertension: Secondary | ICD-10-CM | POA: Diagnosis present

## 2023-04-17 DIAGNOSIS — R4701 Aphasia: Secondary | ICD-10-CM | POA: Diagnosis not present

## 2023-04-17 DIAGNOSIS — E785 Hyperlipidemia, unspecified: Secondary | ICD-10-CM | POA: Diagnosis present

## 2023-04-17 DIAGNOSIS — Z87891 Personal history of nicotine dependence: Secondary | ICD-10-CM

## 2023-04-17 DIAGNOSIS — F0280 Dementia in other diseases classified elsewhere without behavioral disturbance: Secondary | ICD-10-CM | POA: Diagnosis not present

## 2023-04-17 DIAGNOSIS — E440 Moderate protein-calorie malnutrition: Secondary | ICD-10-CM | POA: Insufficient documentation

## 2023-04-17 DIAGNOSIS — K5900 Constipation, unspecified: Secondary | ICD-10-CM | POA: Diagnosis present

## 2023-04-17 DIAGNOSIS — G309 Alzheimer's disease, unspecified: Secondary | ICD-10-CM | POA: Diagnosis present

## 2023-04-17 DIAGNOSIS — Z9842 Cataract extraction status, left eye: Secondary | ICD-10-CM

## 2023-04-17 DIAGNOSIS — W19XXXA Unspecified fall, initial encounter: Secondary | ICD-10-CM | POA: Diagnosis not present

## 2023-04-17 DIAGNOSIS — Z87440 Personal history of urinary (tract) infections: Secondary | ICD-10-CM

## 2023-04-17 DIAGNOSIS — R2681 Unsteadiness on feet: Secondary | ICD-10-CM | POA: Diagnosis present

## 2023-04-17 DIAGNOSIS — S79929A Unspecified injury of unspecified thigh, initial encounter: Secondary | ICD-10-CM | POA: Diagnosis not present

## 2023-04-17 DIAGNOSIS — R296 Repeated falls: Secondary | ICD-10-CM

## 2023-04-17 DIAGNOSIS — F32A Depression, unspecified: Secondary | ICD-10-CM

## 2023-04-17 DIAGNOSIS — F02C3 Dementia in other diseases classified elsewhere, severe, with mood disturbance: Secondary | ICD-10-CM | POA: Diagnosis present

## 2023-04-17 DIAGNOSIS — Z803 Family history of malignant neoplasm of breast: Secondary | ICD-10-CM | POA: Diagnosis not present

## 2023-04-17 DIAGNOSIS — S72002A Fracture of unspecified part of neck of left femur, initial encounter for closed fracture: Principal | ICD-10-CM | POA: Diagnosis present

## 2023-04-17 DIAGNOSIS — Z751 Person awaiting admission to adequate facility elsewhere: Secondary | ICD-10-CM

## 2023-04-17 DIAGNOSIS — M79605 Pain in left leg: Secondary | ICD-10-CM | POA: Diagnosis not present

## 2023-04-17 DIAGNOSIS — Z6820 Body mass index (BMI) 20.0-20.9, adult: Secondary | ICD-10-CM | POA: Diagnosis not present

## 2023-04-17 DIAGNOSIS — S72009A Fracture of unspecified part of neck of unspecified femur, initial encounter for closed fracture: Secondary | ICD-10-CM | POA: Diagnosis present

## 2023-04-17 DIAGNOSIS — F0390 Unspecified dementia without behavioral disturbance: Secondary | ICD-10-CM | POA: Diagnosis not present

## 2023-04-17 DIAGNOSIS — Z833 Family history of diabetes mellitus: Secondary | ICD-10-CM

## 2023-04-17 DIAGNOSIS — Z7189 Other specified counseling: Secondary | ICD-10-CM | POA: Diagnosis not present

## 2023-04-17 DIAGNOSIS — D696 Thrombocytopenia, unspecified: Secondary | ICD-10-CM | POA: Diagnosis not present

## 2023-04-17 DIAGNOSIS — R0902 Hypoxemia: Secondary | ICD-10-CM | POA: Diagnosis not present

## 2023-04-17 DIAGNOSIS — Z66 Do not resuscitate: Secondary | ICD-10-CM | POA: Diagnosis present

## 2023-04-17 DIAGNOSIS — E876 Hypokalemia: Secondary | ICD-10-CM | POA: Diagnosis not present

## 2023-04-17 DIAGNOSIS — D509 Iron deficiency anemia, unspecified: Secondary | ICD-10-CM | POA: Diagnosis present

## 2023-04-17 DIAGNOSIS — S72032A Displaced midcervical fracture of left femur, initial encounter for closed fracture: Secondary | ICD-10-CM | POA: Diagnosis not present

## 2023-04-17 DIAGNOSIS — Z515 Encounter for palliative care: Secondary | ICD-10-CM

## 2023-04-17 DIAGNOSIS — Z79899 Other long term (current) drug therapy: Secondary | ICD-10-CM

## 2023-04-17 DIAGNOSIS — S72002P Fracture of unspecified part of neck of left femur, subsequent encounter for closed fracture with malunion: Secondary | ICD-10-CM | POA: Diagnosis not present

## 2023-04-17 DIAGNOSIS — Z96642 Presence of left artificial hip joint: Secondary | ICD-10-CM | POA: Diagnosis not present

## 2023-04-17 DIAGNOSIS — Z471 Aftercare following joint replacement surgery: Secondary | ICD-10-CM | POA: Diagnosis not present

## 2023-04-17 DIAGNOSIS — Z743 Need for continuous supervision: Secondary | ICD-10-CM | POA: Diagnosis not present

## 2023-04-17 DIAGNOSIS — W010XXA Fall on same level from slipping, tripping and stumbling without subsequent striking against object, initial encounter: Secondary | ICD-10-CM | POA: Diagnosis present

## 2023-04-17 DIAGNOSIS — Z91041 Radiographic dye allergy status: Secondary | ICD-10-CM | POA: Diagnosis not present

## 2023-04-17 DIAGNOSIS — M1712 Unilateral primary osteoarthritis, left knee: Secondary | ICD-10-CM | POA: Diagnosis present

## 2023-04-17 LAB — COMPREHENSIVE METABOLIC PANEL
ALT: 15 U/L (ref 0–44)
AST: 18 U/L (ref 15–41)
Albumin: 3.7 g/dL (ref 3.5–5.0)
Alkaline Phosphatase: 50 U/L (ref 38–126)
Anion gap: 8 (ref 5–15)
BUN: 18 mg/dL (ref 8–23)
CO2: 28 mmol/L (ref 22–32)
Calcium: 9 mg/dL (ref 8.9–10.3)
Chloride: 103 mmol/L (ref 98–111)
Creatinine, Ser: 0.59 mg/dL (ref 0.44–1.00)
GFR, Estimated: >60 mL/min (ref >60)
Glucose, Bld: 100 mg/dL — ABNORMAL HIGH (ref 70–99)
Potassium: 3.5 mmol/L (ref 3.5–5.1)
Sodium: 139 mmol/L (ref 135–145)
Total Bilirubin: 1.7 mg/dL — ABNORMAL HIGH (ref 0.3–1.2)
Total Protein: 7.2 g/dL (ref 6.5–8.1)

## 2023-04-17 LAB — CBC WITH DIFFERENTIAL/PLATELET
Abs Immature Granulocytes: 0.02 10*3/uL (ref 0.00–0.07)
Basophils Absolute: 0 10*3/uL (ref 0.0–0.1)
Basophils Relative: 0 %
Eosinophils Absolute: 0.1 10*3/uL (ref 0.0–0.5)
Eosinophils Relative: 2 %
HCT: 38.8 % (ref 36.0–46.0)
Hemoglobin: 12.6 g/dL (ref 12.0–15.0)
Immature Granulocytes: 0 %
Lymphocytes Relative: 15 %
Lymphs Abs: 1.1 10*3/uL (ref 0.7–4.0)
MCH: 30.8 pg (ref 26.0–34.0)
MCHC: 32.5 g/dL (ref 30.0–36.0)
MCV: 94.9 fL (ref 80.0–100.0)
Monocytes Absolute: 0.7 10*3/uL (ref 0.1–1.0)
Monocytes Relative: 9 %
Neutro Abs: 5.7 10*3/uL (ref 1.7–7.7)
Neutrophils Relative %: 74 %
Platelets: 166 10*3/uL (ref 150–400)
RBC: 4.09 MIL/uL (ref 3.87–5.11)
RDW: 14.6 % (ref 11.5–15.5)
WBC: 7.7 10*3/uL (ref 4.0–10.5)
nRBC: 0 % (ref 0.0–0.2)

## 2023-04-17 LAB — TYPE AND SCREEN
ABO/RH(D): A POS
Antibody Screen: NEGATIVE

## 2023-04-17 LAB — ABO/RH: ABO/RH(D): A POS

## 2023-04-17 MED ORDER — HYDROCODONE-ACETAMINOPHEN 5-325 MG PO TABS
1.0000 | ORAL_TABLET | Freq: Four times a day (QID) | ORAL | Status: DC | PRN
  Filled 2023-04-17: qty 1

## 2023-04-17 MED ORDER — ROSUVASTATIN CALCIUM 5 MG PO TABS
5.0000 mg | ORAL_TABLET | Freq: Every day | ORAL | Status: DC
  Administered 2023-04-18 – 2023-04-26 (×8): 5 mg via ORAL
  Filled 2023-04-17 (×10): qty 1

## 2023-04-17 MED ORDER — HALOPERIDOL LACTATE 5 MG/ML IJ SOLN: 2.0000 mg | Freq: Four times a day (QID) | INTRAMUSCULAR | Status: DC | PRN

## 2023-04-17 MED ORDER — SODIUM CHLORIDE 0.9 % IV SOLN: INTRAVENOUS | Status: DC

## 2023-04-17 MED ORDER — DONEPEZIL HCL 10 MG PO TABS
5.0000 mg | ORAL_TABLET | Freq: Every day | ORAL | Status: DC
  Administered 2023-04-18 – 2023-04-26 (×8): 5 mg via ORAL
  Filled 2023-04-17 (×9): qty 1

## 2023-04-17 MED ORDER — MEMANTINE HCL 10 MG PO TABS
10.0000 mg | ORAL_TABLET | Freq: Two times a day (BID) | ORAL | Status: DC
  Administered 2023-04-17 – 2023-04-26 (×17): 10 mg via ORAL
  Filled 2023-04-17 (×15): qty 1
  Filled 2023-04-17: qty 2
  Filled 2023-04-17 (×2): qty 1

## 2023-04-17 MED ORDER — MORPHINE SULFATE (PF) 2 MG/ML IV SOLN
0.5000 mg | INTRAVENOUS | Status: DC | PRN
  Administered 2023-04-17 – 2023-04-18 (×2): 0.5 mg via INTRAVENOUS
  Filled 2023-04-17 (×2): qty 1

## 2023-04-17 MED ORDER — VITAMIN D 25 MCG (1000 UNIT) PO TABS
2000.0000 [IU] | ORAL_TABLET | Freq: Every day | ORAL | Status: DC
  Administered 2023-04-18 – 2023-04-26 (×8): 2000 [IU] via ORAL
  Filled 2023-04-17 (×9): qty 2

## 2023-04-17 MED ORDER — POLYETHYLENE GLYCOL 3350 17 G PO PACK
17.0000 g | PACK | Freq: Two times a day (BID) | ORAL | Status: DC
  Administered 2023-04-17: 17 g via ORAL
  Filled 2023-04-17: qty 1

## 2023-04-17 MED ORDER — SENNOSIDES-DOCUSATE SODIUM 8.6-50 MG PO TABS
2.0000 | ORAL_TABLET | Freq: Every day | ORAL | Status: DC
  Administered 2023-04-17 – 2023-04-25 (×8): 2 via ORAL
  Filled 2023-04-17 (×9): qty 2

## 2023-04-17 MED ORDER — FLEET ENEMA 7-19 GM/118ML RE ENEM
1.0000 | ENEMA | Freq: Once | RECTAL | Status: AC
  Administered 2023-04-17: 1 via RECTAL
  Filled 2023-04-17: qty 1

## 2023-04-17 MED ORDER — BISACODYL 10 MG RE SUPP: 10.0000 mg | Freq: Every day | RECTAL | Status: DC | PRN

## 2023-04-17 NOTE — Progress Notes (Signed)
Patient ID: Alicia [name], female   DOB: 03-25-1936, 87 y.o.   MRN: [id number] I have reviewed the x-rays and CT scan was patient's left hip.  She does have a minimally displaced to almost nondisplaced femoral neck fracture.  However she has had multiple falls and spoke with her husband who states that she does walk.  She does have significant profound dementia and this is going to be certainly an issue for her.  Apparently my partner Dr. [name] did perform a microdiscectomy about a decade ago on her.  Her husband does not remember that much but we have been asked to resume her orthopedic care.  I have asked that she be admitted to the medicine service and transferred to [facility] due to the potential need for surgery in the coming days.  Since Dr. [name] does operate on Wednesday as do some of my partners, it is better to have her over at [facility] in order to have her on the operating room schedule if her husband decides that is in her best interest and we collectively decided that as well.

## 2023-04-17 NOTE — ED Provider Notes (Signed)
[facility] EMERGENCY DEPARTMENT AT [facility] Provider Note   CSN: [id number] Arrival date & time: 04/17/23  1542     History  Chief Complaint  Patient presents with   Leg Injury   Fall    TANJALA SINAGRA is a 87 y.o. female.  Patient brought in by EMS from [facility] nursing facility.  Patient was has a history of frequent falls.  Was seen in the [facility] ED on Friday following a fall.  Said like there was probably a fall on Saturday and as well as a fall yesterday selects that she falls pretty frequently patient had x-ray apparently done at the nursing facility came in without any paperwork concerned where that fracture was we noted on exam the patient had swelling of her left knee did not have any deformities or shortening of her legs.  Patient has severe dementia she is currently at baseline.  Past medical history otherwise significant for frequent urinary tract infections hypertension past surgical history was for lumbar laminectomy done by Dr. [name] in 2014.  Patient is a former smoker.  As noted has severe dementia.  Patient's husband answered most of the questions and concerns.       Home Medications Prior to Admission medications   Medication Sig Start Date End Date Taking? Authorizing Provider  Cholecalciferol (VITAMIN D3) 50 MCG (2000 UT) TABS Take 2,000 Units by mouth daily.    [provider]  donepezil (ARICEPT) 10 MG tablet Take 1 tablet (10 mg total) by mouth at bedtime. Patient not taking: Reported on 09/07/2022 03/02/22   Glean Salvo, NP  donepezil (ARICEPT) 5 MG tablet TAKE ONE TABLET BY MOUTH AT bedtime 09/19/22   Glean Salvo, NP  ibuprofen (ADVIL) 200 MG tablet Take 400 mg by mouth every 8 (eight) hours as needed (for pain.). Patient not taking: Reported on 09/07/2022    [provider]  memantine (NAMENDA) 10 MG tablet Take 1 tablet (10 mg total) by mouth 2 (two) times daily. 03/02/22   Glean Salvo, NP  polyethylene glycol  (MIRALAX / GLYCOLAX) 17 g packet daily. 1 packet mixed with 8 ounces of fluid    [provider]  rosuvastatin (CRESTOR) 5 MG tablet Take 5 mg by mouth at bedtime. 02/01/20   [provider]      Allergies    Contrast media [iodinated contrast media]    Review of Systems   Review of Systems  Unable to perform ROS: Dementia  Psychiatric/Behavioral:  Negative for confusion.     Physical Exam Updated Vital Signs BP (!) 146/96   Pulse (!) 58   Temp 98.5 F (36.9 C)   Resp 18   Ht 1.676 m (5\' 6" )   Wt 59 kg   SpO2 95%   BMI 20.98 kg/m  Physical Exam Vitals and nursing note reviewed.  Constitutional:      General: She is not in acute distress.    Appearance: Normal appearance. She is well-developed.  HENT:     Head: Normocephalic and atraumatic.  Eyes:     Extraocular Movements: Extraocular movements intact.     Conjunctiva/sclera: Conjunctivae normal.     Pupils: Pupils are equal, round, and reactive to light.  Cardiovascular:     Rate and Rhythm: Normal rate and regular rhythm.     Heart sounds: No murmur heard. Pulmonary:     Effort: Pulmonary effort is normal. No respiratory distress.     Breath sounds: Normal breath sounds.  Abdominal:     Palpations: Abdomen is soft.     Tenderness: There is no abdominal tenderness.  Musculoskeletal:        General: Swelling present.     Cervical back: Neck supple.     Comments: Swelling noted to the left knee without any erythema.  Patient is lower extremities dorsalis pedis pulses 1+ bilaterally.  No obvious tenderness to foot ankle tib-fib really no tenderness necessarily to the knee but knee Is not dislocated.  No obvious deformity of the femur.  No discomfort with range of motion of the left hip.  And no shortening of the leg.  Other extremities without any obvious injuries.  Skin:    General: Skin is warm and dry.     Capillary Refill: Capillary refill takes less than 2 seconds.  Neurological:     Mental  Status: She is alert. Mental status is at baseline.  Psychiatric:        Mood and Affect: Mood normal.     ED Results / Procedures / Treatments   Labs (all labs ordered are listed, but only abnormal results are displayed) Labs Reviewed  COMPREHENSIVE METABOLIC PANEL - Abnormal; Notable for the following components:      Result Value   Glucose, Bld 100 (*)    Total Bilirubin 1.7 (*)    All other components within normal limits  CBC WITH DIFFERENTIAL/PLATELET    EKG None  Radiology CT Hip Left Wo Contrast  Result Date: 04/17/2023 CLINICAL DATA:  Possible left hip fracture.  Hip trauma EXAM: CT OF THE LEFT HIP WITHOUT CONTRAST TECHNIQUE: Multidetector CT imaging of the left hip was performed according to the standard protocol. Multiplanar CT image reconstructions were also generated. RADIATION DOSE REDUCTION: This exam was performed according to the departmental dose-optimization program which includes automated exposure control, adjustment of the mA and/or kV according to patient size and/or use of iterative reconstruction technique. COMPARISON:  Radiographs 04/17/2023 FINDINGS: Bones/Joint/Cartilage There is cortical disruption of the anterior cortex of the femoral neck near the head neck junction (series 3/image 81 and 8/64). Irregular sclerosis at the head neck junction. Findings are suspicious for nondisplaced fracture. Degenerative changes about the left hip, SI joint, and visualized lumbar spine. Ligaments Suboptimally assessed by CT. Muscles and Tendons Unremarkable CT appearance. Soft tissues Large stool ball in the rectum with rectal mild wall thickening. Mild subcutaneous edema in the left gluteal soft tissues. IMPRESSION: 1. Findings suspicious for nondisplaced subcapital left femoral neck fracture. Consider MRI for confirmation. 2. Large stool ball in the rectum with mild rectal wall thickening suggesting impaction and stercoral colitis. Electronically Signed   By: Minerva Fester  M.D.   On: 04/17/2023 18:58   DG Tibia/Fibula Left  Result Date: 04/17/2023 CLINICAL DATA:  Fall yesterday. EXAM: LEFT TIBIA AND FIBULA - 2 VIEW COMPARISON:  None Available. FINDINGS: There is no evidence of fracture or other focal bone lesions. Soft tissues are unremarkable. IMPRESSION: Negative. Electronically Signed   By: Lupita Raider M.D.   On: 04/17/2023 18:54   DG Femur 1V Left  Result Date: 04/17/2023 CLINICAL DATA:  Fall yesterday. EXAM: LEFT FEMUR 1 VIEW COMPARISON:  None Available. FINDINGS: Minimally displaced subcapital fracture of proximal left femur cannot be excluded. Correlation with CT findings of same day is recommended. No abnormality seen involving middle and distal portions of the left femur. IMPRESSION: Findings concerning for possible minimally displaced subcapital fracture of proximal left femur. Correlation with CT findings is recommended. Electronically Signed  By: Lupita Raider M.D.   On: 04/17/2023 18:52   DG Knee Complete 4 Views Left  Result Date: 04/17/2023 CLINICAL DATA:  Larey Seat EXAM: LEFT KNEE - COMPLETE 4+ VIEW COMPARISON:  None Available. FINDINGS: Frontal, bilateral oblique, and cross-table lateral views of the left knee are obtained. Diffuse osteopenia. There are no acute displaced fractures. Mild 3 compartmental osteoarthritis. Trace joint effusion. Soft tissues are unremarkable. IMPRESSION: 1. No acute fracture. 2. Mild 3 compartmental osteoarthritis and likely reactive trace suprapatellar joint effusion. 3. Diffuse osteopenia. Electronically Signed   By: Sharlet Salina M.D.   On: 04/17/2023 17:45   DG Hips Bilat W or Wo Pelvis 3-4 Views  Result Date: 04/17/2023 CLINICAL DATA:  Larey Seat yesterday, confusion EXAM: DG HIP (WITH OR WITHOUT PELVIS) 3-4V BILAT COMPARISON:  03/26/2020 FINDINGS: Frontal view of the pelvis, frontal and frogleg lateral views of the right hip, and frontal and cross-table lateral views of the left hip are obtained on 5 images. There is  severe bilateral hip osteoarthritis. Subtle sclerosis seen in the subcapital region of the left femoral neck likely reflects a collar of osteophytes rather than nondisplaced fracture. Underlying trabecula appear intact. If nondisplaced fracture is a concern, CT could be considered. No other signs of acute displaced fracture. Remainder of the bony pelvis is unremarkable. Large amount of stool within the rectal vault consistent with fecal impaction. IMPRESSION: 1. Severe bilateral hip osteoarthritis. 2. Left femoral neck subcapital sclerosis favors abundant osteophytes over nondisplaced fracture. The underlying trabecula appear intact. If there is concern for nondisplaced left hip fracture a CT could be performed. 3. Otherwise no acute bony abnormalities. 4. Large amount of retained stool in the rectal vault concerning for fecal impaction. Electronically Signed   By: Sharlet Salina M.D.   On: 04/17/2023 17:45   CT Head Wo Contrast  Result Date: 04/17/2023 CLINICAL DATA:  Dementia, fell yesterday EXAM: CT HEAD WITHOUT CONTRAST TECHNIQUE: Contiguous axial images were obtained from the base of the skull through the vertex without intravenous contrast. RADIATION DOSE REDUCTION: This exam was performed according to the departmental dose-optimization program which includes automated exposure control, adjustment of the mA and/or kV according to patient size and/or use of iterative reconstruction technique. COMPARISON:  04/14/2023 FINDINGS: Brain: Stable chronic small-vessel ischemic changes throughout the periventricular white matter. No evidence of acute infarct or hemorrhage. Lateral ventricles and midline structures are stable. No acute extra-axial fluid collections. No mass effect. Vascular: Stable atherosclerosis.  No hyperdense vessel. Skull: Normal. Negative for fracture or focal lesion. Sinuses/Orbits: No acute finding. Other: None. IMPRESSION: 1. Stable head CT, no acute intracranial process. Electronically  Signed   By: Sharlet Salina M.D.   On: 04/17/2023 17:15    Procedures Procedures    Medications Ordered in ED Medications  0.9 %  sodium chloride infusion ( Intravenous New Bag/Given 04/17/23 1745)    ED Course/ Medical Decision Making/ A&P                             Medical Decision Making Amount and/or Complexity of Data Reviewed Labs: ordered. Radiology: ordered.  Risk Prescription drug management. Decision regarding hospitalization.  X-ray of pelvis and both hips without any definitive signs of fracture.  X-ray of the left knee showed 3 compartmental osteoarthritis and likely reactive trace suprapatellar joint effusion.  Based on this went head x-ray tib-fib and femur.  X-ray of the femur raised concerns for a newly displaced left subcu  hip fracture.  Based on this 1 had got CT of the left hip and based on that findings were suspicious for nondisplaced subcapital left femoral neck fracture.  Head CT was negative.  CBC without any acute findings complete metabolic panel total bili was 1.7 renal function was normal electrolytes were normal LFTs were normal.  Discussed with on-call Ortho care patient seen by Dr. [name] in the past.  Discussed with Dr. Magnus Ivan we will make arrangements for her to be admitted to hospital service at [facility] Dr. [name] only operates at [facility].  Discussed with the hospitalist they will make arrangements for admission.  Final Clinical Impression(s) / ED Diagnoses Final diagnoses:  Closed left hip fracture, initial encounter [facility])  Fall, initial encounter    Rx / DC Orders ED Discharge Orders     None         Vanetta Mulders, MD 04/17/23 2121

## 2023-04-17 NOTE — H&P (Signed)
History and Physical    Patient: Alicia [name] WUJ:[id number] DOB: [birth year] DOA: 04/17/2023 DOS: the patient was seen and examined on 04/17/2023 PCP: [name], MD  Patient coming from: ALF/ILF  Chief Complaint:  Chief Complaint  Patient presents with   Leg Injury   Fall   HPI: Alicia [name] is a 87 y.o. female with medical history significant of advanced dementia who presents from her memory care unit at episode following a mechanical fall-upon further evaluation in the ED she was found to have left hip fracture.  Please note-patient is a poor historian, most of this history is obtained after reviewing ED note, and speaking with the husband at bedside.  Patient lives in [place] memory care unit-her husband lives in independent living.  Patient apparently has advanced dementia but is able to ambulate around pretty independently.  Over the past several days she has had frequent falls, resulting in a ED visit on 5/17-she was evaluated and underwent numerous x-rays that were negative for fracture.  She was evaluated by PT and SNF was recommended, however family refused and she went back to memory care unit.  Per husband-this afternoon she was walking to the bathroom, when she looked behind-lost her balance and fell.  She presented to the ED-and upon further evaluation with numerous imaging studies she was found to have a left hip fracture.  Per husband who witnessed the fall, there was no loss of consciousness.  ED-MD spoke with orthopedics-Dr. Kristopher Oppenheim requested transfer to [facility] so that patient could be evaluated by her primary orthopedic surgeon Dr. [name].  As noted above-no syncopal episode.  Husband did not notice any obvious chest pain or shortness of breath.  Patient has been in her usual state of health-without any other ongoing issues.  At baseline-very pleasantly confused but able to ambulate independently.  Review of Systems: unable to review all systems due  to the inability of the patient to answer questions. Past Medical History:  Diagnosis Date   [name] 01/17/2019   Anxiety    Arthritis    Frequent UTI    over the last year   Hypertension    Inguinal hernia    right   Shingles    Urinary frequency    over the year   Urinary urgency    Wears glasses    Past Surgical History:  Procedure Laterality Date   BUNIONECTOMY     both feet   EYE SURGERY Left 04-2013   cataract Left eye   INGUINAL HERNIA REPAIR Right 09/16/2019   Procedure: LAPAROSCOPIC RIGHT INGUINAL HERNIA REPAIR WITH MESH;  Surgeon: Axel Filler, MD;  Location: [facility] OR;  Service: General;  Laterality: Right;   LUMBAR LAMINECTOMY N/A 10/14/2013   Procedure: MICRODISCECTOMY LUMBAR LAMINECTOMY/L4-5 Decompression, Excision Extradural Intraspinal Cyst;  Surgeon: Eldred Manges, MD;  Location: [facility] OR;  Service: Orthopedics;  Laterality: N/A;  L4-5 Decompression, Excision Extradural Intraspinal Cyst   Social History:  reports that she has quit smoking. Her smoking use included cigarettes. She has never used smokeless tobacco. She reports current alcohol use of about 3.0 standard drinks of alcohol per week. She reports that she does not use drugs.  Allergies  Allergen Reactions   Contrast Media [Iodinated Contrast Media]     unknown    Family History  Problem Relation Age of Onset   High blood pressure Mother    Diabetes Sister    High blood pressure Sister    Breast cancer Sister  Colon cancer Neg Hx    Esophageal cancer Neg Hx     Prior to Admission medications   Medication Sig Start Date End Date Taking? Authorizing Provider  Cholecalciferol (VITAMIN D3) 50 MCG (2000 UT) TABS Take 2,000 Units by mouth daily.    [provider]  donepezil (ARICEPT) 10 MG tablet Take 1 tablet (10 mg total) by mouth at bedtime. Patient not taking: Reported on 09/07/2022 03/02/22   Glean Salvo, NP  donepezil (ARICEPT) 5 MG tablet TAKE ONE TABLET BY MOUTH AT  bedtime 09/19/22   Glean Salvo, NP  ibuprofen (ADVIL) 200 MG tablet Take 400 mg by mouth every 8 (eight) hours as needed (for pain.). Patient not taking: Reported on 09/07/2022    [provider]  memantine (NAMENDA) 10 MG tablet Take 1 tablet (10 mg total) by mouth 2 (two) times daily. 03/02/22   Glean Salvo, NP  polyethylene glycol (MIRALAX / GLYCOLAX) 17 g packet daily. 1 packet mixed with 8 ounces of fluid    [provider]  rosuvastatin (CRESTOR) 5 MG tablet Take 5 mg by mouth at bedtime. 02/01/20   [provider]    Physical Exam: Vitals:   04/17/23 1559 04/17/23 1600 04/17/23 1930 04/17/23 2000  BP:   (!) 146/96   Pulse: 62  (!) 58   Resp:   18   Temp:    98.5 F (36.9 C)  SpO2: 100%  95%   Weight:  59 kg    Height:  5\' 6"  (1.676 m)     Gen Exam:Alert awake-not in any distress HEENT:atraumatic, normocephalic Chest: B/L clear to auscultation anteriorly CVS:S1S2 regular Abdomen:soft non tender, non distended Extremities:no edema Neurology: Non focal Skin: no rash  Data Reviewed:     Latest Ref Rng & Units 04/17/2023    5:40 PM 04/14/2023    9:05 AM 08/07/2022    2:48 PM  CBC  WBC 4.0 - 10.5 K/uL 7.7  8.1  7.5   Hemoglobin 12.0 - 15.0 g/dL 40.9  81.1  91.4   Hematocrit 36.0 - 46.0 % 38.8  40.6  41.1   Platelets 150 - 400 K/uL 166  195  211         Latest Ref Rng & Units 04/17/2023    5:40 PM 04/14/2023    9:05 AM 08/07/2022    2:48 PM  BMP  Glucose 70 - 99 mg/dL 782  956  91   BUN 8 - 23 mg/dL 18  20  13    Creatinine 0.44 - 1.00 mg/dL 2.13  0.86  5.78   Sodium 135 - 145 mmol/L 139  139  140   Potassium 3.5 - 5.1 mmol/L 3.5  3.7  3.7   Chloride 98 - 111 mmol/L 103  105  104   CO2 22 - 32 mmol/L 28  27  27    Calcium 8.9 - 10.3 mg/dL 9.0  9.2  9.8      Assessment and Plan: Left hip fracture Following a mechanical fall ED-MD spoke with orthopedics-requesting transfer to [facility] for orthopedic eval by her primary surgeon-Dr.  [name] Keep n.p.o. postmidnight Long discussion with spouse at bedside-understands patient will be at moderate risk for any sort of surgical procedure-understands alternative of not pursuing any surgical intervention-he accepts all risks and wants to proceed with surgical correction.  Patient normally is independently ambulatory-and apparently ambulates in her memory care unit without any major shortness of breath.  Recurrent falls Multiple falls over the  past several days-recently evaluated by PT on 5/17 when she presented to the ED-SNF was recommended-however family refused. PT/OT eval after surgical correction-suspect will require SNF TOC evaluation  Dementia At risk for delirium-maintain delirium precautions Add as needed Haldol-if she does require numerous Haldol injections overnight-she will likely require scheduled Seroquel. Resume Namenda/Aricept  HLD Statin  Constipation Significant stool burden seen on CT abdomen-although impacted on CT-no clinical evidence of stool impaction-she seems rather comfortable Fleet enema x 1 Scheduled MiraLAX/senna Reassess 5/21   Advance Care Planning:   Code Status: DNR   Consults: Ortho  Family Communication: Spouse at bedside  Severity of Illness: The appropriate patient status for this patient is INPATIENT. Inpatient status is judged to be reasonable and necessary in order to provide the required intensity of service to ensure the patient's safety. The patient's presenting symptoms, physical exam findings, and initial radiographic and laboratory data in the context of their chronic comorbidities is felt to place them at high risk for further clinical deterioration. Furthermore, it is not anticipated that the patient will be medically stable for discharge from the hospital within 2 midnights of admission.   * I certify that at the point of admission it is my clinical judgment that the patient will require inpatient hospital care spanning  beyond 2 midnights from the point of admission due to high intensity of service, high risk for further deterioration and high frequency of surveillance required.*  Author: Jeoffrey Massed, MD 04/17/2023 9:34 PM  For on call review [email or web address].

## 2023-04-17 NOTE — ED Triage Notes (Signed)
Pt BIBA from [place]. Pt has L leg fx confirmed by x-ray this AM per EMS. Pt fell yesterday and the day before.  Hx dementia- per EMS pt is at baseline. Confused, able to follow some commands.   +2 bilat pedal pulses felt

## 2023-04-17 NOTE — ED Notes (Signed)
FLEET Enema provided, along with noted meds, will monitor for bowel movement.

## 2023-04-18 ENCOUNTER — Encounter (HOSPITAL_COMMUNITY): Payer: Self-pay | Admitting: Internal Medicine

## 2023-04-18 DIAGNOSIS — Z7189 Other specified counseling: Secondary | ICD-10-CM | POA: Diagnosis not present

## 2023-04-18 DIAGNOSIS — S72002A Fracture of unspecified part of neck of left femur, initial encounter for closed fracture: Secondary | ICD-10-CM | POA: Diagnosis not present

## 2023-04-18 DIAGNOSIS — F0390 Unspecified dementia without behavioral disturbance: Secondary | ICD-10-CM

## 2023-04-18 DIAGNOSIS — Z515 Encounter for palliative care: Secondary | ICD-10-CM

## 2023-04-18 DIAGNOSIS — W19XXXA Unspecified fall, initial encounter: Secondary | ICD-10-CM

## 2023-04-18 LAB — BASIC METABOLIC PANEL
Anion gap: 6 (ref 5–15)
BUN: 11 mg/dL (ref 8–23)
CO2: 22 mmol/L (ref 22–32)
Calcium: 7.5 mg/dL — ABNORMAL LOW (ref 8.9–10.3)
Chloride: 109 mmol/L (ref 98–111)
Creatinine, Ser: 0.44 mg/dL (ref 0.44–1.00)
GFR, Estimated: >60 mL/min (ref >60)
Glucose, Bld: 91 mg/dL (ref 70–99)
Potassium: 3.1 mmol/L — ABNORMAL LOW (ref 3.5–5.1)
Sodium: 137 mmol/L (ref 135–145)

## 2023-04-18 LAB — RETICULOCYTES
Immature Retic Fract: 12.2 % (ref 2.3–15.9)
RBC.: 3.89 MIL/uL (ref 3.87–5.11)
Retic Count, Absolute: 62.2 10*3/uL (ref 19.0–186.0)
Retic Ct Pct: 1.6 % (ref 0.4–3.1)

## 2023-04-18 LAB — IRON AND TIBC
Iron: 23 ug/dL — ABNORMAL LOW (ref 28–170)
Saturation Ratios: 9 % — ABNORMAL LOW (ref 10.4–31.8)
TIBC: 266 ug/dL (ref 250–450)
UIBC: 243 ug/dL

## 2023-04-18 LAB — CBC
HCT: 33.3 % — ABNORMAL LOW (ref 36.0–46.0)
Hemoglobin: 10.9 g/dL — ABNORMAL LOW (ref 12.0–15.0)
MCH: 31.3 pg (ref 26.0–34.0)
MCHC: 32.7 g/dL (ref 30.0–36.0)
MCV: 95.7 fL (ref 80.0–100.0)
Platelets: 136 10*3/uL — ABNORMAL LOW (ref 150–400)
RBC: 3.48 MIL/uL — ABNORMAL LOW (ref 3.87–5.11)
RDW: 14.6 % (ref 11.5–15.5)
WBC: 6.3 10*3/uL (ref 4.0–10.5)
nRBC: 0 % (ref 0.0–0.2)

## 2023-04-18 LAB — TYPE AND SCREEN
ABO/RH(D): A POS
Antibody Screen: NEGATIVE

## 2023-04-18 LAB — FOLATE: Folate: 19.1 ng/mL (ref >5.9)

## 2023-04-18 LAB — VITAMIN B12: Vitamin B-12: 194 pg/mL (ref 180–914)

## 2023-04-18 LAB — FERRITIN: Ferritin: 232 ng/mL (ref 11–307)

## 2023-04-18 MED ORDER — POLYETHYLENE GLYCOL 3350 17 G PO PACK
17.0000 g | PACK | Freq: Two times a day (BID) | ORAL | Status: AC
  Administered 2023-04-20: 17 g via ORAL
  Filled 2023-04-18 (×2): qty 1

## 2023-04-18 MED ORDER — QUETIAPINE FUMARATE 25 MG PO TABS
25.0000 mg | ORAL_TABLET | Freq: Every evening | ORAL | Status: DC | PRN
  Administered 2023-04-18 – 2023-04-20 (×2): 25 mg via ORAL
  Filled 2023-04-18 (×2): qty 1

## 2023-04-18 MED ORDER — MELATONIN 3 MG PO TABS
3.0000 mg | ORAL_TABLET | Freq: Every day | ORAL | Status: DC
  Administered 2023-04-18 – 2023-04-25 (×8): 3 mg via ORAL
  Filled 2023-04-18 (×8): qty 1

## 2023-04-18 MED ORDER — CHLORHEXIDINE GLUCONATE 4 % EX SOLN: 60.0000 mL | Freq: Once | CUTANEOUS | Status: DC

## 2023-04-18 MED ORDER — SORBITOL 70 % SOLN
960.0000 mL | TOPICAL_OIL | Freq: Once | ORAL | Status: DC
  Filled 2023-04-18: qty 240

## 2023-04-18 MED ORDER — CEFAZOLIN SODIUM-DEXTROSE 2-4 GM/100ML-% IV SOLN
2.0000 g | INTRAVENOUS | Status: AC
  Administered 2023-04-19: 2 g via INTRAVENOUS
  Filled 2023-04-18: qty 100

## 2023-04-18 MED ORDER — TRANEXAMIC ACID-NACL 1000-0.7 MG/100ML-% IV SOLN
1000.0000 mg | INTRAVENOUS | Status: AC
  Administered 2023-04-19: 1000 mg via INTRAVENOUS
  Filled 2023-04-18: qty 100

## 2023-04-18 MED ORDER — POTASSIUM CHLORIDE 20 MEQ PO PACK
40.0000 meq | PACK | Freq: Two times a day (BID) | ORAL | Status: AC
  Administered 2023-04-18 (×2): 40 meq via ORAL
  Filled 2023-04-18 (×2): qty 2

## 2023-04-18 MED ORDER — POVIDONE-IODINE 10 % EX SWAB
2.0000 | Freq: Once | CUTANEOUS | Status: AC
  Administered 2023-04-19: 2 via TOPICAL

## 2023-04-18 MED ORDER — MIRTAZAPINE 15 MG PO TABS
7.5000 mg | ORAL_TABLET | Freq: Every day | ORAL | Status: DC
  Administered 2023-04-18 – 2023-04-25 (×8): 7.5 mg via ORAL
  Filled 2023-04-18 (×8): qty 1

## 2023-04-18 MED ORDER — MAGNESIUM OXIDE -MG SUPPLEMENT 400 (240 MG) MG PO TABS
400.0000 mg | ORAL_TABLET | Freq: Two times a day (BID) | ORAL | Status: AC
  Administered 2023-04-18 (×2): 400 mg via ORAL
  Filled 2023-04-18 (×2): qty 1

## 2023-04-18 MED ORDER — POLYETHYLENE GLYCOL 3350 17 G PO PACK
17.0000 g | PACK | Freq: Two times a day (BID) | ORAL | Status: DC
  Administered 2023-04-20 – 2023-04-26 (×12): 17 g via ORAL
  Filled 2023-04-18 (×13): qty 1

## 2023-04-18 MED ORDER — SERTRALINE HCL 25 MG PO TABS
25.0000 mg | ORAL_TABLET | Freq: Every day | ORAL | Status: DC
  Administered 2023-04-18 – 2023-04-26 (×8): 25 mg via ORAL
  Filled 2023-04-18 (×9): qty 1

## 2023-04-18 NOTE — Progress Notes (Signed)
Patient ID: Alicia [name], female   DOB: May 23, 1936, 87 y.o.   MRN: [id number] Posted for surgery hip hemiarthroplasty Wed afternoon. Patient to be transferred to [facility].

## 2023-04-18 NOTE — Progress Notes (Addendum)
[facility] PROGRESS NOTE    Progress Note  Alicia [name]  NWG:[id number] DOB: 1936-09-13 DOA: 04/17/2023 PCP: [name], MD     Brief Narrative:   Alicia [name] is an 87 y.o. female past medical history significant for advanced dementia who presents from memory care unit after mechanical fall was found to have a left femur fracture  Assessment/Plan:   Left hip fracture [place] to mechanical fall: ED physician spoke with Dr. [name] orthopedic surgery recommended n.p.o. after midnight for possible surgical intervention, she is scheduled for surgical intervention on 04/19/2023 in the afternoon at [facility], will need to be transferred.   Previous physician spoke with the family , that she is moderate risk for cardiopulmonary disease.  And would like to proceed with surgical intervention.  Recurrent falls: Presented to the ED on 04/14/2023 evaluated by PT recommended SNF. I think we need to get palliative care involved.  Hypokalemia: Replete orally recheck in the morning. Critical potassium greater than 4 magnesium greater than 2.  Advanced dementia: She is at high risk of aspiration and acute confusional state/delirium. Resume Namenda and Aricept. Use melatonin at night, Seroquel as needed  Hyperlipidemia: Continue statins when she is able to take orals.  Constipation: CT scan of the abdomen pelvis showed evidence stool implantation rather uncomfortable Fleet enema stent 1. Will need to give her smog enema.  Normocytic anemia: Mild drop in hemoglobin likely hemodilution all. No signs of bleeding. Check CBC postsurgical intervention. Type and screen, check an anemia panel.  Mild thrombocytopenia: Recheck in the morning.  Severe protein caloric malnutrition: Noted.  Goals of care: I think it would be a good idea to family will meet with palliative care and address goals of care not only DNR/DNI, but also her prognosis. During current falls  put her at high risk of another event and if she is bedbound she is quickly going to decompensate in the future.   DVT prophylaxis: lovenox Family Communication:none Status is: Inpatient Remains inpatient appropriate because: Left hip fracture due to mechanical fall    Code Status:     Code Status Orders  (From admission, onward)           Start     Ordered   04/17/23 2130  Do not attempt resuscitation (DNR)  Continuous       Question Answer Comment  If patient has no pulse and is not breathing Do Not Attempt Resuscitation   If patient has a pulse and/or is breathing: Medical Treatment Goals LIMITED ADDITIONAL INTERVENTIONS: Use medication/IV fluids and cardiac monitoring as indicated; Do not use intubation or mechanical ventilation (DNI), also provide comfort medications.  Transfer to Progressive/Stepdown as indicated, avoid Intensive Care.   Consent: Discussion documented in EHR or advanced directives reviewed      04/17/23 2131           Code Status History     Date Active Date Inactive Code Status Order ID Comments User Context   10/14/2013 1854 10/15/2013 1812 Full Code [id number]  Wende Neighbors, PA-C Inpatient         IV Access:   Peripheral IV   Procedures and diagnostic studies:   CT Hip Left Wo Contrast  Result Date: 04/17/2023 CLINICAL DATA:  Possible left hip fracture.  Hip trauma EXAM: CT OF THE LEFT HIP WITHOUT CONTRAST TECHNIQUE: Multidetector CT imaging of the left hip was performed according to the standard protocol. Multiplanar CT image reconstructions were also generated. RADIATION DOSE REDUCTION: This  exam was performed according to the departmental dose-optimization program which includes automated exposure control, adjustment of the mA and/or kV according to patient size and/or use of iterative reconstruction technique. COMPARISON:  Radiographs 04/17/2023 FINDINGS: Bones/Joint/Cartilage There is cortical disruption of the anterior cortex of  the femoral neck near the head neck junction (series 3/image 81 and 8/64). Irregular sclerosis at the head neck junction. Findings are suspicious for nondisplaced fracture. Degenerative changes about the left hip, SI joint, and visualized lumbar spine. Ligaments Suboptimally assessed by CT. Muscles and Tendons Unremarkable CT appearance. Soft tissues Large stool ball in the rectum with rectal mild wall thickening. Mild subcutaneous edema in the left gluteal soft tissues. IMPRESSION: 1. Findings suspicious for nondisplaced subcapital left femoral neck fracture. Consider MRI for confirmation. 2. Large stool ball in the rectum with mild rectal wall thickening suggesting impaction and stercoral colitis. Electronically Signed   By: Minerva Fester M.D.   On: 04/17/2023 18:58   DG Tibia/Fibula Left  Result Date: 04/17/2023 CLINICAL DATA:  Fall yesterday. EXAM: LEFT TIBIA AND FIBULA - 2 VIEW COMPARISON:  None Available. FINDINGS: There is no evidence of fracture or other focal bone lesions. Soft tissues are unremarkable. IMPRESSION: Negative. Electronically Signed   By: Lupita Raider M.D.   On: 04/17/2023 18:54   DG Femur 1V Left  Result Date: 04/17/2023 CLINICAL DATA:  Fall yesterday. EXAM: LEFT FEMUR 1 VIEW COMPARISON:  None Available. FINDINGS: Minimally displaced subcapital fracture of proximal left femur cannot be excluded. Correlation with CT findings of same day is recommended. No abnormality seen involving middle and distal portions of the left femur. IMPRESSION: Findings concerning for possible minimally displaced subcapital fracture of proximal left femur. Correlation with CT findings is recommended. Electronically Signed   By: Lupita Raider M.D.   On: 04/17/2023 18:52   DG Knee Complete 4 Views Left  Result Date: 04/17/2023 CLINICAL DATA:  Larey Seat EXAM: LEFT KNEE - COMPLETE 4+ VIEW COMPARISON:  None Available. FINDINGS: Frontal, bilateral oblique, and cross-table lateral views of the left knee are  obtained. Diffuse osteopenia. There are no acute displaced fractures. Mild 3 compartmental osteoarthritis. Trace joint effusion. Soft tissues are unremarkable. IMPRESSION: 1. No acute fracture. 2. Mild 3 compartmental osteoarthritis and likely reactive trace suprapatellar joint effusion. 3. Diffuse osteopenia. Electronically Signed   By: Sharlet Salina M.D.   On: 04/17/2023 17:45   DG Hips Bilat W or Wo Pelvis 3-4 Views  Result Date: 04/17/2023 CLINICAL DATA:  Larey Seat yesterday, confusion EXAM: DG HIP (WITH OR WITHOUT PELVIS) 3-4V BILAT COMPARISON:  03/26/2020 FINDINGS: Frontal view of the pelvis, frontal and frogleg lateral views of the right hip, and frontal and cross-table lateral views of the left hip are obtained on 5 images. There is severe bilateral hip osteoarthritis. Subtle sclerosis seen in the subcapital region of the left femoral neck likely reflects a collar of osteophytes rather than nondisplaced fracture. Underlying trabecula appear intact. If nondisplaced fracture is a concern, CT could be considered. No other signs of acute displaced fracture. Remainder of the bony pelvis is unremarkable. Large amount of stool within the rectal vault consistent with fecal impaction. IMPRESSION: 1. Severe bilateral hip osteoarthritis. 2. Left femoral neck subcapital sclerosis favors abundant osteophytes over nondisplaced fracture. The underlying trabecula appear intact. If there is concern for nondisplaced left hip fracture a CT could be performed. 3. Otherwise no acute bony abnormalities. 4. Large amount of retained stool in the rectal vault concerning for fecal impaction. Electronically Signed  By: Sharlet Salina M.D.   On: 04/17/2023 17:45   CT Head Wo Contrast  Result Date: 04/17/2023 CLINICAL DATA:  Dementia, fell yesterday EXAM: CT HEAD WITHOUT CONTRAST TECHNIQUE: Contiguous axial images were obtained from the base of the skull through the vertex without intravenous contrast. RADIATION DOSE REDUCTION:  This exam was performed according to the departmental dose-optimization program which includes automated exposure control, adjustment of the mA and/or kV according to patient size and/or use of iterative reconstruction technique. COMPARISON:  04/14/2023 FINDINGS: Brain: Stable chronic small-vessel ischemic changes throughout the periventricular white matter. No evidence of acute infarct or hemorrhage. Lateral ventricles and midline structures are stable. No acute extra-axial fluid collections. No mass effect. Vascular: Stable atherosclerosis.  No hyperdense vessel. Skull: Normal. Negative for fracture or focal lesion. Sinuses/Orbits: No acute finding. Other: None. IMPRESSION: 1. Stable head CT, no acute intracranial process. Electronically Signed   By: Sharlet Salina M.D.   On: 04/17/2023 17:15     Medical Consultants:   None.   Subjective:    Alicia [name]   Objective:    Vitals:   04/18/23 0430 04/18/23 0600 04/18/23 0649 04/18/23 0727  BP: (!) 122/97 110/78  (!) 127/106  Pulse: (!) 59 (!) 52  (!) 56  Resp: 18 16  17   Temp:   97.8 F (36.6 C) 97.8 F (36.6 C)  TempSrc:   Oral Oral  SpO2: 97% 99%  98%  Weight:      Height:       SpO2: 98 %  No intake or output data in the 24 hours ending 04/18/23 0736 Filed Weights   04/17/23 1600  Weight: 59 kg    Exam: General exam: In no acute distress. Respiratory system: Good air movement and clear to auscultation. Cardiovascular system: S1 & S2 heard, RRR. No JVD, murmurs, rubs, gallops or clicks.  Gastrointestinal system: Abdomen is nondistended, soft and nontender.  Central nervous system: Alert and oriented. No focal neurological deficits. Extremities: No pedal edema. Skin: No rashes, lesions or ulcers Psychiatry: Judgement and insight appear normal. Mood & affect appropriate.    Data Reviewed:    Labs: Basic Metabolic Panel: Recent Labs  Lab 04/14/23 0905 04/17/23 1740 04/18/23 0526  NA 139 139 137  K 3.7 3.5  3.1*  CL 105 103 109  CO2 27 28 22   GLUCOSE 105* 100* 91  BUN 20 18 11   CREATININE 0.80 0.59 0.44  CALCIUM 9.2 9.0 7.5*   GFR Estimated Creatinine Clearance: 48.8 mL/min (by C-G formula based on SCr of 0.44 mg/dL). Liver Function Tests: Recent Labs  Lab 04/17/23 1740  AST 18  ALT 15  ALKPHOS 50  BILITOT 1.7*  PROT 7.2  ALBUMIN 3.7   No results for input(s): "LIPASE", "AMYLASE" in the last 168 hours. No results for input(s): "AMMONIA" in the last 168 hours. Coagulation profile No results for input(s): "INR", "PROTIME" in the last 168 hours. COVID-19 Labs  No results for input(s): "DDIMER", "FERRITIN", "LDH", "CRP" in the last 72 hours.  Lab Results  Component Value Date   SARSCOV2NAA NEGATIVE 11/06/2021   SARSCOV2NAA NOT DETECTED 09/12/2019   SARSCOV2NAA NOT DETECTED 08/27/2019    CBC: Recent Labs  Lab 04/14/23 0905 04/17/23 1740 04/18/23 0526  WBC 8.1 7.7 6.3  NEUTROABS 6.6 5.7  --   HGB 13.3 12.6 10.9*  HCT 40.6 38.8 33.3*  MCV 95.5 94.9 95.7  PLT 195 166 136*   Cardiac Enzymes: No results for input(s): "CKTOTAL", "CKMB", "CKMBINDEX", "  TROPONINI" in the last 168 hours. BNP (last 3 results) No results for input(s): "PROBNP" in the last 8760 hours. CBG: No results for input(s): "GLUCAP" in the last 168 hours. D-Dimer: No results for input(s): "DDIMER" in the last 72 hours. Hgb A1c: No results for input(s): "HGBA1C" in the last 72 hours. Lipid Profile: No results for input(s): "CHOL", "HDL", "LDLCALC", "TRIG", "CHOLHDL", "LDLDIRECT" in the last 72 hours. Thyroid function studies: No results for input(s): "TSH", "T4TOTAL", "T3FREE", "THYROIDAB" in the last 72 hours.  Invalid input(s): "FREET3" Anemia work up: No results for input(s): "VITAMINB12", "FOLATE", "FERRITIN", "TIBC", "IRON", "RETICCTPCT" in the last 72 hours. Sepsis Labs: Recent Labs  Lab 04/14/23 0905 04/17/23 1740 04/18/23 0526  WBC 8.1 7.7 6.3   Microbiology No results found for  this or any previous visit (from the past 240 hour(s)).   Medications:    cholecalciferol  2,000 Units Oral Daily   donepezil  5 mg Oral Daily   memantine  10 mg Oral BID   polyethylene glycol  17 g Oral BID   rosuvastatin  5 mg Oral Daily   senna-docusate  2 tablet Oral QHS   Continuous Infusions:  sodium chloride 50 mL/hr at 04/18/23 0650      LOS: 1 day   Marinda Elk  [facility]  04/18/2023, 7:36 AM

## 2023-04-18 NOTE — TOC Initial Note (Signed)
Transition of Care [facility]) - Initial/Assessment Note    Patient Details  Name: Alicia [name] MRN: [id number] Date of Birth: 01-20-1936  Transition of Care [facility]) CM/SW Contact:    Oletta Cohn, RN Phone Number: 04/18/2023, 8:53 AM  Clinical Narrative:                 Fall resulting in hip fracture. Patient lives in [place] memory care unit and her husband lives there in independent living.  To be transferred to [facility] for hip surgery 5/22.     Barriers to Discharge: Continued Medical Work up   Patient Goals and CMS Choice            Expected Discharge Plan and Services                                              Prior Living Arrangements/Services                       Activities of Daily Living      Permission Sought/Granted                  Emotional Assessment              Admission diagnosis:  Hip fracture [facility]) [S72.009A] Patient Active Problem List   Diagnosis Date Noted   Hip fracture (HCC) 04/17/2023   Syncope and collapse 12/10/2021   Orthostatic hypotension 12/10/2021   [name] 01/17/2019   Spinal stenosis, lumbar region, with neurogenic claudication 10/14/2013    Class: Diagnosis of   PCP:  [name], MD Pharmacy:   [facility] - [place], [place] - [address] [place] [address] Phone: [phone] Fax: [phone]     Social Determinants of Health (SDOH) Social History: SDOH Screenings   Tobacco Use: Medium Risk (04/17/2023)   SDOH Interventions:     Readmission Risk Interventions     No data to display

## 2023-04-18 NOTE — H&P (View-Only) (Signed)
Reason for Consult:Left hip fx Referring Physician: Lynden Oxford Time called: 1105 Time at bedside: 1109   Alicia [name] is an 87 y.o. female.  HPI: Alicia [name] fell at the memory care unit where she resides several times over the last few weeks. This last time she looked behind her, lost her balance, and fell. She was brought to the ED at [facility] where workup showed a left hip fx and orthopedic surgery was consulted. She was transferred to [facility] for further care. She is demented and cannot contribute to history.  Past Medical History:  Diagnosis Date   [name] 01/17/2019   Anxiety    Arthritis    Frequent UTI    over the last year   Hypertension    Inguinal hernia    right   Shingles    Urinary frequency    over the year   Urinary urgency    Wears glasses     Past Surgical History:  Procedure Laterality Date   BUNIONECTOMY     both feet   EYE SURGERY Left 04-2013   cataract Left eye   INGUINAL HERNIA REPAIR Right 09/16/2019   Procedure: LAPAROSCOPIC RIGHT INGUINAL HERNIA REPAIR WITH MESH;  Surgeon: Axel Filler, MD;  Location: [facility] OR;  Service: General;  Laterality: Right;   LUMBAR LAMINECTOMY N/A 10/14/2013   Procedure: MICRODISCECTOMY LUMBAR LAMINECTOMY/L4-5 Decompression, Excision Extradural Intraspinal Cyst;  Surgeon: Eldred Manges, MD;  Location: [facility] OR;  Service: Orthopedics;  Laterality: N/A;  L4-5 Decompression, Excision Extradural Intraspinal Cyst    Family History  Problem Relation Age of Onset   High blood pressure Mother    Diabetes Sister    High blood pressure Sister    Breast cancer Sister    Colon cancer Neg Hx    Esophageal cancer Neg Hx     Social History:  reports that she has quit smoking. Her smoking use included cigarettes. She has never used smokeless tobacco. She reports current alcohol use of about 3.0 standard drinks of alcohol per week. She reports that she does not use drugs.  Allergies:  Allergies  Allergen Reactions    Contrast Media [Iodinated Contrast Media] Other (See Comments)    Reaction not noted on MAR    Medications: I have reviewed the patient's current medications.  Results for orders placed or performed during the hospital encounter of 04/17/23 (from the past 48 hour(s))  CBC with Differential/Platelet     Status: None   Collection Time: 04/17/23  5:40 PM  Result Value Ref Range   WBC 7.7 4.0 - 10.5 K/uL   RBC 4.09 3.87 - 5.11 MIL/uL   Hemoglobin 12.6 12.0 - 15.0 g/dL   HCT 98.1 19.1 - 47.8 %   MCV 94.9 80.0 - 100.0 fL   MCH 30.8 26.0 - 34.0 pg   MCHC 32.5 30.0 - 36.0 g/dL   RDW 29.5 62.1 - 30.8 %   Platelets 166 150 - 400 K/uL   nRBC 0.0 0.0 - 0.2 %   Neutrophils Relative % 74 %   Neutro Abs 5.7 1.7 - 7.7 K/uL   Lymphocytes Relative 15 %   Lymphs Abs 1.1 0.7 - 4.0 K/uL   Monocytes Relative 9 %   Monocytes Absolute 0.7 0.1 - 1.0 K/uL   Eosinophils Relative 2 %   Eosinophils Absolute 0.1 0.0 - 0.5 K/uL   Basophils Relative 0 %   Basophils Absolute 0.0 0.0 - 0.1 K/uL   Immature Granulocytes 0 %  Abs Immature Granulocytes 0.02 0.00 - 0.07 K/uL    Comment: Performed at [facility], [address] [place], [place] [address]  Comprehensive metabolic panel     Status: Abnormal   Collection Time: 04/17/23  5:40 PM  Result Value Ref Range   Sodium 139 135 - 145 mmol/L   Potassium 3.5 3.5 - 5.1 mmol/L   Chloride 103 98 - 111 mmol/L   CO2 28 22 - 32 mmol/L   Glucose, Bld 100 (H) 70 - 99 mg/dL    Comment: Glucose reference range applies only to samples taken after fasting for at least 8 hours.   BUN 18 8 - 23 mg/dL   Creatinine, Ser 5.62 0.44 - 1.00 mg/dL   Calcium 9.0 8.9 - 13.0 mg/dL   Total Protein 7.2 6.5 - 8.1 g/dL   Albumin 3.7 3.5 - 5.0 g/dL   AST 18 15 - 41 U/L   ALT 15 0 - 44 U/L   Alkaline Phosphatase 50 38 - 126 U/L   Total Bilirubin 1.7 (H) 0.3 - 1.2 mg/dL   GFR, Estimated >86 >57 mL/min    Comment: (NOTE) Calculated using the CKD-EPI Creatinine  Equation (2021)    Anion gap 8 5 - 15    Comment: Performed at [facility], [address], [place], [place] [address]  ABO/Rh     Status: None   Collection Time: 04/17/23  6:07 PM  Result Value Ref Range   ABO/RH(D)      A POS Performed at [facility], [address] [place], [place] [address]   Type and screen [facility]     Status: None   Collection Time: 04/17/23  9:46 PM  Result Value Ref Range   ABO/RH(D) A POS    Antibody Screen NEG    Sample Expiration      04/20/2023,2359 Performed at [facility], [address] [place], [place] [address]   CBC     Status: Abnormal   Collection Time: 04/18/23  5:26 AM  Result Value Ref Range   WBC 6.3 4.0 - 10.5 K/uL   RBC 3.48 (L) 3.87 - 5.11 MIL/uL   Hemoglobin 10.9 (L) 12.0 - 15.0 g/dL   HCT 40.1 (L) 02.7 - 25.3 %   MCV 95.7 80.0 - 100.0 fL   MCH 31.3 26.0 - 34.0 pg   MCHC 32.7 30.0 - 36.0 g/dL   RDW 66.4 40.3 - 47.4 %   Platelets 136 (L) 150 - 400 K/uL   nRBC 0.0 0.0 - 0.2 %    Comment: Performed at [facility], [address], [place], [place] [address]  Basic metabolic panel     Status: Abnormal   Collection Time: 04/18/23  5:26 AM  Result Value Ref Range   Sodium 137 135 - 145 mmol/L   Potassium 3.1 (L) 3.5 - 5.1 mmol/L   Chloride 109 98 - 111 mmol/L   CO2 22 22 - 32 mmol/L   Glucose, Bld 91 70 - 99 mg/dL    Comment: Glucose reference range applies only to samples taken after fasting for at least 8 hours.   BUN 11 8 - 23 mg/dL   Creatinine, Ser 3.87 0.44 - 1.00 mg/dL   Calcium 7.5 (L) 8.9 - 10.3 mg/dL   GFR, Estimated >56 >43 mL/min    Comment: (NOTE) Calculated using the CKD-EPI Creatinine Equation (2021)    Anion gap 6 5 - 15  Comment: Performed at [facility], [address] [place], [place] [address]    CT Hip Left Wo Contrast  Result Date: 04/17/2023 CLINICAL DATA:  Possible left hip  fracture.  Hip trauma EXAM: CT OF THE LEFT HIP WITHOUT CONTRAST TECHNIQUE: Multidetector CT imaging of the left hip was performed according to the standard protocol. Multiplanar CT image reconstructions were also generated. RADIATION DOSE REDUCTION: This exam was performed according to the departmental dose-optimization program which includes automated exposure control, adjustment of the mA and/or kV according to patient size and/or use of iterative reconstruction technique. COMPARISON:  Radiographs 04/17/2023 FINDINGS: Bones/Joint/Cartilage There is cortical disruption of the anterior cortex of the femoral neck near the head neck junction (series 3/image 81 and 8/64). Irregular sclerosis at the head neck junction. Findings are suspicious for nondisplaced fracture. Degenerative changes about the left hip, SI joint, and visualized lumbar spine. Ligaments Suboptimally assessed by CT. Muscles and Tendons Unremarkable CT appearance. Soft tissues Large stool ball in the rectum with rectal mild wall thickening. Mild subcutaneous edema in the left gluteal soft tissues. IMPRESSION: 1. Findings suspicious for nondisplaced subcapital left femoral neck fracture. Consider MRI for confirmation. 2. Large stool ball in the rectum with mild rectal wall thickening suggesting impaction and stercoral colitis. Electronically Signed   By: Minerva Fester M.D.   On: 04/17/2023 18:58   DG Tibia/Fibula Left  Result Date: 04/17/2023 CLINICAL DATA:  Fall yesterday. EXAM: LEFT TIBIA AND FIBULA - 2 VIEW COMPARISON:  None Available. FINDINGS: There is no evidence of fracture or other focal bone lesions. Soft tissues are unremarkable. IMPRESSION: Negative. Electronically Signed   By: Lupita Raider M.D.   On: 04/17/2023 18:54   DG Femur 1V Left  Result Date: 04/17/2023 CLINICAL DATA:  Fall yesterday. EXAM: LEFT FEMUR 1 VIEW COMPARISON:  None Available. FINDINGS: Minimally displaced subcapital fracture of proximal left femur cannot be  excluded. Correlation with CT findings of same day is recommended. No abnormality seen involving middle and distal portions of the left femur. IMPRESSION: Findings concerning for possible minimally displaced subcapital fracture of proximal left femur. Correlation with CT findings is recommended. Electronically Signed   By: Lupita Raider M.D.   On: 04/17/2023 18:52   DG Knee Complete 4 Views Left  Result Date: 04/17/2023 CLINICAL DATA:  Larey Seat EXAM: LEFT KNEE - COMPLETE 4+ VIEW COMPARISON:  None Available. FINDINGS: Frontal, bilateral oblique, and cross-table lateral views of the left knee are obtained. Diffuse osteopenia. There are no acute displaced fractures. Mild 3 compartmental osteoarthritis. Trace joint effusion. Soft tissues are unremarkable. IMPRESSION: 1. No acute fracture. 2. Mild 3 compartmental osteoarthritis and likely reactive trace suprapatellar joint effusion. 3. Diffuse osteopenia. Electronically Signed   By: Sharlet Salina M.D.   On: 04/17/2023 17:45   DG Hips Bilat W or Wo Pelvis 3-4 Views  Result Date: 04/17/2023 CLINICAL DATA:  Larey Seat yesterday, confusion EXAM: DG HIP (WITH OR WITHOUT PELVIS) 3-4V BILAT COMPARISON:  03/26/2020 FINDINGS: Frontal view of the pelvis, frontal and frogleg lateral views of the right hip, and frontal and cross-table lateral views of the left hip are obtained on 5 images. There is severe bilateral hip osteoarthritis. Subtle sclerosis seen in the subcapital region of the left femoral neck likely reflects a collar of osteophytes rather than nondisplaced fracture. Underlying trabecula appear intact. If nondisplaced fracture is a concern, CT could be considered. No other signs of acute displaced fracture. Remainder of the bony pelvis is unremarkable. Large amount of  stool within the rectal vault consistent with fecal impaction. IMPRESSION: 1. Severe bilateral hip osteoarthritis. 2. Left femoral neck subcapital sclerosis favors abundant osteophytes over nondisplaced  fracture. The underlying trabecula appear intact. If there is concern for nondisplaced left hip fracture a CT could be performed. 3. Otherwise no acute bony abnormalities. 4. Large amount of retained stool in the rectal vault concerning for fecal impaction. Electronically Signed   By: Sharlet Salina M.D.   On: 04/17/2023 17:45   CT Head Wo Contrast  Result Date: 04/17/2023 CLINICAL DATA:  Dementia, fell yesterday EXAM: CT HEAD WITHOUT CONTRAST TECHNIQUE: Contiguous axial images were obtained from the base of the skull through the vertex without intravenous contrast. RADIATION DOSE REDUCTION: This exam was performed according to the departmental dose-optimization program which includes automated exposure control, adjustment of the mA and/or kV according to patient size and/or use of iterative reconstruction technique. COMPARISON:  04/14/2023 FINDINGS: Brain: Stable chronic small-vessel ischemic changes throughout the periventricular white matter. No evidence of acute infarct or hemorrhage. Lateral ventricles and midline structures are stable. No acute extra-axial fluid collections. No mass effect. Vascular: Stable atherosclerosis.  No hyperdense vessel. Skull: Normal. Negative for fracture or focal lesion. Sinuses/Orbits: No acute finding. Other: None. IMPRESSION: 1. Stable head CT, no acute intracranial process. Electronically Signed   By: Sharlet Salina M.D.   On: 04/17/2023 17:15    Review of Systems  Unable to perform ROS: Dementia   Blood pressure 135/86, pulse (!) 51, temperature 97.8 F (36.6 C), temperature source Oral, resp. rate 16, height 5\' 6"  (1.676 m), weight 59 kg, SpO2 97 %. Physical Exam Constitutional:      General: She is not in acute distress.    Appearance: She is well-developed. She is not diaphoretic.  HENT:     Head: Normocephalic and atraumatic.  Eyes:     General: No scleral icterus.       Right eye: No discharge.        Left eye: No discharge.     Conjunctiva/sclera:  Conjunctivae normal.  Cardiovascular:     Rate and Rhythm: Normal rate and regular rhythm.  Pulmonary:     Effort: Pulmonary effort is normal. No respiratory distress.  Musculoskeletal:     Cervical back: Normal range of motion.     Comments: LLE No traumatic wounds, ecchymosis, or rash  Mod TTP hip  No knee or ankle effusion  Knee stable to varus/ valgus and anterior/posterior stress  Sens DPN, SPN, TN could not assess  Motor EHL, ext, flex, evers grossly intact  DP 2+, PT 2+, No significant edema  Skin:    General: Skin is warm and dry.  Psychiatric:        Behavior: Behavior is agitated.     Assessment/Plan: Left hip fx -- Plan hip hemi tomorrow with Dr. [name]. Please keep NPO after MN. Dementia    Freeman Caldron, PA-C Orthopedic Surgery [phone] 04/18/2023, 11:19 AM

## 2023-04-18 NOTE — Progress Notes (Signed)
Patient is confused baseline. Husband was at bedside most of the day since she arrived. She needs a lot of coaching while taking medication and eating.

## 2023-04-18 NOTE — Progress Notes (Signed)
Transition of Care [facility]) - Emergency Department Mini Assessment   Patient Details  Name: Alicia [name] MRN: [id number] Date of Birth: May 06, 1936  Transition of Care [facility]) CM/SW Contact:    Oletta Cohn, RN Phone Number: 04/18/2023, 8:33 AM   Clinical Narrative: Fall resulting in hip fracture. Patient lives in [place] memory care unit and her husband lives there in independent living.  To be transferred to [facility] campus for hip surgery 5/22.  ED Mini Assessment: What brought you to the Emergency Department? : hip fracture after fall  Barriers to Discharge: Continued Medical Work up             Patient Surveyor, quantity 1: (P) [name] ([place])      ,     Contact Phone Number: (P) [phone]           Admission diagnosis:  Hip fracture (HCC) [S72.009A] Patient Active Problem List   Diagnosis Date Noted   Hip fracture (HCC) 04/17/2023   Syncope and collapse 12/10/2021   Orthostatic hypotension 12/10/2021   [name] 01/17/2019   Spinal stenosis, lumbar region, with neurogenic claudication 10/14/2013    Class: Diagnosis of   PCP:  [name], MD Pharmacy:   [facility] - [place], [place] - [address] [place] [address] Phone: [phone] Fax: [phone]

## 2023-04-18 NOTE — ED Notes (Signed)
[place] updated on patient status

## 2023-04-18 NOTE — Consult Note (Signed)
Reason for Consult:Left hip fx Referring Physician: Pranav Patel Time called: 1105 Time at bedside: 1109   Alicia [name] is an 87 y.o. female.  HPI: Cris fell at the memory care unit where she resides several times over the last few weeks. This last time she looked behind her, lost her balance, and fell. She was brought to the ED at [facility] where workup showed a left hip fx and orthopedic surgery was consulted. She was transferred to [facility] for further care. She is demented and cannot contribute to history.  Past Medical History:  Diagnosis Date   [name] 01/17/2019   Anxiety    Arthritis    Frequent UTI    over the last year   Hypertension    Inguinal hernia    right   Shingles    Urinary frequency    over the year   Urinary urgency    Wears glasses     Past Surgical History:  Procedure Laterality Date   BUNIONECTOMY     both feet   EYE SURGERY Left 04-2013   cataract Left eye   INGUINAL HERNIA REPAIR Right 09/16/2019   Procedure: LAPAROSCOPIC RIGHT INGUINAL HERNIA REPAIR WITH MESH;  Surgeon: Ramirez, Armando, MD;  Location: [facility] OR;  Service: General;  Laterality: Right;   LUMBAR LAMINECTOMY N/A 10/14/2013   Procedure: MICRODISCECTOMY LUMBAR LAMINECTOMY/L4-5 Decompression, Excision Extradural Intraspinal Cyst;  Surgeon: Mark C Yates, MD;  Location: [facility] OR;  Service: Orthopedics;  Laterality: N/A;  L4-5 Decompression, Excision Extradural Intraspinal Cyst    Family History  Problem Relation Age of Onset   High blood pressure Mother    Diabetes Sister    High blood pressure Sister    Breast cancer Sister    Colon cancer Neg Hx    Esophageal cancer Neg Hx     Social History:  reports that she has quit smoking. Her smoking use included cigarettes. She has never used smokeless tobacco. She reports current alcohol use of about 3.0 standard drinks of alcohol per week. She reports that she does not use drugs.  Allergies:  Allergies  Allergen Reactions    Contrast Media [Iodinated Contrast Media] Other (See Comments)    Reaction not noted on MAR    Medications: I have reviewed the patient's current medications.  Results for orders placed or performed during the hospital encounter of 04/17/23 (from the past 48 hour(s))  CBC with Differential/Platelet     Status: None   Collection Time: 04/17/23  5:40 PM  Result Value Ref Range   WBC 7.7 4.0 - 10.5 K/uL   RBC 4.09 3.87 - 5.11 MIL/uL   Hemoglobin 12.6 12.0 - 15.0 g/dL   HCT 38.8 36.0 - 46.0 %   MCV 94.9 80.0 - 100.0 fL   MCH 30.8 26.0 - 34.0 pg   MCHC 32.5 30.0 - 36.0 g/dL   RDW 14.6 11.5 - 15.5 %   Platelets 166 150 - 400 K/uL   nRBC 0.0 0.0 - 0.2 %   Neutrophils Relative % 74 %   Neutro Abs 5.7 1.7 - 7.7 K/uL   Lymphocytes Relative 15 %   Lymphs Abs 1.1 0.7 - 4.0 K/uL   Monocytes Relative 9 %   Monocytes Absolute 0.7 0.1 - 1.0 K/uL   Eosinophils Relative 2 %   Eosinophils Absolute 0.1 0.0 - 0.5 K/uL   Basophils Relative 0 %   Basophils Absolute 0.0 0.0 - 0.1 K/uL   Immature Granulocytes 0 %     Abs Immature Granulocytes 0.02 0.00 - 0.07 K/uL    Comment: Performed at [facility], [address] [place], [place] [address]  Comprehensive metabolic panel     Status: Abnormal   Collection Time: 04/17/23  5:40 PM  Result Value Ref Range   Sodium 139 135 - 145 mmol/L   Potassium 3.5 3.5 - 5.1 mmol/L   Chloride 103 98 - 111 mmol/L   CO2 28 22 - 32 mmol/L   Glucose, Bld 100 (H) 70 - 99 mg/dL    Comment: Glucose reference range applies only to samples taken after fasting for at least 8 hours.   BUN 18 8 - 23 mg/dL   Creatinine, Ser 0.59 0.44 - 1.00 mg/dL   Calcium 9.0 8.9 - 10.3 mg/dL   Total Protein 7.2 6.5 - 8.1 g/dL   Albumin 3.7 3.5 - 5.0 g/dL   AST 18 15 - 41 U/L   ALT 15 0 - 44 U/L   Alkaline Phosphatase 50 38 - 126 U/L   Total Bilirubin 1.7 (H) 0.3 - 1.2 mg/dL   GFR, Estimated >60 >60 mL/min    Comment: (NOTE) Calculated using the CKD-EPI Creatinine  Equation (2021)    Anion gap 8 5 - 15    Comment: Performed at [facility], [address] [place], [place] [address]  ABO/Rh     Status: None   Collection Time: 04/17/23  6:07 PM  Result Value Ref Range   ABO/RH(D)      A POS Performed at [facility], [address] [place], [place] [address]   Type and screen [facility]     Status: None   Collection Time: 04/17/23  9:46 PM  Result Value Ref Range   ABO/RH(D) A POS    Antibody Screen NEG    Sample Expiration      04/20/2023,2359 Performed at [facility], [address] [place], [place] [address]   CBC     Status: Abnormal   Collection Time: 04/18/23  5:26 AM  Result Value Ref Range   WBC 6.3 4.0 - 10.5 K/uL   RBC 3.48 (L) 3.87 - 5.11 MIL/uL   Hemoglobin 10.9 (L) 12.0 - 15.0 g/dL   HCT 33.3 (L) 36.0 - 46.0 %   MCV 95.7 80.0 - 100.0 fL   MCH 31.3 26.0 - 34.0 pg   MCHC 32.7 30.0 - 36.0 g/dL   RDW 14.6 11.5 - 15.5 %   Platelets 136 (L) 150 - 400 K/uL   nRBC 0.0 0.0 - 0.2 %    Comment: Performed at [facility], [address] [place], [place] [address]  Basic metabolic panel     Status: Abnormal   Collection Time: 04/18/23  5:26 AM  Result Value Ref Range   Sodium 137 135 - 145 mmol/L   Potassium 3.1 (L) 3.5 - 5.1 mmol/L   Chloride 109 98 - 111 mmol/L   CO2 22 22 - 32 mmol/L   Glucose, Bld 91 70 - 99 mg/dL    Comment: Glucose reference range applies only to samples taken after fasting for at least 8 hours.   BUN 11 8 - 23 mg/dL   Creatinine, Ser 0.44 0.44 - 1.00 mg/dL   Calcium 7.5 (L) 8.9 - 10.3 mg/dL   GFR, Estimated >60 >60 mL/min    Comment: (NOTE) Calculated using the CKD-EPI Creatinine Equation (2021)    Anion gap 6 5 - 15      Comment: Performed at [facility], [address] [place],  [address]    CT Hip Left Wo Contrast  Result Date: 04/17/2023 CLINICAL DATA:  Possible left hip  fracture.  Hip trauma EXAM: CT OF THE LEFT HIP WITHOUT CONTRAST TECHNIQUE: Multidetector CT imaging of the left hip was performed according to the standard protocol. Multiplanar CT image reconstructions were also generated. RADIATION DOSE REDUCTION: This exam was performed according to the departmental dose-optimization program which includes automated exposure control, adjustment of the mA and/or kV according to patient size and/or use of iterative reconstruction technique. COMPARISON:  Radiographs 04/17/2023 FINDINGS: Bones/Joint/Cartilage There is cortical disruption of the anterior cortex of the femoral neck near the head neck junction (series 3/image 81 and 8/64). Irregular sclerosis at the head neck junction. Findings are suspicious for nondisplaced fracture. Degenerative changes about the left hip, SI joint, and visualized lumbar spine. Ligaments Suboptimally assessed by CT. Muscles and Tendons Unremarkable CT appearance. Soft tissues Large stool ball in the rectum with rectal mild wall thickening. Mild subcutaneous edema in the left gluteal soft tissues. IMPRESSION: 1. Findings suspicious for nondisplaced subcapital left femoral neck fracture. Consider MRI for confirmation. 2. Large stool ball in the rectum with mild rectal wall thickening suggesting impaction and stercoral colitis. Electronically Signed   By: Tyler  Stutzman M.D.   On: 04/17/2023 18:58   DG Tibia/Fibula Left  Result Date: 04/17/2023 CLINICAL DATA:  Fall yesterday. EXAM: LEFT TIBIA AND FIBULA - 2 VIEW COMPARISON:  None Available. FINDINGS: There is no evidence of fracture or other focal bone lesions. Soft tissues are unremarkable. IMPRESSION: Negative. Electronically Signed   By: James  Green Jr M.D.   On: 04/17/2023 18:54   DG Femur 1V Left  Result Date: 04/17/2023 CLINICAL DATA:  Fall yesterday. EXAM: LEFT FEMUR 1 VIEW COMPARISON:  None Available. FINDINGS: Minimally displaced subcapital fracture of proximal left femur cannot be  excluded. Correlation with CT findings of same day is recommended. No abnormality seen involving middle and distal portions of the left femur. IMPRESSION: Findings concerning for possible minimally displaced subcapital fracture of proximal left femur. Correlation with CT findings is recommended. Electronically Signed   By: James  Green Jr M.D.   On: 04/17/2023 18:52   DG Knee Complete 4 Views Left  Result Date: 04/17/2023 CLINICAL DATA:  Fell EXAM: LEFT KNEE - COMPLETE 4+ VIEW COMPARISON:  None Available. FINDINGS: Frontal, bilateral oblique, and cross-table lateral views of the left knee are obtained. Diffuse osteopenia. There are no acute displaced fractures. Mild 3 compartmental osteoarthritis. Trace joint effusion. Soft tissues are unremarkable. IMPRESSION: 1. No acute fracture. 2. Mild 3 compartmental osteoarthritis and likely reactive trace suprapatellar joint effusion. 3. Diffuse osteopenia. Electronically Signed   By: Annamaria Salah  Brown M.D.   On: 04/17/2023 17:45   DG Hips Bilat W or Wo Pelvis 3-4 Views  Result Date: 04/17/2023 CLINICAL DATA:  Fell yesterday, confusion EXAM: DG HIP (WITH OR WITHOUT PELVIS) 3-4V BILAT COMPARISON:  03/26/2020 FINDINGS: Frontal view of the pelvis, frontal and frogleg lateral views of the right hip, and frontal and cross-table lateral views of the left hip are obtained on 5 images. There is severe bilateral hip osteoarthritis. Subtle sclerosis seen in the subcapital region of the left femoral neck likely reflects a collar of osteophytes rather than nondisplaced fracture. Underlying trabecula appear intact. If nondisplaced fracture is a concern, CT could be considered. No other signs of acute displaced fracture. Remainder of the bony pelvis is unremarkable. Large amount of   stool within the rectal vault consistent with fecal impaction. IMPRESSION: 1. Severe bilateral hip osteoarthritis. 2. Left femoral neck subcapital sclerosis favors abundant osteophytes over nondisplaced  fracture. The underlying trabecula appear intact. If there is concern for nondisplaced left hip fracture a CT could be performed. 3. Otherwise no acute bony abnormalities. 4. Large amount of retained stool in the rectal vault concerning for fecal impaction. Electronically Signed   By: Tamica Covell  Brown M.D.   On: 04/17/2023 17:45   CT Head Wo Contrast  Result Date: 04/17/2023 CLINICAL DATA:  Dementia, fell yesterday EXAM: CT HEAD WITHOUT CONTRAST TECHNIQUE: Contiguous axial images were obtained from the base of the skull through the vertex without intravenous contrast. RADIATION DOSE REDUCTION: This exam was performed according to the departmental dose-optimization program which includes automated exposure control, adjustment of the mA and/or kV according to patient size and/or use of iterative reconstruction technique. COMPARISON:  04/14/2023 FINDINGS: Brain: Stable chronic small-vessel ischemic changes throughout the periventricular white matter. No evidence of acute infarct or hemorrhage. Lateral ventricles and midline structures are stable. No acute extra-axial fluid collections. No mass effect. Vascular: Stable atherosclerosis.  No hyperdense vessel. Skull: Normal. Negative for fracture or focal lesion. Sinuses/Orbits: No acute finding. Other: None. IMPRESSION: 1. Stable head CT, no acute intracranial process. Electronically Signed   By: Verginia Toohey  Brown M.D.   On: 04/17/2023 17:15    Review of Systems  Unable to perform ROS: Dementia   Blood pressure 135/86, pulse (!) 51, temperature 97.8 F (36.6 C), temperature source Oral, resp. rate 16, height 5' 6" (1.676 m), weight 59 kg, SpO2 97 %. Physical Exam Constitutional:      General: She is not in acute distress.    Appearance: She is well-developed. She is not diaphoretic.  HENT:     Head: Normocephalic and atraumatic.  Eyes:     General: No scleral icterus.       Right eye: No discharge.        Left eye: No discharge.     Conjunctiva/sclera:  Conjunctivae normal.  Cardiovascular:     Rate and Rhythm: Normal rate and regular rhythm.  Pulmonary:     Effort: Pulmonary effort is normal. No respiratory distress.  Musculoskeletal:     Cervical back: Normal range of motion.     Comments: LLE No traumatic wounds, ecchymosis, or rash  Mod TTP hip  No knee or ankle effusion  Knee stable to varus/ valgus and anterior/posterior stress  Sens DPN, SPN, TN could not assess  Motor EHL, ext, flex, evers grossly intact  DP 2+, PT 2+, No significant edema  Skin:    General: Skin is warm and dry.  Psychiatric:        Behavior: Behavior is agitated.     Assessment/Plan: Left hip fx -- Plan hip hemi tomorrow with Dr. Yates. Please keep NPO after MN. Dementia    Breella Vanostrand J. Khaliah Barnick, PA-C Orthopedic Surgery [phone] 04/18/2023, 11:19 AM  

## 2023-04-18 NOTE — ED Notes (Signed)
[facility] contacted for transport  

## 2023-04-18 NOTE — Consult Note (Signed)
Palliative Care Consult Note                                  Date: 04/18/2023   Patient Name: Alicia [name]  DOB: 1936/08/30  MRN: [id number]  Age / Sex: 87 y.o., female  PCP: [name], MD Referring Physician: Rolly Salter, MD  Reason for Consultation: Establishing goals of care  HPI/Patient Profile: 87 y.o. female  with past medical history of advanced dementia who presented to [facility] ED from memory care unit on 04/17/2023 after a mechanical fall and was found to have a left femur fracture.  She was transferred to [facility] on 04/18/2023 and is scheduled for surgery on 04/19/2023.  Past Medical History:  Diagnosis Date  . [name] 01/17/2019  . Anxiety   . Arthritis   . Frequent UTI    over the last year  . Hypertension   . Inguinal hernia    right  . Shingles   . Urinary frequency    over the year  . Urinary urgency   . Wears glasses     Subjective:   I have reviewed medical records including progress notes, labs and imaging, and assessed the patient at bedside.  She is awake and pleasantly confused.  She is oriented to self only.  I met with her husband/Alicia [name] at bedside to discuss diagnosis, prognosis, GOC, disposition, and options.  I introduced Palliative Medicine as specialized medical care for people living with serious illness. It focuses on providing relief from the symptoms and stress of a serious illness.   We discussed patient's current illness and what it means in the larger context of his/her ongoing co-morbidities. Current clinical status was reviewed. Natural disease trajectory of *** was discussed.  Created space and opportunity for patient and family to explore thoughts and feelings regarding current medical situation.  Values and goals of care important to patient and family were attempted to be elicited.  A discussion was had today regarding advanced directives. Concepts specific to  code status, artifical feeding and hydration, continued IV antibiotics and rehospitalization was had.  The MOST form was introduced and discussed.  Questions and concerns addressed. Patient/family encouraged to call with questions or concerns.     Life Review: Alicia [name] and how have been married for 64 years.  They are both originally from [place]. They share 2 sons - Nida Boatman (lives in [place]) and Grapevine (lives in [place]).  Functional Status: ***  Patient/Family Understanding of Illness: ***  Patient Values: ***  Goals: ***  Additional Discussion: ***  Review of Systems  Objective:   Primary Diagnoses: Present on Admission: . Hip fracture (HCC) . [name]   Physical Exam  Vital Signs:  BP 136/77 (BP Location: Right Arm)   Pulse 75   Temp 97.8 F (36.6 C) (Oral)   Resp 16   Ht 5\' 6"  (1.676 m)   Wt 59 kg   SpO2 99%   BMI 20.98 kg/m   Palliative Assessment/Data: ***     Assessment & Plan:   SUMMARY OF RECOMMENDATIONS   ***  Primary Decision Maker: {Primary Decision NWGNF:62130}  Code Status/Advance Care Planning: {Palliative Code status:23503}  Symptom Management:  ***  Prognosis:  {Palliative Care Prognosis:23504}  Discharge Planning:  {Palliative dispostion:23505}   Discussed with: ***    Thank you for allowing [place] to participate in the care of Yenifer C Mellinger  Time Total: ***  Greater than 50%  of this time was spent counseling and coordinating care related to the above assessment and plan.  Signed by: Sherlean Foot, NP Palliative Medicine Team  Team Phone # [phone]  For individual providers, please see AMION

## 2023-04-19 ENCOUNTER — Other Ambulatory Visit: Payer: Self-pay

## 2023-04-19 ENCOUNTER — Inpatient Hospital Stay (HOSPITAL_COMMUNITY): Payer: Medicare Other

## 2023-04-19 ENCOUNTER — Inpatient Hospital Stay (HOSPITAL_COMMUNITY): Payer: Medicare Other | Admitting: Certified Registered"

## 2023-04-19 ENCOUNTER — Encounter (HOSPITAL_COMMUNITY): Payer: Self-pay | Admitting: Internal Medicine

## 2023-04-19 ENCOUNTER — Encounter (HOSPITAL_COMMUNITY)
Admission: EM | Disposition: A | Discharge status: Skilled Nursing Facility | Payer: Self-pay | Source: Home / Self Care | Attending: Orthopaedic Surgery

## 2023-04-19 DIAGNOSIS — Z87891 Personal history of nicotine dependence: Secondary | ICD-10-CM

## 2023-04-19 DIAGNOSIS — S72002A Fracture of unspecified part of neck of left femur, initial encounter for closed fracture: Secondary | ICD-10-CM

## 2023-04-19 DIAGNOSIS — F0390 Unspecified dementia without behavioral disturbance: Secondary | ICD-10-CM

## 2023-04-19 DIAGNOSIS — I10 Essential (primary) hypertension: Secondary | ICD-10-CM | POA: Diagnosis not present

## 2023-04-19 HISTORY — PX: HIP ARTHROPLASTY: SHX981

## 2023-04-19 LAB — CBC
HCT: 34.3 % — ABNORMAL LOW (ref 36.0–46.0)
Hemoglobin: 11.4 g/dL — ABNORMAL LOW (ref 12.0–15.0)
MCH: 31 pg (ref 26.0–34.0)
MCHC: 33.2 g/dL (ref 30.0–36.0)
MCV: 93.2 fL (ref 80.0–100.0)
Platelets: 161 10*3/uL (ref 150–400)
RBC: 3.68 MIL/uL — ABNORMAL LOW (ref 3.87–5.11)
RDW: 14.5 % (ref 11.5–15.5)
WBC: 4.8 10*3/uL (ref 4.0–10.5)
nRBC: 0 % (ref 0.0–0.2)

## 2023-04-19 LAB — BASIC METABOLIC PANEL
Anion gap: 8 (ref 5–15)
BUN: 11 mg/dL (ref 8–23)
CO2: 24 mmol/L (ref 22–32)
Calcium: 8.8 mg/dL — ABNORMAL LOW (ref 8.9–10.3)
Chloride: 105 mmol/L (ref 98–111)
Creatinine, Ser: 0.56 mg/dL (ref 0.44–1.00)
GFR, Estimated: >60 mL/min (ref >60)
Glucose, Bld: 92 mg/dL (ref 70–99)
Potassium: 3.9 mmol/L (ref 3.5–5.1)
Sodium: 137 mmol/L (ref 135–145)

## 2023-04-19 LAB — GLUCOSE, CAPILLARY: Glucose-Capillary: 91 mg/dL (ref 70–99)

## 2023-04-19 LAB — SURGICAL PCR SCREEN
MRSA, PCR: NEGATIVE
Staphylococcus aureus: NEGATIVE

## 2023-04-19 SURGERY — HEMIARTHROPLASTY, HIP, DIRECT ANTERIOR APPROACH, FOR FRACTURE
Anesthesia: General | Site: Hip | Laterality: Left

## 2023-04-19 MED ORDER — ASPIRIN 325 MG PO TBEC
325.0000 mg | DELAYED_RELEASE_TABLET | Freq: Every day | ORAL | Status: DC
  Administered 2023-04-20 – 2023-04-26 (×7): 325 mg via ORAL
  Filled 2023-04-19 (×7): qty 1

## 2023-04-19 MED ORDER — ROCURONIUM BROMIDE 10 MG/ML (PF) SYRINGE
PREFILLED_SYRINGE | INTRAVENOUS | Status: DC | PRN
  Administered 2023-04-19: 50 mg via INTRAVENOUS

## 2023-04-19 MED ORDER — HYDROCODONE-ACETAMINOPHEN 5-325 MG PO TABS
1.0000 | ORAL_TABLET | ORAL | Status: DC | PRN
  Administered 2023-04-20 (×2): 1 via ORAL
  Filled 2023-04-19: qty 1

## 2023-04-19 MED ORDER — VASOPRESSIN 20 UNIT/ML IV SOLN
INTRAVENOUS | Status: AC
  Filled 2023-04-19: qty 1

## 2023-04-19 MED ORDER — PROPOFOL 10 MG/ML IV BOLUS
INTRAVENOUS | Status: DC | PRN
  Administered 2023-04-19: 20 mg via INTRAVENOUS
  Administered 2023-04-19: 70 mg via INTRAVENOUS

## 2023-04-19 MED ORDER — SUGAMMADEX SODIUM 200 MG/2ML IV SOLN
INTRAVENOUS | Status: DC | PRN
  Administered 2023-04-19: 50 mg via INTRAVENOUS
  Administered 2023-04-19: 100 mg via INTRAVENOUS

## 2023-04-19 MED ORDER — DOCUSATE SODIUM 100 MG PO CAPS
100.0000 mg | ORAL_CAPSULE | Freq: Two times a day (BID) | ORAL | Status: DC
  Administered 2023-04-21 – 2023-04-26 (×7): 100 mg via ORAL
  Filled 2023-04-19 (×9): qty 1

## 2023-04-19 MED ORDER — FENTANYL CITRATE (PF) 100 MCG/2ML IJ SOLN
25.0000 ug | INTRAMUSCULAR | Status: DC | PRN
  Administered 2023-04-19 (×2): 25 ug via INTRAVENOUS

## 2023-04-19 MED ORDER — TRANEXAMIC ACID-NACL 1000-0.7 MG/100ML-% IV SOLN
INTRAVENOUS | Status: AC
  Filled 2023-04-19: qty 100

## 2023-04-19 MED ORDER — ONDANSETRON HCL 4 MG/2ML IJ SOLN: 4.0000 mg | Freq: Four times a day (QID) | INTRAMUSCULAR | Status: DC | PRN

## 2023-04-19 MED ORDER — ESMOLOL HCL 100 MG/10ML IV SOLN
INTRAVENOUS | Status: DC | PRN
  Administered 2023-04-19: 20 mg via INTRAVENOUS

## 2023-04-19 MED ORDER — PROPOFOL 10 MG/ML IV BOLUS
INTRAVENOUS | Status: AC
  Filled 2023-04-19: qty 20

## 2023-04-19 MED ORDER — PHENYLEPHRINE HCL-NACL 20-0.9 MG/250ML-% IV SOLN
INTRAVENOUS | Status: DC | PRN
  Administered 2023-04-19: 20 ug/min via INTRAVENOUS

## 2023-04-19 MED ORDER — PHENOL 1.4 % MT LIQD: 1.0000 | OROMUCOSAL | Status: DC | PRN

## 2023-04-19 MED ORDER — MENTHOL 3 MG MT LOZG: 1.0000 | LOZENGE | OROMUCOSAL | Status: DC | PRN

## 2023-04-19 MED ORDER — DEXAMETHASONE SODIUM PHOSPHATE 10 MG/ML IJ SOLN
INTRAMUSCULAR | Status: DC | PRN
  Administered 2023-04-19: 4 mg via INTRAVENOUS

## 2023-04-19 MED ORDER — CHLORHEXIDINE GLUCONATE 0.12 % MT SOLN
OROMUCOSAL | Status: AC
  Filled 2023-04-19: qty 15

## 2023-04-19 MED ORDER — ACETAMINOPHEN 500 MG PO TABS
500.0000 mg | ORAL_TABLET | Freq: Four times a day (QID) | ORAL | Status: AC
  Administered 2023-04-19 – 2023-04-20 (×3): 500 mg via ORAL
  Filled 2023-04-19 (×4): qty 1

## 2023-04-19 MED ORDER — LIDOCAINE 2% (20 MG/ML) 5 ML SYRINGE
INTRAMUSCULAR | Status: DC | PRN
  Administered 2023-04-19: 50 mg via INTRAVENOUS

## 2023-04-19 MED ORDER — BUPIVACAINE HCL (PF) 0.25 % IJ SOLN
INTRAMUSCULAR | Status: DC | PRN
  Administered 2023-04-19: 10 mL

## 2023-04-19 MED ORDER — FENTANYL CITRATE (PF) 100 MCG/2ML IJ SOLN
INTRAMUSCULAR | Status: AC
  Filled 2023-04-19: qty 2

## 2023-04-19 MED ORDER — FENTANYL CITRATE (PF) 250 MCG/5ML IJ SOLN
INTRAMUSCULAR | Status: DC | PRN
  Administered 2023-04-19: 25 ug via INTRAVENOUS
  Administered 2023-04-19 (×2): 50 ug via INTRAVENOUS
  Administered 2023-04-19: 100 ug via INTRAVENOUS
  Administered 2023-04-19: 25 ug via INTRAVENOUS

## 2023-04-19 MED ORDER — 0.9 % SODIUM CHLORIDE (POUR BTL) OPTIME
TOPICAL | Status: DC | PRN
  Administered 2023-04-19: 1000 mL

## 2023-04-19 MED ORDER — FENTANYL CITRATE (PF) 250 MCG/5ML IJ SOLN
INTRAMUSCULAR | Status: AC
  Filled 2023-04-19: qty 5

## 2023-04-19 MED ORDER — MORPHINE SULFATE (PF) 2 MG/ML IV SOLN: 0.5000 mg | INTRAVENOUS | Status: DC | PRN

## 2023-04-19 MED ORDER — PHENYLEPHRINE 80 MCG/ML (10ML) SYRINGE FOR IV PUSH (FOR BLOOD PRESSURE SUPPORT)
PREFILLED_SYRINGE | INTRAVENOUS | Status: DC | PRN
  Administered 2023-04-19 (×2): 80 ug via INTRAVENOUS

## 2023-04-19 MED ORDER — ONDANSETRON HCL 4 MG PO TABS: 4.0000 mg | ORAL_TABLET | Freq: Four times a day (QID) | ORAL | Status: DC | PRN

## 2023-04-19 MED ORDER — SODIUM CHLORIDE 0.45 % IV SOLN: INTRAVENOUS | Status: DC

## 2023-04-19 MED ORDER — METOCLOPRAMIDE HCL 5 MG PO TABS: 5.0000 mg | ORAL_TABLET | Freq: Three times a day (TID) | ORAL | Status: DC | PRN

## 2023-04-19 MED ORDER — DROPERIDOL 2.5 MG/ML IJ SOLN: 0.6250 mg | Freq: Once | INTRAMUSCULAR | Status: DC | PRN

## 2023-04-19 MED ORDER — BUPIVACAINE HCL (PF) 0.25 % IJ SOLN
INTRAMUSCULAR | Status: AC
  Filled 2023-04-19: qty 30

## 2023-04-19 MED ORDER — ACETAMINOPHEN 10 MG/ML IV SOLN
INTRAVENOUS | Status: AC
  Filled 2023-04-19: qty 100

## 2023-04-19 MED ORDER — METOCLOPRAMIDE HCL 5 MG/ML IJ SOLN: 5.0000 mg | Freq: Three times a day (TID) | INTRAMUSCULAR | Status: DC | PRN

## 2023-04-19 SURGICAL SUPPLY — 68 items
APL SKNCLS STERI-STRIP NONHPOA (GAUZE/BANDAGES/DRESSINGS)
BAG COUNTER SPONGE SURGICOUNT (BAG) ×1 IMPLANT
BAG SPNG CNTER NS LX DISP (BAG) ×1
BENZOIN TINCTURE PRP APPL 2/3 (GAUZE/BANDAGES/DRESSINGS) ×1 IMPLANT
BIPOLAR PROS AML 46 (Hips) ×1 IMPLANT
BLADE CLIPPER SURG (BLADE) IMPLANT
BLADE SAW SGTL 73X25 THK (BLADE) ×1 IMPLANT
BRUSH FEMORAL CANAL (MISCELLANEOUS) IMPLANT
COVER BACK TABLE 24X17X13 BIG (DRAPES) IMPLANT
DRAPE IMP U-DRAPE 54X76 (DRAPES) ×1 IMPLANT
DRAPE INCISE IOBAN 66X45 STRL (DRAPES) IMPLANT
DRAPE ORTHO SPLIT 77X108 STRL (DRAPES) ×2
DRAPE SURG ORHT 6 SPLT 77X108 (DRAPES) ×2 IMPLANT
DRAPE U-SHAPE 47X51 STRL (DRAPES) ×1 IMPLANT
DRSG AQUACEL AG ADV 3.5X10 (GAUZE/BANDAGES/DRESSINGS) IMPLANT
DRSG MEPILEX POST OP 4X8 (GAUZE/BANDAGES/DRESSINGS) ×1 IMPLANT
DURAPREP 26ML APPLICATOR (WOUND CARE) ×1 IMPLANT
ELECT CAUTERY BLADE 6.4 (BLADE) ×1 IMPLANT
ELECT REM PT RETURN 9FT ADLT (ELECTROSURGICAL) ×1
ELECTRODE REM PT RTRN 9FT ADLT (ELECTROSURGICAL) ×1 IMPLANT
EVACUATOR 1/8 PVC DRAIN (DRAIN) IMPLANT
FACESHIELD WRAPAROUND (MASK) ×2 IMPLANT
FACESHIELD WRAPAROUND OR TEAM (MASK) ×2 IMPLANT
GAUZE PAD ABD 8X10 STRL (GAUZE/BANDAGES/DRESSINGS) ×2 IMPLANT
GAUZE SPONGE 4X4 12PLY STRL (GAUZE/BANDAGES/DRESSINGS) ×1 IMPLANT
GAUZE XEROFORM 5X9 LF (GAUZE/BANDAGES/DRESSINGS) ×1 IMPLANT
GLOVE BIOGEL PI IND STRL 8 (GLOVE) ×2 IMPLANT
GLOVE ORTHO TXT STRL SZ7.5 (GLOVE) ×2 IMPLANT
GOWN STRL REUS W/ TWL LRG LVL3 (GOWN DISPOSABLE) ×1 IMPLANT
GOWN STRL REUS W/ TWL XL LVL3 (GOWN DISPOSABLE) ×1 IMPLANT
GOWN STRL REUS W/TWL 2XL LVL3 (GOWN DISPOSABLE) ×1 IMPLANT
GOWN STRL REUS W/TWL LRG LVL3 (GOWN DISPOSABLE) ×1
GOWN STRL REUS W/TWL XL LVL3 (GOWN DISPOSABLE) ×1
HANDPIECE INTERPULSE COAX TIP (DISPOSABLE)
HEAD BIPOLAR PROS AML 46 (Hips) IMPLANT
HEAD FEM STD 28X+1.5 STRL (Hips) IMPLANT
IMMOBILIZER KNEE 22 UNIV (SOFTGOODS) IMPLANT
KIT BASIN OR (CUSTOM PROCEDURE TRAY) ×1 IMPLANT
KIT TURNOVER KIT B (KITS) ×1 IMPLANT
MANIFOLD NEPTUNE II (INSTRUMENTS) ×1 IMPLANT
NDL HYPO 25GX1X1/2 BEV (NEEDLE) ×1 IMPLANT
NDL SUT 2 .5 CRC MAYO 1.732X (NEEDLE) ×1 IMPLANT
NEEDLE HYPO 25GX1X1/2 BEV (NEEDLE) ×1 IMPLANT
NEEDLE MAYO TAPER (NEEDLE) ×1
NS IRRIG 1000ML POUR BTL (IV SOLUTION) ×1 IMPLANT
PACK TOTAL JOINT (CUSTOM PROCEDURE TRAY) ×1 IMPLANT
PACK UNIVERSAL I (CUSTOM PROCEDURE TRAY) ×1 IMPLANT
PAD ARMBOARD 7.5X6 YLW CONV (MISCELLANEOUS) ×2 IMPLANT
PIN STEINMAN 3/16 (PIN) ×1 IMPLANT
SET HNDPC FAN SPRY TIP SCT (DISPOSABLE) IMPLANT
SPONGE T-LAP 4X18 ~~LOC~~+RFID (SPONGE) ×2 IMPLANT
STAPLER VISISTAT 35W (STAPLE) ×1 IMPLANT
STEM SUMMIT BASIC PRESSFIT SZ3 (Hips) IMPLANT
STRIP CLOSURE SKIN 1/2X4 (GAUZE/BANDAGES/DRESSINGS) ×2 IMPLANT
SUCTION FRAZIER HANDLE 10FR (MISCELLANEOUS) ×1
SUCTION TUBE FRAZIER 10FR DISP (MISCELLANEOUS) ×1 IMPLANT
SUMMIT BASIC PRESSFIT SZ3 (Hips) ×1 IMPLANT
SUT ETHIBOND NAB CT1 #1 30IN (SUTURE) ×3 IMPLANT
SUT TICRON (SUTURE) ×2 IMPLANT
SUT VIC AB 2-0 CT1 27 (SUTURE) ×3
SUT VIC AB 2-0 CT1 TAPERPNT 27 (SUTURE) ×3 IMPLANT
SUT VICRYL 0 TIES 12 18 (SUTURE) ×1 IMPLANT
SYR CONTROL 10ML LL (SYRINGE) ×1 IMPLANT
TOWEL GREEN STERILE (TOWEL DISPOSABLE) ×1 IMPLANT
TOWEL GREEN STERILE FF (TOWEL DISPOSABLE) ×1 IMPLANT
TOWER CARTRIDGE SMART MIX (DISPOSABLE) IMPLANT
TRAY FOLEY MTR SLVR 16FR STAT (SET/KITS/TRAYS/PACK) IMPLANT
WATER STERILE IRR 1000ML POUR (IV SOLUTION) ×4 IMPLANT

## 2023-04-19 NOTE — Op Note (Signed)
Pre and postop diagnosis left femoral neck fracture.  Procedure left hip bipolar hemiarthroplasty for femoral neck fracture.  Surgeon: Annell Greening, MD  Assistant: Youlanda Roys, RNFA medically necessary present for the entire procedure  Anesthesia General orotracheal +10 cc Marcaine local.  EBL 150 cc  Implantsmplants  HEAD FEM STD 28X+1.5 STRL - [id number]  Inventory Item: HEAD FEM STD 28X+1.5 STRL Serial no.: Model/Cat no.: [id number]  Implant name: HEAD FEM STD 28X+1.5 STRL - [id number] Laterality: Left Area: Hip  Manufacturer: [employer] Date of Manufacture:   Action: Implanted Number Used: 1   Device Identifier: Device Identifier Type:   BIPOLAR PROS AML 46 - [id number]  Inventory Item: BIPOLAR PROS AML 46 Serial no.: Model/Cat no.: [id number]  Implant name: BIPOLAR PROS AML 46 - [id number] Laterality: Left Area: Hip  Manufacturer: [employer] Date of Manufacture:   Action: Implanted Number Used: 1   Device Identifier: Device Identifier Type:   SUMMIT BASIC PRESSFIT SZ3 - [id number]  Inventory Item: SUMMIT BASIC PRESSFIT SZ3 Serial no.: Model/Cat no.: [id number]  Implant name: SUMMIT BASIC PRESSFIT SZ3 - [id number] Laterality: Left Area: Hip  Manufacturer: [employer] Date of Manufacture:   Action: Implanted Number Used: 1   Device Identifier: Device Identifier Type:    DePuy bipolar 46 mm ball +1.5 neck size 3 Summit basic press-fit stem.  Procedure: After standard prepping and draping with patient lateral position axillary roll Alicia [name] 7 frame hip was prepped with DuraPrep the usual 1015 had been applied.  Standard total hip sheets drapes were applied impervious stockinette Coban sterile skin marker Betadine Steri-Drape x 2 sealing the skin.  Patient had dementia history of falls and for that reason bipolar was selected with likely patient is not likely posterior hip precautions.  Posterior approach was made gluteus maximus split in line with the fibers  piriformis tagged for later repair capsule opened fracture line femoral neck was visualized next cut 11 mm neck head was removed with a corkscrew.  Lateralizer canal starter trochanteric reamer was performed medial retractor underneath the neck.  Ball femoral head that was removed measured 46 mm side good suction fit.  Progression of the stem to a size 3.  +1.5 snap-on hand with bipolar 46 mm in size 3 press-fit Summit basic were all inserted sequentially and was assembled stamped on impacted checked.  Piriformis was used protecting the nerve fingertip at the posterior aspect of the joint to make sure the nerve cannot sublux and and the head was reduced with stability flexion and 90 internal rotation and 90.  Piriformis repaired.  Tensor fascia was  1.  Ethibond sutures.  2-0 Vicryl subtenons tissue Marcaine infiltration skin staple closure postop dressing and knee immobilizer.  Instrument count needle count was correct.

## 2023-04-19 NOTE — Progress Notes (Signed)
PROGRESS NOTE   Alicia [name]  WUJ:[id number] DOB: 1936-10-15 DOA: 04/17/2023 PCP: [name], MD  Brief Narrative:  87 year old [place] memory care resident-able to ambulate independently previously-known HLD depression dementia Frequent falls-last 1 5/17 x-rays negative for fracture-went back to facility-recurrent fall 5/20+ left hip fracture In ED Rx fleets enema Orthopedics, palliative care consulted  Hospital-Problem based course  Left-sided hip fracture Defer to Dr. Kevan Ny orthopedics regarding repair etc. Continue pain control Norco 5/325 every 6 as needed moderate pain, IV morphine 0.5 every 4 as needed severe pain Saline lock 50 cc/H once able to take p.o. well  Mild hypokalemia Replace with K-Dur 40 twice daily and check magnesium etc. in a.m.  Iron deficiency As hemoglobin is above 11 would not replace with IV iron although sats are low in the 9 range instead can use p.o. iron twice daily on discharge and may be refer to infusion clinic in the outpatient setting  HLD Continue Crestor 5 daily  Depression overlap depression Continue Aricept 5 daily Namenda 10 twice daily Continue Seroquel 25 at bedtime as needed agitation, sertraline 25 daily   DVT prophylaxis: SCD Code Status: DNR Family Communication: Husband at bedside Disposition:  Status is: Inpatient Remains inpatient appropriate because:   Require surgery and probable SNF placement     Subjective: Unable to orient and unable to obtain ROS Husband at bedside  Objective: Vitals:   04/18/23 1739 04/18/23 2013 04/19/23 0026 04/19/23 0456  BP: 138/89 (!) 150/96 112/83 (!) 134/90  Pulse: (!) 56 70 (!) 53 (!) 107  Resp:  17 18 18   Temp:  98.3 F (36.8 C) 97.9 F (36.6 C) 97.6 F (36.4 C)  TempSrc:  Oral  Oral  SpO2: 100% 96% (!) 87% 93%  Weight:      Height:        Intake/Output Summary (Last 24 hours) at 04/19/2023 0710 Last data filed at 04/19/2023 0550 Gross per 24 hour   Intake 1558.63 ml  Output 350 ml  Net 1208.63 ml   Filed Weights   04/17/23 1600  Weight: 59 kg    Examination:  EOMI NCAT no focal deficit S1-S2 no murmur Chest clear neck Abdomen soft ROM limited to left hip moving other limbs equally Confused  Data Reviewed: personally reviewed   CBC    Component Value Date/Time   WBC 4.8 04/19/2023 0620   RBC 3.68 (L) 04/19/2023 0620   HGB 11.4 (L) 04/19/2023 0620   HCT 34.3 (L) 04/19/2023 0620   PLT 161 04/19/2023 0620   MCV 93.2 04/19/2023 0620   MCH 31.0 04/19/2023 0620   MCHC 33.2 04/19/2023 0620   RDW 14.5 04/19/2023 0620   LYMPHSABS 1.1 04/17/2023 1740   MONOABS 0.7 04/17/2023 1740   EOSABS 0.1 04/17/2023 1740   BASOSABS 0.0 04/17/2023 1740      Latest Ref Rng & Units 04/18/2023    5:26 AM 04/17/2023    5:40 PM 04/14/2023    9:05 AM  CMP  Glucose 70 - 99 mg/dL 91  956  213   BUN 8 - 23 mg/dL 11  18  20    Creatinine 0.44 - 1.00 mg/dL 0.86  5.78  4.69   Sodium 135 - 145 mmol/L 137  139  139   Potassium 3.5 - 5.1 mmol/L 3.1  3.5  3.7   Chloride 98 - 111 mmol/L 109  103  105   CO2 22 - 32 mmol/L 22  28  27    Calcium 8.9 -  10.3 mg/dL 7.5  9.0  9.2   Total Protein 6.5 - 8.1 g/dL  7.2    Total Bilirubin 0.3 - 1.2 mg/dL  1.7    Alkaline Phos 38 - 126 U/L  50    AST 15 - 41 U/L  18    ALT 0 - 44 U/L  15       Radiology Studies: CT Hip Left Wo Contrast  Result Date: 04/17/2023 CLINICAL DATA:  Possible left hip fracture.  Hip trauma EXAM: CT OF THE LEFT HIP WITHOUT CONTRAST TECHNIQUE: Multidetector CT imaging of the left hip was performed according to the standard protocol. Multiplanar CT image reconstructions were also generated. RADIATION DOSE REDUCTION: This exam was performed according to the departmental dose-optimization program which includes automated exposure control, adjustment of the mA and/or kV according to patient size and/or use of iterative reconstruction technique. COMPARISON:  Radiographs 04/17/2023  FINDINGS: Bones/Joint/Cartilage There is cortical disruption of the anterior cortex of the femoral neck near the head neck junction (series 3/image 81 and 8/64). Irregular sclerosis at the head neck junction. Findings are suspicious for nondisplaced fracture. Degenerative changes about the left hip, SI joint, and visualized lumbar spine. Ligaments Suboptimally assessed by CT. Muscles and Tendons Unremarkable CT appearance. Soft tissues Large stool ball in the rectum with rectal mild wall thickening. Mild subcutaneous edema in the left gluteal soft tissues. IMPRESSION: 1. Findings suspicious for nondisplaced subcapital left femoral neck fracture. Consider MRI for confirmation. 2. Large stool ball in the rectum with mild rectal wall thickening suggesting impaction and stercoral colitis. Electronically Signed   By: Minerva Fester M.D.   On: 04/17/2023 18:58   DG Tibia/Fibula Left  Result Date: 04/17/2023 CLINICAL DATA:  Fall yesterday. EXAM: LEFT TIBIA AND FIBULA - 2 VIEW COMPARISON:  None Available. FINDINGS: There is no evidence of fracture or other focal bone lesions. Soft tissues are unremarkable. IMPRESSION: Negative. Electronically Signed   By: Lupita Raider M.D.   On: 04/17/2023 18:54   DG Femur 1V Left  Result Date: 04/17/2023 CLINICAL DATA:  Fall yesterday. EXAM: LEFT FEMUR 1 VIEW COMPARISON:  None Available. FINDINGS: Minimally displaced subcapital fracture of proximal left femur cannot be excluded. Correlation with CT findings of same day is recommended. No abnormality seen involving middle and distal portions of the left femur. IMPRESSION: Findings concerning for possible minimally displaced subcapital fracture of proximal left femur. Correlation with CT findings is recommended. Electronically Signed   By: Lupita Raider M.D.   On: 04/17/2023 18:52   DG Knee Complete 4 Views Left  Result Date: 04/17/2023 CLINICAL DATA:  Larey Seat EXAM: LEFT KNEE - COMPLETE 4+ VIEW COMPARISON:  None Available.  FINDINGS: Frontal, bilateral oblique, and cross-table lateral views of the left knee are obtained. Diffuse osteopenia. There are no acute displaced fractures. Mild 3 compartmental osteoarthritis. Trace joint effusion. Soft tissues are unremarkable. IMPRESSION: 1. No acute fracture. 2. Mild 3 compartmental osteoarthritis and likely reactive trace suprapatellar joint effusion. 3. Diffuse osteopenia. Electronically Signed   By: Sharlet Salina M.D.   On: 04/17/2023 17:45   DG Hips Bilat W or Wo Pelvis 3-4 Views  Result Date: 04/17/2023 CLINICAL DATA:  Larey Seat yesterday, confusion EXAM: DG HIP (WITH OR WITHOUT PELVIS) 3-4V BILAT COMPARISON:  03/26/2020 FINDINGS: Frontal view of the pelvis, frontal and frogleg lateral views of the right hip, and frontal and cross-table lateral views of the left hip are obtained on 5 images. There is severe bilateral hip osteoarthritis. Subtle sclerosis seen  in the subcapital region of the left femoral neck likely reflects a collar of osteophytes rather than nondisplaced fracture. Underlying trabecula appear intact. If nondisplaced fracture is a concern, CT could be considered. No other signs of acute displaced fracture. Remainder of the bony pelvis is unremarkable. Large amount of stool within the rectal vault consistent with fecal impaction. IMPRESSION: 1. Severe bilateral hip osteoarthritis. 2. Left femoral neck subcapital sclerosis favors abundant osteophytes over nondisplaced fracture. The underlying trabecula appear intact. If there is concern for nondisplaced left hip fracture a CT could be performed. 3. Otherwise no acute bony abnormalities. 4. Large amount of retained stool in the rectal vault concerning for fecal impaction. Electronically Signed   By: Sharlet Salina M.D.   On: 04/17/2023 17:45   CT Head Wo Contrast  Result Date: 04/17/2023 CLINICAL DATA:  Dementia, fell yesterday EXAM: CT HEAD WITHOUT CONTRAST TECHNIQUE: Contiguous axial images were obtained from the base of  the skull through the vertex without intravenous contrast. RADIATION DOSE REDUCTION: This exam was performed according to the departmental dose-optimization program which includes automated exposure control, adjustment of the mA and/or kV according to patient size and/or use of iterative reconstruction technique. COMPARISON:  04/14/2023 FINDINGS: Brain: Stable chronic small-vessel ischemic changes throughout the periventricular white matter. No evidence of acute infarct or hemorrhage. Lateral ventricles and midline structures are stable. No acute extra-axial fluid collections. No mass effect. Vascular: Stable atherosclerosis.  No hyperdense vessel. Skull: Normal. Negative for fracture or focal lesion. Sinuses/Orbits: No acute finding. Other: None. IMPRESSION: 1. Stable head CT, no acute intracranial process. Electronically Signed   By: Sharlet Salina M.D.   On: 04/17/2023 17:15     Scheduled Meds:  chlorhexidine  60 mL Topical Once   cholecalciferol  2,000 Units Oral Daily   donepezil  5 mg Oral Daily   melatonin  3 mg Oral QHS   memantine  10 mg Oral BID   mirtazapine  7.5 mg Oral QHS   polyethylene glycol  17 g Oral BID   [START ON 04/20/2023] polyethylene glycol  17 g Oral BID   povidone-iodine  2 Application Topical Once   rosuvastatin  5 mg Oral Daily   senna-docusate  2 tablet Oral QHS   sertraline  25 mg Oral Daily   sorbitol, milk of mag, mineral oil, glycerin (SMOG) enema  960 mL Rectal Once   Continuous Infusions:  sodium chloride 50 mL/hr at 04/19/23 0550    ceFAZolin (ANCEF) IV     tranexamic acid       LOS: 2 days   Time spent: 20  Rhetta Mura, MD [facility] To contact the attending provider between 7A-7P or the covering provider during after hours 7P-7A, please log into the web site [email or web address] and access using universal [place] password for that web site. If you do not have the password, please call the hospital operator.  04/19/2023, 7:10 AM

## 2023-04-19 NOTE — Transfer of Care (Signed)
Immediate Anesthesia Transfer of Care Note  Patient: Alicia [name]  Procedure(s) Performed: ARTHROPLASTY BIPOLAR HIP (HEMIARTHROPLASTY) POSTERIOR (Left: Hip)  Patient Location: PACU  Anesthesia Type:General  Level of Consciousness: drowsy  Airway & Oxygen Therapy: Patient Spontanous Breathing and Patient connected to face mask oxygen  Post-op Assessment: Report given to RN and Post -op Vital signs reviewed and stable  Post vital signs: Reviewed and stable  Last Vitals:  Vitals Value Taken Time  BP 164/98 04/19/23 1836  Temp 36.4 C 04/19/23 1836  Pulse 54 04/19/23 1840  Resp 12 04/19/23 1840  SpO2 100 % 04/19/23 1840  Vitals shown include unvalidated device data.  Last Pain:  Vitals:   04/19/23 1612  TempSrc: Axillary  PainSc:          Complications: No notable events documented.

## 2023-04-19 NOTE — Anesthesia Preprocedure Evaluation (Addendum)
Anesthesia Evaluation  Patient identified by MRN, date of birth, ID band  Reviewed: Allergy & Precautions, NPO status , Patient's Chart, lab work & pertinent test results  Airway Mallampati: II  TM Distance: >3 FB Neck ROM: Full    Dental  (+) Dental Advisory Given,    Pulmonary former smoker   Pulmonary exam normal breath sounds clear to auscultation       Cardiovascular hypertension,  Rhythm:Regular Rate:Normal  09/06/19 Nuclear stress EF: 73%. The left ventricular ejection fraction is hyperdynamic (>65%). The study is normal. This is a low risk study. There was no ST segment deviation noted during stress. No T wave inversion was noted during stress. Low risk stress nuclear study with normal perfusion and normal left ventricular regional and global systolic function.  TTE 08/2019  1. Left ventricular ejection fraction, by visual estimation, is 55 to 60%. The left ventricle has normal function. There is moderately increased left ventricular hypertrophy.  2. Left ventricular diastolic Doppler parameters are consistent with impaired relaxation pattern of LV diastolic filling.  3. Global right ventricle has normal systolic function.The right ventricular size is normal. No increase in right ventricular wall thickness.  4. Left atrial size was normal.  5. Right atrial size was normal.  6. The mitral valve is abnormal. Trace mitral valve regurgitation.  7. The tricuspid valve is grossly normal. Tricuspid valve regurgitation is trivial.  8. The aortic valve is tricuspid Aortic valve regurgitation is trivial by color flow Doppler. Mild aortic valve sclerosis without stenosis.  9. The pulmonic valve was grossly normal. Pulmonic valve regurgitation is not visualized by color flow Doppler. 10. Aortic dilatation noted. 11. There is mild dilatation of the ascending aorta measuring 40 mm. 12. The inferior vena cava is normal in size with  greater than 50% respiratory variability, suggesting right atrial pressure of 3 mmHg.   Neuro/Psych  PSYCHIATRIC DISORDERS Anxiety    Dementia negative neurological ROS     GI/Hepatic negative GI ROS, Neg liver ROS,,,  Endo/Other  negative endocrine ROS    Renal/GU negative Renal ROS  negative genitourinary   Musculoskeletal  (+) Arthritis ,    Abdominal   Peds  Hematology negative hematology ROS (+)   Anesthesia Other Findings Surgery cancelled on 08/30/19 2/2 T wave inversions after induction. Cardiac workup since then negative.   Reproductive/Obstetrics                             Anesthesia Physical Anesthesia Plan  ASA: 3  Anesthesia Plan: General   Post-op Pain Management: Ofirmev IV (intra-op)*   Induction: Intravenous  PONV Risk Score and Plan: 4 or greater and Dexamethasone, Ondansetron and Treatment may vary due to age or medical condition  Airway Management Planned: Oral ETT  Additional Equipment:   Intra-op Plan:   Post-operative Plan: Extubation in OR  Informed Consent: I have reviewed the patients History and Physical, chart, labs and discussed the procedure including the risks, benefits and alternatives for the proposed anesthesia with the patient or authorized representative who has indicated his/her understanding and acceptance.   Patient has DNR.  Discussed DNR with power of attorney.   Dental advisory given  Plan Discussed with: CRNA  Anesthesia Plan Comments: (Family wishes to allow medical code only. No CPR.)        Anesthesia Quick Evaluation

## 2023-04-19 NOTE — Anesthesia Postprocedure Evaluation (Signed)
Anesthesia Post Note  Patient: Alicia [name]  Procedure(s) Performed: ARTHROPLASTY BIPOLAR HIP (HEMIARTHROPLASTY) POSTERIOR (Left: Hip)     Patient location during evaluation: PACU Anesthesia Type: General Level of consciousness: awake and alert Pain management: pain level controlled Vital Signs Assessment: post-procedure vital signs reviewed and stable Respiratory status: spontaneous breathing, nonlabored ventilation, respiratory function stable and patient connected to nasal cannula oxygen Cardiovascular status: blood pressure returned to baseline and stable Postop Assessment: no apparent nausea or vomiting Anesthetic complications: no  No notable events documented.  Last Vitals:  Vitals:   04/19/23 1930 04/19/23 2010  BP: (!) 155/83 (!) 159/85  Pulse: (!) 55 60  Resp: 12 17  Temp:  36.4 C  SpO2: 92% 98%    Last Pain:  Vitals:   04/19/23 2010  TempSrc: Oral  PainSc:                  [name] S

## 2023-04-19 NOTE — Progress Notes (Signed)
Initial Nutrition Assessment  DOCUMENTATION CODES:   Not applicable  INTERVENTION:  - Add supplements once diet advances.   NUTRITION DIAGNOSIS:   Increased nutrient needs related to hip fracture as evidenced by estimated needs.  GOAL:   Patient will meet greater than or equal to 90% of their needs  MONITOR:   Diet advancement, PO intake  REASON FOR ASSESSMENT:   Consult Hip fracture protocol  ASSESSMENT:   87 y.o. female admits related to leg injury and fall. PMH includes: alzheimer disease, HTN, advanced dementia. Pt is currently receiving medical management related to fall.  Meds reviewed:  Vit D3, remeron, miralax, senokot, SMOG enema. Labs reviewed: WDL.   The pt is NPO and out of room in OR. Per record, pt is disoriented x4. No significant wt loss per record. RD will continue to monitor diet advancement and add supplements once diet is advanced.   NUTRITION - FOCUSED PHYSICAL EXAM:  Pt out of room.   Diet Order:   Diet Order             Diet NPO time specified Except for: Sips with Meds  Diet effective midnight                   EDUCATION NEEDS:   Not appropriate for education at this time  Skin:  Skin Assessment: Reviewed RN Assessment  Last BM:  5/22 - type 6  Height:   Ht Readings from Last 1 Encounters:  04/17/23 5\' 6"  (1.676 m)    Weight:   Wt Readings from Last 1 Encounters:  04/17/23 59 kg    Ideal Body Weight:     BMI:  Body mass index is 20.98 kg/m.  Estimated Nutritional Needs:   Kcal:  1800-2000 kcals  Protein:  90-100 gm  Fluid:  >/= 1.8 L  Bethann Humble, RD, LDN, CNSC.

## 2023-04-19 NOTE — Anesthesia Procedure Notes (Signed)
Procedure Name: Intubation Date/Time: 04/19/2023 5:19 PM  Performed by: Wilder Glade, CRNAPre-anesthesia Checklist: Patient identified, Emergency Drugs available, Suction available, Patient being monitored and Timeout performed Patient Re-evaluated:Patient Re-evaluated prior to induction Oxygen Delivery Method: Circle system utilized Preoxygenation: Pre-oxygenation with 100% oxygen Induction Type: IV induction Ventilation: Mask ventilation without difficulty Laryngoscope Size: Miller and 2 Grade View: Grade I Tube type: Oral Tube size: 6.5 mm Number of attempts: 1 Airway Equipment and Method: Stylet Placement Confirmation: positive ETCO2, ETT inserted through vocal cords under direct vision and breath sounds checked- equal and bilateral Secured at: 20 cm Tube secured with: Tape Dental Injury: Teeth and Oropharynx as per pre-operative assessment

## 2023-04-19 NOTE — Interval H&P Note (Signed)
History and Physical Interval Note:  04/19/2023 4:33 PM  Alicia [name]  has presented today for surgery, with the diagnosis of left femoral neck fracture.  The various methods of treatment have been discussed with the patient and family. After consideration of risks, benefits and other options for treatment, the patient has consented to  Procedure(s): ARTHROPLASTY BIPOLAR HIP (HEMIARTHROPLASTY) POSTERIOR (Left) as a surgical intervention.  The patient's history has been reviewed, patient examined, no change in status, stable for surgery.  I have reviewed the patient's chart and labs.  Questions were answered to the patient's satisfaction.     Eldred Manges

## 2023-04-20 ENCOUNTER — Encounter (HOSPITAL_COMMUNITY): Payer: Self-pay | Admitting: Orthopaedic Surgery

## 2023-04-20 ENCOUNTER — Inpatient Hospital Stay (HOSPITAL_COMMUNITY): Payer: Medicare Other

## 2023-04-20 DIAGNOSIS — R296 Repeated falls: Secondary | ICD-10-CM | POA: Diagnosis not present

## 2023-04-20 DIAGNOSIS — E876 Hypokalemia: Secondary | ICD-10-CM

## 2023-04-20 DIAGNOSIS — S72002P Fracture of unspecified part of neck of left femur, subsequent encounter for closed fracture with malunion: Secondary | ICD-10-CM | POA: Diagnosis not present

## 2023-04-20 DIAGNOSIS — E785 Hyperlipidemia, unspecified: Secondary | ICD-10-CM | POA: Diagnosis not present

## 2023-04-20 DIAGNOSIS — G309 Alzheimer's disease, unspecified: Secondary | ICD-10-CM

## 2023-04-20 DIAGNOSIS — F32A Depression, unspecified: Secondary | ICD-10-CM

## 2023-04-20 DIAGNOSIS — F0280 Dementia in other diseases classified elsewhere without behavioral disturbance: Secondary | ICD-10-CM

## 2023-04-20 DIAGNOSIS — K5900 Constipation, unspecified: Secondary | ICD-10-CM

## 2023-04-20 DIAGNOSIS — W19XXXA Unspecified fall, initial encounter: Secondary | ICD-10-CM | POA: Diagnosis not present

## 2023-04-20 LAB — CBC WITH DIFFERENTIAL/PLATELET
Abs Immature Granulocytes: 0.02 10*3/uL (ref 0.00–0.07)
Basophils Absolute: 0 10*3/uL (ref 0.0–0.1)
Basophils Relative: 0 %
Eosinophils Absolute: 0 10*3/uL (ref 0.0–0.5)
Eosinophils Relative: 0 %
HCT: 39.5 % (ref 36.0–46.0)
Hemoglobin: 13.1 g/dL (ref 12.0–15.0)
Immature Granulocytes: 0 %
Lymphocytes Relative: 9 %
Lymphs Abs: 0.6 10*3/uL — ABNORMAL LOW (ref 0.7–4.0)
MCH: 30.6 pg (ref 26.0–34.0)
MCHC: 33.2 g/dL (ref 30.0–36.0)
MCV: 92.3 fL (ref 80.0–100.0)
Monocytes Absolute: 0.4 10*3/uL (ref 0.1–1.0)
Monocytes Relative: 7 %
Neutro Abs: 5.4 10*3/uL (ref 1.7–7.7)
Neutrophils Relative %: 84 %
Platelets: 204 10*3/uL (ref 150–400)
RBC: 4.28 MIL/uL (ref 3.87–5.11)
RDW: 14.4 % (ref 11.5–15.5)
WBC: 6.4 10*3/uL (ref 4.0–10.5)
nRBC: 0 % (ref 0.0–0.2)

## 2023-04-20 LAB — BASIC METABOLIC PANEL
Anion gap: 11 (ref 5–15)
BUN: 12 mg/dL (ref 8–23)
CO2: 22 mmol/L (ref 22–32)
Calcium: 9.1 mg/dL (ref 8.9–10.3)
Chloride: 104 mmol/L (ref 98–111)
Creatinine, Ser: 0.54 mg/dL (ref 0.44–1.00)
GFR, Estimated: >60 mL/min (ref >60)
Glucose, Bld: 115 mg/dL — ABNORMAL HIGH (ref 70–99)
Potassium: 4.8 mmol/L (ref 3.5–5.1)
Sodium: 137 mmol/L (ref 135–145)

## 2023-04-20 LAB — BRAIN NATRIURETIC PEPTIDE: B Natriuretic Peptide: 56.2 pg/mL (ref 0.0–100.0)

## 2023-04-20 MED ORDER — HYDROCODONE-ACETAMINOPHEN 5-325 MG PO TABS: 1.0000 | ORAL_TABLET | Freq: Four times a day (QID) | ORAL | 0 refills | Status: AC | PRN

## 2023-04-20 MED ORDER — ASPIRIN 325 MG PO TBEC: 325.0000 mg | DELAYED_RELEASE_TABLET | Freq: Every day | ORAL | 0 refills | Status: DC

## 2023-04-20 NOTE — Progress Notes (Signed)
Patient ID: Alicia [name], female   DOB: [birth year], 87 y.o.   MRN: [id number]   Subjective: 1 Day Post-Op Procedure(s) (LRB): ARTHROPLASTY BIPOLAR HIP (HEMIARTHROPLASTY) POSTERIOR (Left) Patient reports pain as not conversant.  Seems to be comfortable  Objective: Vital signs in last 24 hours: Temp:  [97.5 F (36.4 C)-97.9 F (36.6 C)] 97.5 F (36.4 C) (05/23 0533) Pulse Rate:  [54-65] 56 (05/23 0011) Resp:  [12-18] 18 (05/23 0533) BP: (118-186)/(75-99) 122/87 (05/23 0533) SpO2:  [81 %-100 %] 81 % (05/23 0533)  Intake/Output from previous day: 05/22 0701 - 05/23 0700 In: 1573.5 [I.V.:1373.5; IV Piggyback:200] Out: 720 [Urine:570; Blood:150] Intake/Output this shift: No intake/output data recorded.  Recent Labs    04/17/23 1740 04/18/23 0526 04/19/23 0620 04/20/23 0655  HGB 12.6 10.9* 11.4* 13.1   Recent Labs    04/19/23 0620 04/20/23 0655  WBC 4.8 6.4  RBC 3.68* 4.28  HCT 34.3* 39.5  PLT 161 204   Recent Labs    04/19/23 0620 04/20/23 0655  NA 137 137  K 3.9 4.8  CL 105 104  CO2 24 22  BUN 11 12  CREATININE 0.56 0.54  GLUCOSE 92 115*  CALCIUM 8.8* 9.1   No results for input(s): "LABPT", "INR" in the last 72 hours.  Slight blood on aquacell dressing Pelvis Portable  Result Date: 04/19/2023 CLINICAL DATA:  [id number] Post-operative state [id number] EXAM: PORTABLE PELVIS 1-2 VIEWS COMPARISON:  04/17/2023 FINDINGS: Postsurgical changes of left hip arthroplasty. Normal alignment. No evidence of loosening or periprosthetic fracture. Expected soft tissue changes. IMPRESSION: Postsurgical changes of left hip arthroplasty. Normal alignment. No evidence of immediate hardware complication. Electronically Signed   By: Caprice Renshaw M.D.   On: 04/19/2023 18:53    Assessment/Plan: 1 Day Post-Op Procedure(s) (LRB): ARTHROPLASTY BIPOLAR HIP (HEMIARTHROPLASTY) POSTERIOR (Left) Up with therapy, WBAT with walker  Eldred Manges 04/20/2023, 7:56 AM

## 2023-04-20 NOTE — [place] FL2 (Signed)
[place] MEDICAID FL2 LEVEL OF CARE FORM     IDENTIFICATION  Patient Name: Alicia [name] Birthdate: December 21, 1935 Sex: female Admission Date (Current Location): 04/17/2023  [facility] and [place] Number:  Producer, television/film/video and Address:  [facility] [place], [address], [place], [place] [address]      Provider Number: [id number]  Attending Physician Name and Address:  Eldred Manges, MD  Relative Name and Phone Number:  Annalya, Reinsel [facility]) [phone]    Current Level of Care: Hospital Recommended Level of Care: Skilled Nursing Facility Prior Approval Number:    Date Approved/Denied:   PASRR Number: [id number]  Discharge Plan: SNF    Current Diagnoses: Patient Active Problem List   Diagnosis Date Noted   Closed displaced fracture of left femoral neck (HCC) 04/19/2023   Fracture of femoral neck, left (HCC) 04/19/2023   Hip fracture (HCC) 04/17/2023   Syncope and collapse 12/10/2021   Orthostatic hypotension 12/10/2021   [name] 01/17/2019   Spinal stenosis, lumbar region, with neurogenic claudication 10/14/2013    Orientation RESPIRATION BLADDER Height & Weight      (disoriented at baseline)  Normal Incontinent, External catheter Weight: 130 lb (59 kg) Height:  5\' 6"  (167.6 cm)  BEHAVIORAL SYMPTOMS/MOOD NEUROLOGICAL BOWEL NUTRITION STATUS      Incontinent Diet  AMBULATORY STATUS COMMUNICATION OF NEEDS Skin    (limited to tolerance post-op) Verbally (incomprehensible) Surgical wounds (closed incision, left hip)                       Personal Care Assistance Level of Assistance  Bathing, Feeding, Dressing Bathing Assistance: Maximum assistance Feeding assistance: Limited assistance Dressing Assistance: Maximum assistance     Functional Limitations Info  Sight, Hearing, Speech Sight Info: Adequate Hearing Info: Impaired Speech Info: Adequate (clear, incomprehensible)    SPECIAL CARE FACTORS FREQUENCY  PT (By  licensed PT), OT (By licensed OT)     PT Frequency: 5x/week OT Frequency: 5x/week            Contractures Contractures Info: Not present    Additional Factors Info  Code Status, Allergies, Psychotropic Code Status Info: DNR Allergies Info: Contrast Media (Iodinated Contrast Media) Psychotropic Info: mirtazapine (REMERON) tablet 7.5 mg         Current Medications (04/20/2023):  This is the current hospital active medication list Current Facility-Administered Medications  Medication Dose Route Frequency Provider Last Rate Last Admin   acetaminophen (TYLENOL) tablet 500 mg  500 mg Oral Q6H Eldred Manges, MD   500 mg at 04/20/23 1253   aspirin EC tablet 325 mg  325 mg Oral Q breakfast Eldred Manges, MD   325 mg at 04/20/23 0845   bisacodyl (DULCOLAX) suppository 10 mg  10 mg Rectal Daily PRN Marinda Elk, MD       cholecalciferol (VITAMIN D3) 25 MCG (1000 UNIT) tablet 2,000 Units  2,000 Units Oral Daily Marinda Elk, MD   2,000 Units at 04/20/23 0846   docusate sodium (COLACE) capsule 100 mg  100 mg Oral BID Eldred Manges, MD       donepezil (ARICEPT) tablet 5 mg  5 mg Oral Daily Marinda Elk, MD   5 mg at 04/20/23 0846   haloperidol lactate (HALDOL) injection 2 mg  2 mg Intravenous Q6H PRN Marinda Elk, MD       HYDROcodone-acetaminophen (NORCO/VICODIN) 5-325 MG per tablet 1 tablet  1 tablet Oral Q6H  PRN Marinda Elk, MD       HYDROcodone-acetaminophen (NORCO/VICODIN) 5-325 MG per tablet 1-2 tablet  1-2 tablet Oral Q4H PRN Eldred Manges, MD   1 tablet at 04/20/23 0845   melatonin tablet 3 mg  3 mg Oral QHS Marinda Elk, MD   3 mg at 04/19/23 2013   memantine (NAMENDA) tablet 10 mg  10 mg Oral BID Marinda Elk, MD   10 mg at 04/20/23 0845   menthol-cetylpyridinium (CEPACOL) lozenge 3 mg  1 lozenge Oral PRN Eldred Manges, MD       Or   phenol (CHLORASEPTIC) mouth spray 1 spray  1 spray Mouth/Throat PRN Eldred Manges, MD        metoCLOPramide (REGLAN) tablet 5-10 mg  5-10 mg Oral Q8H PRN Eldred Manges, MD       Or   metoCLOPramide (REGLAN) injection 5-10 mg  5-10 mg Intravenous Q8H PRN Eldred Manges, MD       mirtazapine (REMERON) tablet 7.5 mg  7.5 mg Oral QHS Lynden Oxford M, MD   7.5 mg at 04/19/23 2013   morphine (PF) 2 MG/ML injection 0.5 mg  0.5 mg Intravenous Q4H PRN Marinda Elk, MD   0.5 mg at 04/18/23 0234   morphine (PF) 2 MG/ML injection 0.5-1 mg  0.5-1 mg Intravenous Q2H PRN Eldred Manges, MD       ondansetron [facility]) tablet 4 mg  4 mg Oral Q6H PRN Eldred Manges, MD       Or   ondansetron [facility]) injection 4 mg  4 mg Intravenous Q6H PRN Eldred Manges, MD       polyethylene glycol (MIRALAX / GLYCOLAX) packet 17 g  17 g Oral BID Marinda Elk, MD       QUEtiapine (SEROQUEL) tablet 25 mg  25 mg Oral QHS PRN Marinda Elk, MD   25 mg at 04/18/23 2019   rosuvastatin (CRESTOR) tablet 5 mg  5 mg Oral Daily Marinda Elk, MD   5 mg at 04/20/23 1610   senna-docusate (Senokot-S) tablet 2 tablet  2 tablet Oral QHS Marinda Elk, MD   2 tablet at 04/19/23 2013   sertraline (ZOLOFT) tablet 25 mg  25 mg Oral Daily Rolly Salter, MD   25 mg at 04/20/23 0845   sorbitol, milk of mag, mineral oil, glycerin (SMOG) enema  960 mL Rectal Once Marinda Elk, MD         Discharge Medications: Please see discharge summary for a list of discharge medications.  Relevant Imaging Results:  Relevant Lab Results:   Additional Information [id number]  Temisha Murley A [place], LCSWA

## 2023-04-20 NOTE — Progress Notes (Signed)
Palliative Medicine Progress Note   Patient Name: Alicia [name]       Date: 04/20/2023 DOB: 10/26/1936  Age: 87 y.o. MRN#: [id number] Attending Physician: Eldred Manges, MD Primary Care Physician: [name], MD Admit Date: 04/17/2023    HPI/Patient Profile: 87 y.o. female  with past medical history of advanced dementia who presented to [facility] ED from memory care unit on 04/17/2023 after a mechanical fall and was found to have a left femoral neck fracture.  She was transferred to [facility] on 04/18/2023 and underwent left hip arthroplasty on 04/20/2023.   Palliative Medicine was consulted for goals of care discussions in the setting of advanced dementia, frequent falls, and possible prognosis of less than 6 months.   Subjective: Chart reviewed and patient assessed at bedside. She is 1 day post-op from left hip arthroplasty. She is alert and OOB to the recliner. She is mildly restless, but denies pain. She attempts to answer questions, but her speech is mostly nonsensical and husband/[name] states this is her baseline.   Reviewed with husband scope of treatment concepts including code status, medical ventilation, antibiotics, artifical feeding and hydration, and rehospitalization.   We completed a MOST form today. Husband has outlined his preferences for the following treatment decisions:  Cardiopulmonary Resuscitation: Do Not Attempt Resuscitation (DNR/No CPR)  Medical Interventions: Limited Additional Interventions: Use medical treatment, IV fluids and cardiac monitoring as indicated, DO NOT USE intubation or mechanical ventilation. May consider use of less invasive airway support such as BiPAP or CPAP. Also provide comfort measures. Transfer to the hospital if indicated. Avoid intensive care.    Antibiotics: Antibiotics if indicated  IV Fluids: IV fluids if indicated  Feeding Tube: No feeding tube   Discussed the importance of continued conversation with family and the medical team regarding overall plan of care and treatment options.   Objective:  Physical Exam Vitals reviewed.  Constitutional:      General: She is not in acute distress.    Comments: Frail and chronically ill-appearing restless  Pulmonary:     Effort: Pulmonary effort is normal.  Neurological:     Mental Status: She is alert. Mental status is at baseline. She is confused.  Psychiatric:        Cognition and Memory: Cognition is impaired. Memory is impaired.  Vital Signs: BP 131/63 (BP Location: Right Arm)   Pulse (!) 56   Temp (!) 97.5 F (36.4 C) (Oral)   Resp 19   Ht 5\' 6"  (1.676 m)   Wt 59 kg   SpO2 97%   BMI 20.98 kg/m  SpO2: SpO2: 97 % O2 Device: O2 Device: Room Air   Palliative Medicine Assessment & Plan   Assessment: Principal Problem:   Hip fracture (HCC) Active Problems:   [name]   Closed displaced fracture of left femoral neck (HCC)   Fracture of femoral neck, left (HCC)    Recommendations/Plan: MOST form completed - original should go in discharge packet and copy has been made to scan into EMR Goal of care - rehab to improve functional status, with hope that patient can regain ability to ambulate Consider increasing mirtazapine to 15 mg at bedtime if restlessness persists (may also help with appetite) PMT will continue to follow  Code Status: DNR/DNI   Prognosis:  To be determined, but < 6 months may not be surprising  Discharge Planning: SNF/rehab   Thank you for allowing the Palliative Medicine Team to assist in the care of this patient.   MDM - High   Merry Proud, NP   Please contact Palliative Medicine Team phone at [phone] for questions and concerns.  For individual providers, please see AMION.

## 2023-04-20 NOTE — Progress Notes (Signed)
Patient dressing change performed at this time. Patient tolerated it well.

## 2023-04-20 NOTE — TOC Initial Note (Addendum)
Transition of Care [facility]) - Initial/Assessment Note    Patient Details  Name: Alicia [name] MRN: [id number] Date of Birth: 1936-01-13  Transition of Care [facility]) CM/SW Contact:    Mashayla Lavin A [place], Theresia Majors Phone Number: 04/20/2023, 3:56 PM  Clinical Narrative:                  CSW met with pt and pt's spouse at bedside. Pt was agreeable to SNF. SNF work up completed. Pt to discharge to SNF once medically stable and bed offer choice is made with insurance authorized.   TOC will continue to follow.   Expected Discharge Plan: Skilled Nursing Facility Barriers to Discharge: Continued Medical Work up, SNF Pending bed offer, Insurance Authorization   Patient Goals and CMS Choice Patient states their goals for this hospitalization and ongoing recovery are:: get back home          Expected Discharge Plan and Services In-house Referral: Clinical Social Work     Living arrangements for the past 2 months:  (Memory Care)                                      Prior Living Arrangements/Services Living arrangements for the past 2 months:  (Memory Care) Lives with:: Facility Resident, Spouse          Need for Family Participation in Patient Care: Yes (Comment) Care giver support system in place?: Yes (comment)      Activities of Daily Living Home Assistive Devices/Equipment: None ADL Screening (condition at time of admission) Patient's cognitive ability adequate to safely complete daily activities?: No Is the patient deaf or have difficulty hearing?: Yes Does the patient have difficulty seeing, even when wearing glasses/contacts?: No Does the patient have difficulty concentrating, remembering, or making decisions?: Yes Does the patient have difficulty dressing or bathing?: Yes Independently performs ADLs?: No Communication: Needs assistance Is this a change from baseline?: Pre-admission baseline Dressing (OT): Needs assistance Is this a change from baseline?:  Pre-admission baseline Grooming: Needs assistance Is this a change from baseline?: Pre-admission baseline Feeding: Needs assistance Is this a change from baseline?: Pre-admission baseline Bathing: Needs assistance Is this a change from baseline?: Pre-admission baseline Toileting: Needs assistance Is this a change from baseline?: Pre-admission baseline In/Out Bed: Needs assistance Is this a change from baseline?: Pre-admission baseline Walks in Home: Independent Does the patient have difficulty walking or climbing stairs?: Yes Weakness of Legs: Left Weakness of Arms/Hands: None  Permission Sought/Granted                  Emotional Assessment Appearance:: Appears stated age Attitude/Demeanor/Rapport: Engaged Affect (typically observed): Appropriate Orientation: : Disoriented Alcohol / Substance Use: Not Applicable Psych Involvement: No (comment)  Admission diagnosis:  Hip fracture (HCC) [S72.009A] Fall, initial encounter [W19.XXXA] Closed left hip fracture, initial encounter (HCC) [S72.002A] Fracture of femoral neck, left (HCC) [S72.002A] Patient Active Problem List   Diagnosis Date Noted   Closed displaced fracture of left femoral neck (HCC) 04/19/2023   Fracture of femoral neck, left (HCC) 04/19/2023   Hip fracture (HCC) 04/17/2023   Syncope and collapse 12/10/2021   Orthostatic hypotension 12/10/2021   [name] 01/17/2019   Spinal stenosis, lumbar region, with neurogenic claudication 10/14/2013    Class: Diagnosis of   PCP:  [name], MD Pharmacy:   [facility] - [place], [place] - [address] [employer]  [address] [place] [address] Phone: [phone] Fax: [phone]     Social Determinants of Health (SDOH) Social History: SDOH Screenings   Food Insecurity: Patient Unable To Answer (04/18/2023)  Transportation Needs: Patient Unable To Answer (04/18/2023)  Utilities: Patient  Unable To Answer (04/18/2023)  Tobacco Use: Medium Risk (04/20/2023)   SDOH Interventions:     Readmission Risk Interventions     No data to display

## 2023-04-20 NOTE — Discharge Instructions (Signed)

## 2023-04-20 NOTE — Evaluation (Signed)
Occupational Therapy Evaluation Patient Details Name: Alicia [name] MRN: [id number] DOB: 1936/01/01 Today's Date: 04/20/2023   History of Present Illness 87 yo female presents to [facility] from SNF memory care unit on 5/20 with L hip fx from fall. S/p L hip hemiarthroplasty posterior approach on 5/22. PMH includes dementia, spinal stenosis, syncope.   Clinical Impression   Patient admitted for the diagnosis above.  PTA she resides at a local memory care facility, needing assist for ADL and supervision for mobility.  Currently patient is needing +2 Mod A for mobility due to cognitive dysfunction and safety.  In addition she is needing up to Total assist for lower body ADL due to hip precautions and KI in place.  OT will follow in the acute setting, and given prior ability to mobilize without an AD, Patient will benefit from continued inpatient follow up therapy, <3 hours/day to regain mobility.  Patient does demonstrate the ability to mobilize, but will need oversight due to posterior hip precautions.         Recommendations for follow up therapy are one component of a multi-disciplinary discharge planning process, led by the attending physician.  Recommendations may be updated based on patient status, additional functional criteria and insurance authorization.   Assistance Recommended at Discharge Frequent or constant Supervision/Assistance  Patient can return home with the following Two people to help with walking and/or transfers;A lot of help with bathing/dressing/bathroom;Assistance with feeding    Functional Status Assessment  Patient has had a recent decline in their functional status and demonstrates the ability to make significant improvements in function in a reasonable and predictable amount of time.  Equipment Recommendations  None recommended by OT    Recommendations for Other Services       Precautions / Restrictions Precautions Precautions: Fall;Posterior Hip Precaution  Comments: advanced dementia; PT verbally reviewed posterior hip precautions with pt and husband, L KI in place for precautions maintenance, and pillow utilized as abduction pillow once in recliner Restrictions Weight Bearing Restrictions: No      Mobility Bed Mobility Overal bed mobility: Needs Assistance Bed Mobility: Supine to Sit, Sit to Supine     Supine to sit: Mod assist Sit to supine: Min assist, HOB elevated        Transfers Overall transfer level: Needs assistance Equipment used: Rolling walker (2 wheels), 2 person hand held assist Transfers: Sit to/from Stand, Bed to chair/wheelchair/BSC Sit to Stand: Mod assist, +2 physical assistance, From elevated surface     Step pivot transfers: Mod assist, +2 physical assistance            Balance Overall balance assessment: Needs assistance, History of Falls Sitting-balance support: No upper extremity supported, Feet supported Sitting balance-Leahy Scale: Fair     Standing balance support: Bilateral upper extremity supported, During functional activity Standing balance-Leahy Scale: Poor                             ADL either performed or assessed with clinical judgement   ADL Overall ADL's : Needs assistance/impaired Eating/Feeding: Minimal assistance;Bed level   Grooming: Wash/dry hands;Wash/dry face;Minimal assistance;Bed level   Upper Body Bathing: Moderate assistance;Bed level   Lower Body Bathing: Total assistance;Bed level   Upper Body Dressing : Moderate assistance;Bed level   Lower Body Dressing: Total assistance;Bed level   Toilet Transfer: Moderate assistance;BSC/3in1;+2 for physical assistance   Toileting- Clothing Manipulation and Hygiene: Total assistance;Sit to/from stand  Vision   Vision Assessment?: No apparent visual deficits     Perception     Praxis      Pertinent Vitals/Pain Pain Assessment Pain Assessment: Faces Faces Pain Scale: Hurts a little  bit Pain Location: L hip, during mobility Pain Descriptors / Indicators: Grimacing, Restless Pain Intervention(s): Monitored during session     Hand Dominance Right   Extremity/Trunk Assessment Upper Extremity Assessment Upper Extremity Assessment: Overall WFL for tasks assessed   Lower Extremity Assessment Lower Extremity Assessment: Defer to PT evaluation LLE Deficits / Details: post-operative guarding, able to perform AAROM heel slide, LLE lift with PT assist   Cervical / Trunk Assessment Cervical / Trunk Assessment: Kyphotic   Communication Communication Communication: Expressive difficulties;Receptive difficulties   Cognition Arousal/Alertness: Awake/alert Behavior During Therapy: Restless, Anxious Overall Cognitive Status: History of cognitive impairments - at baseline                                 General Comments: minimal command following, a lot of tactile cues for standing and sitting..     General Comments   VSS on RA    Exercises     Shoulder Instructions      Home Living Family/patient expects to be discharged to:: Other (Comment)                                 Additional Comments: memory care at [facility]      Prior Functioning/Environment Prior Level of Function : Needs assist  Cognitive Assist : Mobility (cognitive);ADLs (cognitive)           Mobility Comments: ambulates without AD, per pt's husband they have been trying to get her to use a RW but she finds it confusing. multiple recent falls ADLs Comments: per pt's hsuband, pt requires assist with ADLs "sometimes"        OT Problem List: Decreased range of motion;Impaired balance (sitting and/or standing);Decreased knowledge of precautions;Decreased knowledge of use of DME or AE;Decreased safety awareness;Decreased cognition;Pain      OT Treatment/Interventions: Self-care/ADL training;Therapeutic activities;Patient/family education;Balance training;DME  and/or AE instruction    OT Goals(Current goals can be found in the care plan section) Acute Rehab OT Goals Patient Stated Goal: Spouse is hoping for ST Rehab to address mobility OT Goal Formulation: With patient/family Time For Goal Achievement: 05/04/23 Potential to Achieve Goals: Fair ADL Goals Pt Will Perform Eating: with set-up;sitting Pt Will Perform Grooming: with set-up;sitting Pt Will Perform Upper Body Bathing: with supervision;sitting Pt Will Perform Upper Body Dressing: with supervision;sitting Pt Will Transfer to Toilet: with min assist;stand pivot transfer;bedside commode  OT Frequency: Min 2X/week    Co-evaluation              AM-PAC OT "6 Clicks" Daily Activity     Outcome Measure Help from another person eating meals?: A Little Help from another person taking care of personal grooming?: A Little Help from another person toileting, which includes using toliet, bedpan, or urinal?: Total Help from another person bathing (including washing, rinsing, drying)?: A Lot Help from another person to put on and taking off regular upper body clothing?: A Lot Help from another person to put on and taking off regular lower body clothing?: Total 6 Click Score: 12   End of Session Equipment Utilized During Treatment: Gait belt Nurse Communication: Mobility status  Activity Tolerance: Patient tolerated  treatment well Patient left: in chair;with call bell/phone within reach;with chair alarm set;with family/visitor present  OT Visit Diagnosis: Unsteadiness on feet (R26.81);Pain Pain - Right/Left: Left Pain - part of body: Leg;Hip                Time: 1610-9604 OT Time Calculation (min): 14 min Charges:  OT General Charges $OT Visit: 1 Visit OT Evaluation $OT Eval Moderate Complexity: 1 Mod  04/20/2023  RP, OTR/L  Acute Rehabilitation Services  Office:  [phone]   Suzanna Obey 04/20/2023, 10:43 AM

## 2023-04-20 NOTE — Progress Notes (Signed)
PROGRESS NOTE    Alicia [name]  ZOX:[id number] DOB: 08-01-1936 DOA: 04/17/2023 PCP: [name], MD    Chief Complaint  Patient presents with   Leg Injury   Fall    Brief Narrative:  Patient is a 87 year old female history of advanced dementia presented from memory care unit following mechanical fall.  Workup in the ED concerning for left hip fracture.  Orthopedics consulted, patient transferred to [facility] to be assessed by her primary orthopedic surgeon, Dr. [name].  Patient subsequently underwent left bipolar hip hemiarthroplasty 04/19/2023.  Patient also noted to be constipated and placed on a bowel regimen.   Assessment & Plan:   Principal Problem:   Hip fracture (HCC) Active Problems:   [name]   Closed displaced fracture of left femoral neck (HCC)   Fracture of femoral neck, left (HCC)   Hyperlipidemia   Depression   Recurrent falls   Constipation   Hypokalemia  #1 left femoral neck fracture -Secondary to mechanical fall. -Patient seen in consultation by orthopedics and patient underwent a left hip bipolar hemiarthroplasty for femoral neck fracture per orthopedics, Dr. [name]. -PT/OT. -Per orthopedics WBAT with walker. -Pain management, DVT prophylaxis per orthopedics. -Likely needs SNF placement. -Per orthopedics.  2.  Mild hypokalemia -Repleted.  3.  Hyperlipidemia -Continue statin.  4.  Iron deficiency anemia -H&H currently stable. -May consider oral iron supplementation on discharge. -Follow.  5.  Dementia/depression -Currently stable. -Continue Aricept, Namenda, Remeron, Zoloft. -Seroquel nightly as needed agitation.  6.  Constipation -Significant stool burden seen on CT abdomen. -Status post fleets enema x 1. -Patient with response to enema. -Continue current bowel regimen of Senokot-S twice daily, MiraLAX twice daily.  7.  Recurrent falls -Patient with multiple falls over the past several days prior to  admission and recently evaluated by PT on 517 when she presented to the ED, SNF recommended however family refused. -Patient seen by PT OT likely require SNF. -TOC consultation placed.    DVT prophylaxis: Postop DVT prophylaxis per orthopedics. Code Status: DNR Family Communication: Updated patient, husband at bedside. Disposition: Likely SNF  Status is: Inpatient Remains inpatient appropriate because: Severity of illness.  Unsafe disposition   Consultants:  Orthopedics: Dr. [name] 04/18/2023 Palliative care: Dr. Patterson Hammersmith 04/18/2023  Procedures:  CT left hip 04/17/2023 CT head 04/17/2023 Chest x-ray 04/20/2023 Plain films of the pelvis 04/19/2023 Plain films of the left tib-fib 04/17/2023 Plain films of the left femur 04/17/2023 Plain films of bilateral hips and pelvis 04/17/2023. Left hip bipolar hemiarthroplasty for femoral neck fracture per orthopedics: Dr. [name] 04/19/2023  Antimicrobials:  Anti-infectives (From admission, onward)    Start     Dose/Rate Route Frequency Ordered Stop   04/19/23 0600  ceFAZolin (ANCEF) IVPB 2g/100 mL premix        2 g 200 mL/hr over 30 Minutes Intravenous On call to O.R. 04/18/23 1436 04/19/23 1749         Subjective: Sitting up getting fed breakfast and eating pancakes.  Husband at bedside.  Patient minimally conversant.  Objective: Vitals:   04/19/23 2010 04/20/23 0011 04/20/23 0533 04/20/23 0823  BP: (!) 159/85 126/75 122/87 131/63  Pulse: 60 (!) 56    Resp: 17 17 18 19   Temp: 97.6 F (36.4 C) (!) 97.5 F (36.4 C) (!) 97.5 F (36.4 C) (!) 97.5 F (36.4 C)  TempSrc: Oral Oral Oral Oral  SpO2: 98% 90% (!) 81% 97%  Weight:      Height:  Intake/Output Summary (Last 24 hours) at 04/20/2023 1713 Last data filed at 04/20/2023 0846 Gross per 24 hour  Intake 1573.49 ml  Output 1020 ml  Net 553.49 ml   Filed Weights   04/17/23 1600  Weight: 59 kg    Examination:  General exam: Appears calm and comfortable  Respiratory system:  Clear to auscultation anterior lung fields.  No wheezes, no crackles, no rhonchi.  Fair air movement.Marland Kitchen Respiratory effort normal. Cardiovascular system: S1 & S2 heard, RRR. No JVD, murmurs, rubs, gallops or clicks. No pedal edema. Gastrointestinal system: Abdomen is nondistended, soft and nontender. No organomegaly or masses felt. Normal bowel sounds heard. Central nervous system: Alert and oriented. No focal neurological deficits. Extremities: Left lower extremity in knee immobilizer.  Skin: No rashes, lesions or ulcers Psychiatry: Judgement and insight appear poor. Mood & affect appropriate.     Data Reviewed: I have personally reviewed following labs and imaging studies  CBC: Recent Labs  Lab 04/14/23 0905 04/17/23 1740 04/18/23 0526 04/19/23 0620 04/20/23 0655  WBC 8.1 7.7 6.3 4.8 6.4  NEUTROABS 6.6 5.7  --   --  5.4  HGB 13.3 12.6 10.9* 11.4* 13.1  HCT 40.6 38.8 33.3* 34.3* 39.5  MCV 95.5 94.9 95.7 93.2 92.3  PLT 195 166 136* 161 204    Basic Metabolic Panel: Recent Labs  Lab 04/14/23 0905 04/17/23 1740 04/18/23 0526 04/19/23 0620 04/20/23 0655  NA 139 139 137 137 137  K 3.7 3.5 3.1* 3.9 4.8  CL 105 103 109 105 104  CO2 27 28 22 24 22   GLUCOSE 105* 100* 91 92 115*  BUN 20 18 11 11 12   CREATININE 0.80 0.59 0.44 0.56 0.54  CALCIUM 9.2 9.0 7.5* 8.8* 9.1    GFR: Estimated Creatinine Clearance: 48.8 mL/min (by C-G formula based on SCr of 0.54 mg/dL).  Liver Function Tests: Recent Labs  Lab 04/17/23 1740  AST 18  ALT 15  ALKPHOS 50  BILITOT 1.7*  PROT 7.2  ALBUMIN 3.7    CBG: Recent Labs  Lab 04/19/23 0459  GLUCAP 91     Recent Results (from the past 240 hour(s))  Surgical pcr screen     Status: None   Collection Time: 04/19/23  4:51 AM   Specimen: Nasal Mucosa; Nasal Swab  Result Value Ref Range Status   MRSA, PCR NEGATIVE NEGATIVE Final   Staphylococcus aureus NEGATIVE NEGATIVE Final    Comment: (NOTE) The Xpert SA Assay (FDA approved for  NASAL specimens in patients 7 years of age and older), is one component of a comprehensive surveillance program. It is not intended to diagnose infection nor to guide or monitor treatment. Performed at [facility], [address] [place], [place] [address]          Radiology Studies: DG CHEST PORT 1 VIEW  Result Date: 04/20/2023 CLINICAL DATA:  Hypoxia EXAM: PORTABLE CHEST 1 VIEW COMPARISON:  Chest radiograph dated 04/14/2023 FINDINGS: Normal lung volumes. Left basilar linear atelectasis/scarring. No focal consolidations. No pleural effusion or pneumothorax. The heart size and mediastinal contours are within normal limits. No acute osseous abnormality. IMPRESSION: Left basilar linear atelectasis/scarring. No focal consolidations. Electronically Signed   By: Agustin Cree M.D.   On: 04/20/2023 08:51   Pelvis Portable  Result Date: 04/19/2023 CLINICAL DATA:  [id number] Post-operative state [id number] EXAM: PORTABLE PELVIS 1-2 VIEWS COMPARISON:  04/17/2023 FINDINGS: Postsurgical changes of left hip arthroplasty. Normal alignment. No evidence of loosening or periprosthetic fracture. Expected soft tissue changes.  IMPRESSION: Postsurgical changes of left hip arthroplasty. Normal alignment. No evidence of immediate hardware complication. Electronically Signed   By: Caprice Renshaw M.D.   On: 04/19/2023 18:53        Scheduled Meds:  acetaminophen  500 mg Oral Q6H   aspirin EC  325 mg Oral Q breakfast   cholecalciferol  2,000 Units Oral Daily   docusate sodium  100 mg Oral BID   donepezil  5 mg Oral Daily   melatonin  3 mg Oral QHS   memantine  10 mg Oral BID   mirtazapine  7.5 mg Oral QHS   polyethylene glycol  17 g Oral BID   rosuvastatin  5 mg Oral Daily   senna-docusate  2 tablet Oral QHS   sertraline  25 mg Oral Daily   sorbitol, milk of mag, mineral oil, glycerin (SMOG) enema  960 mL Rectal Once   Continuous Infusions:   LOS: 3 days    Time spent: 35 minutes    Ramiro Harvest, MD [facility]   To contact the attending provider between 7A-7P or the covering provider during after hours 7P-7A, please log into the web site [email or web address] and access using universal [place] password for that web site. If you do not have the password, please call the hospital operator.  04/20/2023, 5:13 PM

## 2023-04-20 NOTE — Evaluation (Signed)
Physical Therapy Evaluation Patient Details Name: Alicia [name] MRN: [id number] DOB: 04-29-1936 Today's Date: 04/20/2023  History of Present Illness  87 yo female presents to [facility] from SNF memory care unit on 5/20 with L hip fx from fall. S/p L hip hemiarthroplasty posterior approach on 5/22. PMH includes dementia, spinal stenosis, syncope.  Clinical Impression   Pt presents with generalized weakness, L hip pain post-operatively, impaired balance with history of falls, impaired activity tolerance, and decreased knowledge of hip precautions with history of dementia. Pt to benefit from acute PT to address deficits. Pt tolerated repeated stand pivot transfers during session, overall requiring mod +2 assist for mobility mostly limited by pain and anxiety. Pt would benefit from post-acute rehab. PT to progress mobility as tolerated, and will continue to follow acutely.         Recommendations for follow up therapy are one component of a multi-disciplinary discharge planning process, led by the attending physician.  Recommendations may be updated based on patient status, additional functional criteria and insurance authorization.  Follow Up Recommendations Can patient physically be transported by private vehicle: Yes     Assistance Recommended at Discharge Frequent or constant Supervision/Assistance  Patient can return home with the following  A lot of help with walking and/or transfers;A lot of help with bathing/dressing/bathroom;Assistance with cooking/housework;Assistance with feeding;Direct supervision/assist for medications management;Direct supervision/assist for financial management;Assist for transportation;Help with stairs or ramp for entrance    Equipment Recommendations None recommended by PT  Recommendations for Other Services       Functional Status Assessment Patient has had a recent decline in their functional status and demonstrates the ability to make significant improvements  in function in a reasonable and predictable amount of time.     Precautions / Restrictions Precautions Precautions: Fall;Posterior Hip Precaution Comments: advanced dementia; verbally reviewed posterior hip precautions with pt and husband, L KI in place for precautions maintenance and pillow utilized as abduction pillow once in recliner Restrictions Weight Bearing Restrictions: No      Mobility  Bed Mobility Overal bed mobility: Needs Assistance Bed Mobility: Supine to Sit, Sit to Supine     Supine to sit: Mod assist     General bed mobility comments: assist for trunk elevation, LE progression to EOB.    Transfers Overall transfer level: Needs assistance Equipment used: Rolling walker (2 wheels), 2 person hand held assist Transfers: Sit to/from Stand, Bed to chair/wheelchair/BSC Sit to Stand: Mod assist, +2 physical assistance, From elevated surface   Step pivot transfers: Mod assist, +2 physical assistance       General transfer comment: assist for rise, steady, and pivotal steps to and from [facility]. stand x3, from EOB x2 and BSC x1.    Ambulation/Gait               General Gait Details: nt - pt limited in tolerance post-op  Stairs            Wheelchair Mobility    Modified Rankin (Stroke Patients Only)       Balance Overall balance assessment: Needs assistance, History of Falls Sitting-balance support: No upper extremity supported, Feet supported Sitting balance-Leahy Scale: Fair Sitting balance - Comments: can sit EOB without PT support, preference for posterior bias   Standing balance support: Bilateral upper extremity supported, During functional activity Standing balance-Leahy Scale: Poor Standing balance comment: reliant on external support  Pertinent Vitals/Pain Pain Assessment Pain Assessment: Faces Faces Pain Scale: Hurts a little bit Pain Location: L hip, during mobility Pain Descriptors /  Indicators: Discomfort, Sore, Grimacing Pain Intervention(s): Monitored during session, Limited activity within patient's tolerance, Repositioned    Home Living Family/patient expects to be discharged to:: Other (Comment)                   Additional Comments: memory care at [facility]    Prior Function Prior Level of Function : Needs assist             Mobility Comments: ambulates without AD, per pt's husband they have been trying to get her to use a RW but she finds it confusing. multiple recent falls ADLs Comments: per pt's hsuband, pt requires assist with ADLs "sometimes"     Hand Dominance   Dominant Hand: Right    Extremity/Trunk Assessment   Upper Extremity Assessment Upper Extremity Assessment: Defer to OT evaluation    Lower Extremity Assessment Lower Extremity Assessment: Generalized weakness;LLE deficits/detail LLE Deficits / Details: post-operative guarding, able to perform AAROM heel slide, LLE lift with PT assist    Cervical / Trunk Assessment Cervical / Trunk Assessment: Kyphotic  Communication   Communication: Expressive difficulties;Receptive difficulties (limited verbalization)  Cognition Arousal/Alertness: Awake/alert Behavior During Therapy: Anxious Overall Cognitive Status: History of cognitive impairments - at baseline                                 General Comments: history of dementia, inconsistently responds yes/no, is mostly nonverbal at baseline. follows some simple commands with multimodal cuing        General Comments      Exercises     Assessment/Plan    PT Assessment Patient needs continued PT services  PT Problem List Decreased strength;Decreased activity tolerance;Decreased balance;Decreased mobility;Decreased cognition;Decreased knowledge of use of DME;Decreased safety awareness;Decreased knowledge of precautions       PT Treatment Interventions DME instruction;Gait training;Functional mobility  training;Therapeutic activities;Therapeutic exercise;Balance training;Neuromuscular re-education;Patient/family education;Cognitive remediation;Wheelchair mobility training    PT Goals (Current goals can be found in the Care Plan section)  Acute Rehab PT Goals PT Goal Formulation: With patient/family Time For Goal Achievement: 05/04/23 Potential to Achieve Goals: Good    Frequency Min 2X/week     Co-evaluation               AM-PAC PT "6 Clicks" Mobility  Outcome Measure Help needed turning from your back to your side while in a flat bed without using bedrails?: A Lot Help needed moving from lying on your back to sitting on the side of a flat bed without using bedrails?: A Lot Help needed moving to and from a bed to a chair (including a wheelchair)?: A Lot Help needed standing up from a chair using your arms (e.g., wheelchair or bedside chair)?: A Lot Help needed to walk in hospital room?: Total Help needed climbing 3-5 steps with a railing? : Total 6 Click Score: 10    End of Session Equipment Utilized During Treatment: Gait belt;Left knee immobilizer Activity Tolerance: Patient tolerated treatment well Patient left: with call bell/phone within reach;with family/visitor present;in chair;with chair alarm set Nurse Communication: Mobility status PT Visit Diagnosis: Other abnormalities of gait and mobility (R26.89);Repeated falls (R29.6);Pain Pain - Right/Left: Left Pain - part of body: Hip    Time: 0925-1000 PT Time Calculation (min) (ACUTE ONLY): 35 min   Charges:  PT Evaluation $PT Eval Low Complexity: 1 Low          Jessicaann Overbaugh S, PT DPT Acute Rehabilitation Services Secure Chat Preferred  Office [phone]   Truddie Coco 04/20/2023, 10:37 AM

## 2023-04-20 NOTE — TOC Progression Note (Signed)
Transition of Care [facility]) - Progression Note    Patient Details  Name: Alicia [name] MRN: [id number] Date of Birth: Nov 05, 1936  [facility] of [facility] CM/SW Contact  Loree Shehata A [place], [place] Phone Number: 04/20/2023, 1:33 PM  Clinical Narrative:     CSW contacted pt's husband regarding discharge planning.   There was no answer, CSW left voicemail with contact information to reach back to CSW  CSW will follow up at another more opportune time.      Barriers to Discharge: Continued Medical Work up  Expected Discharge Plan and Services                                               Social Determinants of Health (SDOH) Interventions SDOH Screenings   Food Insecurity: Patient Unable To Answer (04/18/2023)  Transportation Needs: Patient Unable To Answer (04/18/2023)  Utilities: Patient Unable To Answer (04/18/2023)  Tobacco Use: Medium Risk (04/20/2023)    Readmission Risk Interventions     No data to display

## 2023-04-21 DIAGNOSIS — S72002P Fracture of unspecified part of neck of left femur, subsequent encounter for closed fracture with malunion: Secondary | ICD-10-CM | POA: Diagnosis not present

## 2023-04-21 DIAGNOSIS — E785 Hyperlipidemia, unspecified: Secondary | ICD-10-CM | POA: Diagnosis not present

## 2023-04-21 DIAGNOSIS — G309 Alzheimer's disease, unspecified: Secondary | ICD-10-CM | POA: Diagnosis not present

## 2023-04-21 DIAGNOSIS — F32A Depression, unspecified: Secondary | ICD-10-CM | POA: Diagnosis not present

## 2023-04-21 LAB — MAGNESIUM: Magnesium: 2 mg/dL (ref 1.7–2.4)

## 2023-04-21 MED ORDER — ENSURE ENLIVE PO LIQD
237.0000 mL | Freq: Two times a day (BID) | ORAL | Status: DC
  Administered 2023-04-22 – 2023-04-25 (×5): 237 mL via ORAL

## 2023-04-21 MED ORDER — ADULT MULTIVITAMIN W/MINERALS CH
1.0000 | ORAL_TABLET | Freq: Every day | ORAL | Status: DC
  Administered 2023-04-21 – 2023-04-26 (×6): 1 via ORAL
  Filled 2023-04-21 (×6): qty 1

## 2023-04-21 NOTE — Care Management Important Message (Signed)
Important Message  Patient Details  Name: Alicia [name] MRN: [id number] Date of Birth: June 13, 1936   Medicare Important Message Given:  Yes     Ermin Parisien Stefan [place] 04/21/2023, 2:53 PM

## 2023-04-21 NOTE — Progress Notes (Signed)
PROGRESS NOTE    Alicia [name]  ZOX:[id number] DOB: 1936/01/16 DOA: 04/17/2023 PCP: [name], MD    Chief Complaint  Patient presents with   Leg Injury   Fall    Brief Narrative:  Patient is a 87 year old female history of advanced dementia presented from memory care unit following mechanical fall.  Workup in the ED concerning for left hip fracture.  Orthopedics consulted, patient transferred to [facility] to be assessed by her primary orthopedic surgeon, Dr. [name].  Patient subsequently underwent left bipolar hip hemiarthroplasty 04/19/2023.  Patient also noted to be constipated and placed on a bowel regimen.   Assessment & Plan:   Principal Problem:   Hip fracture (HCC) Active Problems:   [name]   Closed displaced fracture of left femoral neck (HCC)   Fracture of femoral neck, left (HCC)   Hyperlipidemia   Depression   Recurrent falls   Constipation   Hypokalemia  #1 left femoral neck fracture -Secondary to mechanical fall. -Patient seen in consultation by orthopedics and patient underwent a left hip bipolar hemiarthroplasty for femoral neck fracture per orthopedics, Dr. [name]. -PT/OT. -Per orthopedics WBAT with walker. -Pain management, DVT prophylaxis per orthopedics. -Needs SNF placement.  -Per orthopedics.  2.  Mild hypokalemia -Repleted.  3.  Hyperlipidemia -Statin.   4.  Iron deficiency anemia -H&H currently stable. -Labs pending. -May consider oral iron supplementation on discharge. -Follow.  5.  Dementia/depression -Stable.   -Continue Aricept, Namenda, Remeron, Zoloft.   -Seroquel nightly as needed agitation.   6.  Constipation -Significant stool burden seen on CT abdomen. -Status post fleets enema x 1. -Patient with response to enema. -Continue current bowel regimen of Senokot-S twice daily, MiraLAX twice daily.  7.  Recurrent falls -Patient with multiple falls over the past several days prior to admission  and recently evaluated by PT on 517 when she presented to the ED, SNF recommended however family refused. -Patient seen by PT OT who are recommending SNF placement.  -TOC consultation placed.    DVT prophylaxis: Postop DVT prophylaxis per orthopedics. Code Status: DNR Family Communication: Updated patient, no family at bedside.  Disposition: Likely SNF  Status is: Inpatient Remains inpatient appropriate because: Severity of illness.  Unsafe disposition   Consultants:  Orthopedics: Dr. [name] 04/18/2023 Palliative care: Dr. Patterson Hammersmith 04/18/2023  Procedures:  CT left hip 04/17/2023 CT head 04/17/2023 Chest x-ray 04/20/2023 Plain films of the pelvis 04/19/2023 Plain films of the left tib-fib 04/17/2023 Plain films of the left femur 04/17/2023 Plain films of bilateral hips and pelvis 04/17/2023. Left hip bipolar hemiarthroplasty for femoral neck fracture per orthopedics: Dr. [name] 04/19/2023  Antimicrobials:  Anti-infectives (From admission, onward)    Start     Dose/Rate Route Frequency Ordered Stop   04/19/23 0600  ceFAZolin (ANCEF) IVPB 2g/100 mL premix        2 g 200 mL/hr over 30 Minutes Intravenous On call to O.R. 04/18/23 1436 04/19/23 1749         Subjective: Patient laying in bed.  Pleasantly confused.   Objective: Vitals:   04/20/23 1724 04/20/23 1919 04/21/23 0532 04/21/23 0733  BP: 110/81 123/71 (!) 142/87 (!) 147/80  Pulse: 82 70  87  Resp: 18 19 18 16   Temp:   98.7 F (37.1 C) 97.7 F (36.5 C)  TempSrc:      SpO2:  100% 99% 92%  Weight:      Height:        Intake/Output Summary (Last 24 hours)  at 04/21/2023 1026 Last data filed at 04/21/2023 0207 Gross per 24 hour  Intake --  Output 700 ml  Net -700 ml    Filed Weights   04/17/23 1600  Weight: 59 kg    Examination:  General exam: NAD Respiratory system: CTAB anterior lung fields.  No wheezes, no crackles, no rhonchi.  Fair air movement.  Speaking in full sentences.   Cardiovascular system: RRR no  murmurs rubs or gallops.  No JVD.  No lower extremity edema.  Gastrointestinal system: Abdomen is soft, nontender, nondistended, positive bowel sounds.  No rebound.  No guarding.  Central nervous system: Alert. No focal neurological deficits. Extremities: Left lower extremity in knee immobilizer.  Skin: No rashes, lesions or ulcers Psychiatry: Judgement and insight appear poor. Mood & affect appropriate.     Data Reviewed: I have personally reviewed following labs and imaging studies  CBC: Recent Labs  Lab 04/17/23 1740 04/18/23 0526 04/19/23 0620 04/20/23 0655  WBC 7.7 6.3 4.8 6.4  NEUTROABS 5.7  --   --  5.4  HGB 12.6 10.9* 11.4* 13.1  HCT 38.8 33.3* 34.3* 39.5  MCV 94.9 95.7 93.2 92.3  PLT 166 136* 161 204     Basic Metabolic Panel: Recent Labs  Lab 04/17/23 1740 04/18/23 0526 04/19/23 0620 04/20/23 0655 04/21/23 0559  NA 139 137 137 137  --   K 3.5 3.1* 3.9 4.8  --   CL 103 109 105 104  --   CO2 28 22 24 22   --   GLUCOSE 100* 91 92 115*  --   BUN 18 11 11 12   --   CREATININE 0.59 0.44 0.56 0.54  --   CALCIUM 9.0 7.5* 8.8* 9.1  --   MG  --   --   --   --  2.0     GFR: Estimated Creatinine Clearance: 48.8 mL/min (by C-G formula based on SCr of 0.54 mg/dL).  Liver Function Tests: Recent Labs  Lab 04/17/23 1740  AST 18  ALT 15  ALKPHOS 50  BILITOT 1.7*  PROT 7.2  ALBUMIN 3.7     CBG: Recent Labs  Lab 04/19/23 0459  GLUCAP 91      Recent Results (from the past 240 hour(s))  Surgical pcr screen     Status: None   Collection Time: 04/19/23  4:51 AM   Specimen: Nasal Mucosa; Nasal Swab  Result Value Ref Range Status   MRSA, PCR NEGATIVE NEGATIVE Final   Staphylococcus aureus NEGATIVE NEGATIVE Final    Comment: (NOTE) The Xpert SA Assay (FDA approved for NASAL specimens in patients 27 years of age and older), is one component of a comprehensive surveillance program. It is not intended to diagnose infection nor to guide or monitor  treatment. Performed at [facility], [address] [place], [place] [address]          Radiology Studies: DG CHEST PORT 1 VIEW  Result Date: 04/20/2023 CLINICAL DATA:  Hypoxia EXAM: PORTABLE CHEST 1 VIEW COMPARISON:  Chest radiograph dated 04/14/2023 FINDINGS: Normal lung volumes. Left basilar linear atelectasis/scarring. No focal consolidations. No pleural effusion or pneumothorax. The heart size and mediastinal contours are within normal limits. No acute osseous abnormality. IMPRESSION: Left basilar linear atelectasis/scarring. No focal consolidations. Electronically Signed   By: Agustin Cree M.D.   On: 04/20/2023 08:51   Pelvis Portable  Result Date: 04/19/2023 CLINICAL DATA:  [id number] Post-operative state [id number] EXAM: PORTABLE PELVIS 1-2 VIEWS COMPARISON:  04/17/2023 FINDINGS: Postsurgical  changes of left hip arthroplasty. Normal alignment. No evidence of loosening or periprosthetic fracture. Expected soft tissue changes. IMPRESSION: Postsurgical changes of left hip arthroplasty. Normal alignment. No evidence of immediate hardware complication. Electronically Signed   By: Caprice Renshaw M.D.   On: 04/19/2023 18:53        Scheduled Meds:  aspirin EC  325 mg Oral Q breakfast   cholecalciferol  2,000 Units Oral Daily   docusate sodium  100 mg Oral BID   donepezil  5 mg Oral Daily   feeding supplement  237 mL Oral BID BM   melatonin  3 mg Oral QHS   memantine  10 mg Oral BID   mirtazapine  7.5 mg Oral QHS   multivitamin with minerals  1 tablet Oral Daily   polyethylene glycol  17 g Oral BID   rosuvastatin  5 mg Oral Daily   senna-docusate  2 tablet Oral QHS   sertraline  25 mg Oral Daily   sorbitol, milk of mag, mineral oil, glycerin (SMOG) enema  960 mL Rectal Once   Continuous Infusions:   LOS: 4 days    Time spent: 35 minutes    Ramiro Harvest, MD [facility]   To contact the attending provider between 7A-7P or the covering provider during after hours  7P-7A, please log into the web site [email or web address] and access using universal [place] password for that web site. If you do not have the password, please call the hospital operator.  04/21/2023, 10:26 AM

## 2023-04-21 NOTE — Progress Notes (Signed)
Mobility Specialist Progress Note   04/21/23 1118  Mobility  Activity Transferred from bed to chair  Level of Assistance Moderate assist, patient does 50-74%  Assistive Device Front wheel walker  Distance Ambulated (ft) 2 ft  Activity Response Tolerated well  Mobility Referral Yes  $Mobility charge 1 Mobility  Mobility Specialist Start Time (ACUTE ONLY) 1118  Mobility Specialist Stop Time (ACUTE ONLY) 1150  Mobility Specialist Time Calculation (min) (ACUTE ONLY) 32 min   Received pt in bed c/o mild pain LLE but agreeable. Pt not upholding posterior hip precautions and reeducated prior to session. Required minA and heavy cues to sequence pt to EOB. ModA to stand from an elevated surface and pivot to chair d/t posterior lean, max cues needed for hand and foot placement. Transferred to chair safely, left w/ call bell in reach and chair alarm on.   Alicia [name] Mobility Specialist Please contact via SecureChat or  Rehab office at [phone]

## 2023-04-21 NOTE — TOC Progression Note (Addendum)
Transition of Care [facility]) - Progression Note    Patient Details  Name: Alicia [name] MRN: [id number] Date of Birth: 10-11-1936  [facility]) CM/SW Contact  Durga Saldarriaga A [place], [place] Phone Number: 04/21/2023, 4:16 PM  Clinical Narrative:     CSW followed up with pt and he stated he is still deciding between [facility] and [facility] care. CSW informed him of potential dc tomorrow and decision regarding facility choice would need to be made. He said he understood.   Barriers to discharge. Continued medical work up. Insurance authorization.   TOC will continue to follow.    CSW contacted pt's spouse, [name], regarding SNF choice.   He said he was not interested in [facility].  Considering [facility]. He was given contact information for Olegario Messier at [facility] who is considering pt.   He stated he would reach out and discuss potential admission.  Barriers to discharge. SNF bed selection, continued medical work up, English as a second language teacher.  TOC will continue to follow.   Expected Discharge Plan: Skilled Nursing Facility Barriers to Discharge: Continued Medical Work up, SNF Pending bed offer, Insurance Authorization  Expected Discharge Plan and Services In-house Referral: Clinical Social Work     Living arrangements for the past 2 months:  (Memory Care)                                       Social Determinants of Health (SDOH) Interventions SDOH Screenings   Food Insecurity: Patient Unable To Answer (04/18/2023)  Transportation Needs: Patient Unable To Answer (04/18/2023)  Utilities: Patient Unable To Answer (04/18/2023)  Tobacco Use: Medium Risk (04/20/2023)    Readmission Risk Interventions     No data to display

## 2023-04-22 DIAGNOSIS — G309 Alzheimer's disease, unspecified: Secondary | ICD-10-CM | POA: Diagnosis not present

## 2023-04-22 DIAGNOSIS — S72002P Fracture of unspecified part of neck of left femur, subsequent encounter for closed fracture with malunion: Secondary | ICD-10-CM | POA: Diagnosis not present

## 2023-04-22 DIAGNOSIS — E876 Hypokalemia: Secondary | ICD-10-CM | POA: Diagnosis not present

## 2023-04-22 DIAGNOSIS — E785 Hyperlipidemia, unspecified: Secondary | ICD-10-CM | POA: Diagnosis not present

## 2023-04-22 LAB — CBC
HCT: 37.6 % (ref 36.0–46.0)
Hemoglobin: 12.8 g/dL (ref 12.0–15.0)
MCH: 31.6 pg (ref 26.0–34.0)
MCHC: 34 g/dL (ref 30.0–36.0)
MCV: 92.8 fL (ref 80.0–100.0)
Platelets: 201 10*3/uL (ref 150–400)
RBC: 4.05 MIL/uL (ref 3.87–5.11)
RDW: 14.4 % (ref 11.5–15.5)
WBC: 6.5 10*3/uL (ref 4.0–10.5)
nRBC: 0 % (ref 0.0–0.2)

## 2023-04-22 LAB — BASIC METABOLIC PANEL
Anion gap: 10 (ref 5–15)
BUN: 18 mg/dL (ref 8–23)
CO2: 24 mmol/L (ref 22–32)
Calcium: 9.2 mg/dL (ref 8.9–10.3)
Chloride: 102 mmol/L (ref 98–111)
Creatinine, Ser: 0.58 mg/dL (ref 0.44–1.00)
GFR, Estimated: >60 mL/min (ref >60)
Glucose, Bld: 87 mg/dL (ref 70–99)
Potassium: 4.1 mmol/L (ref 3.5–5.1)
Sodium: 136 mmol/L (ref 135–145)

## 2023-04-22 MED ORDER — ADULT MULTIVITAMIN W/MINERALS CH: 1.0000 | ORAL_TABLET | Freq: Every day | ORAL | Status: AC

## 2023-04-22 MED ORDER — POLYETHYLENE GLYCOL 3350 17 G PO PACK: 17.0000 g | PACK | Freq: Two times a day (BID) | ORAL | 0 refills | Status: AC

## 2023-04-22 MED ORDER — MELATONIN 3 MG PO TABS: 3.0000 mg | ORAL_TABLET | Freq: Every day | ORAL | 0 refills | Status: AC

## 2023-04-22 MED ORDER — BISACODYL 10 MG RE SUPP: 10.0000 mg | Freq: Every day | RECTAL | 0 refills | Status: AC | PRN

## 2023-04-22 MED ORDER — ENSURE ENLIVE PO LIQD: 237.0000 mL | Freq: Two times a day (BID) | ORAL | 12 refills | Status: AC

## 2023-04-22 MED ORDER — QUETIAPINE FUMARATE 25 MG PO TABS: 25.0000 mg | ORAL_TABLET | Freq: Every evening | ORAL | 0 refills | Status: AC | PRN

## 2023-04-22 MED ORDER — SENNOSIDES-DOCUSATE SODIUM 8.6-50 MG PO TABS: 2.0000 | ORAL_TABLET | Freq: Every day | ORAL | Status: AC

## 2023-04-22 MED ORDER — VITAMIN D3 50 MCG (2000 UT) PO TABS: 2000.0000 [IU] | ORAL_TABLET | Freq: Every day | ORAL | Status: AC

## 2023-04-22 NOTE — TOC Progression Note (Signed)
Transition of Care [facility]) - Progression Note    Patient Details  Name: Alicia [name] MRN: [id number] Date of Birth: February 11, 1936  Transition of Care [facility]) CM/SW Contact  Jimmy [place], [place] Phone Number: 04/22/2023, 5:19 PM  Clinical Narrative:     Pt has a DC order in, pt has not chosen a ben and does not have insurance auth. MD is aware.   CSW spoke to pts spouse [name] at bedside. CSW and Wade reviewed bed offers. After many questions [name] chose [facility]. CSW spoke with Kia at [facility], she will update CSW tomorrow on bed availability.   Expected Discharge Plan: Skilled Nursing Facility Barriers to Discharge: Continued Medical Work up, SNF Pending bed offer, Insurance Authorization  Expected Discharge Plan and Services In-house Referral: Clinical Social Work     Living arrangements for the past 2 months:  (Memory Care) Expected Discharge Date: 04/22/23                                     Social Determinants of Health (SDOH) Interventions SDOH Screenings   Food Insecurity: Patient Unable To Answer (04/18/2023)  Transportation Needs: Patient Unable To Answer (04/18/2023)  Utilities: Patient Unable To Answer (04/18/2023)  Tobacco Use: Medium Risk (04/20/2023)    Readmission Risk Interventions     No data to display

## 2023-04-22 NOTE — Progress Notes (Signed)
Patient ID: Alicia [name], female   DOB: 05/30/1936, 87 y.o.   MRN: [id number]   Subjective: 3 Days Post-Op Procedure(s) (LRB): ARTHROPLASTY BIPOLAR HIP (HEMIARTHROPLASTY) POSTERIOR (Left) Patient sleeping this morning. Appears comfortable. No complaints.   Objective: Vital signs in last 24 hours: Temp:  [97.9 F (36.6 C)-98.8 F (37.1 C)] 98 F (36.7 C) (05/25 0507) Pulse Rate:  [67-95] 95 (05/25 0507) Resp:  [15-18] 15 (05/25 0507) BP: (119-126)/(77-93) 126/79 (05/25 0507) SpO2:  [80 %-95 %] 95 % (05/25 0507)  Intake/Output from previous day: 05/24 0701 - 05/25 0700 In: -  Out: 300 [Urine:300] Intake/Output this shift: No intake/output data recorded.  Recent Labs    04/20/23 0655 04/22/23 0424  HGB 13.1 12.8    Recent Labs    04/20/23 0655 04/22/23 0424  WBC 6.4 6.5  RBC 4.28 4.05  HCT 39.5 37.6  PLT 204 201    Recent Labs    04/20/23 0655 04/22/23 0424  NA 137 136  K 4.8 4.1  CL 104 102  CO2 22 24  BUN 12 18  CREATININE 0.54 0.58  GLUCOSE 115* 87  CALCIUM 9.1 9.2     Slight blood on aquacell dressing  Assessment/Plan: 3 Days Post-Op Procedure(s) (LRB): ARTHROPLASTY BIPOLAR HIP (HEMIARTHROPLASTY) POSTERIOR (Left) Up with therapy, WBAT with walker Anticipate discharge to SNF when bed available  [name] 04/22/2023, 8:11 AM

## 2023-04-22 NOTE — Discharge Summary (Signed)
Physician Discharge Summary  Alicia [name] UJW:[id number] DOB: [birth year] DOA: 04/17/2023  PCP: [name], MD  Admit date: 04/17/2023 Discharge date: 04/22/2023  Time spent: 60 minutes  Recommendations for Outpatient Follow-up:  Follow-up with Dr. [name], orthopedics in 1 week. Follow-up with MD at skilled nursing facility.  Patient will need a basic metabolic profile as well as a CBC done to follow-up on electrolytes, renal function, counts in 1 week.  Patient's constipation need to be reassessed on follow-up.   Discharge Diagnoses:  Principal Problem:   Hip fracture (HCC) Active Problems:   [name]   Closed displaced fracture of left femoral neck (HCC)   Fracture of femoral neck, left (HCC)   Hyperlipidemia   Depression   Recurrent falls   Constipation   Hypokalemia   Discharge Condition: Stable and improved.  Diet recommendation: Regular  Filed Weights   04/17/23 1600  Weight: 59 kg    History of present illness:  HPI per Dr. Jonell [name] is a 87 y.o. female with medical history significant of advanced dementia who presents from her memory care unit at episode following a mechanical fall-upon further evaluation in the ED she was found to have left hip fracture.   Please note-patient is a poor historian, most of this history is obtained after reviewing ED note, and speaking with the husband at bedside.  Patient lives in [facility] memory care unit-her husband lives in independent living.  Patient apparently has advanced dementia but is able to ambulate around pretty independently.  Over the past several days she has had frequent falls, resulting in a ED visit on 5/17-she was evaluated and underwent numerous x-rays that were negative for fracture.  She was evaluated by PT and SNF was recommended, however family refused and she went back to memory care unit.  Per husband-this afternoon she was walking to the bathroom, when she  looked behind-lost her balance and fell.  She presented to the ED-and upon further evaluation with numerous imaging studies she was found to have a left hip fracture.  Per husband who witnessed the fall, there was no loss of consciousness.   ED-MD spoke with orthopedics-Dr. Kristopher Oppenheim requested transfer to [facility] so that patient could be evaluated by her primary orthopedic surgeon Dr. [name].   As noted above-no syncopal episode.  Husband did not notice any obvious chest pain or shortness of breath.  Patient has been in her usual state of health-without any other ongoing issues.  At baseline-very pleasantly confused but able to ambulate independently.  Hospital Course:  #1 left femoral neck fracture -Secondary to mechanical fall. -Patient seen in consultation by orthopedics and patient underwent a left hip bipolar hemiarthroplasty for femoral neck fracture per orthopedics, Dr. [name]. -PT/OT. -Per orthopedics WBAT with walker. -Pain management, DVT prophylaxis per orthopedics. -Patient with discharge on aspirin for DVT prophylaxis. -Patient was discharged to skilled nursing facility. -Outpatient follow-up with Dr. [name], orthopedics in 1 week.   2.  Mild hypokalemia -Repleted.   3.  Hyperlipidemia -Patient maintained on statin.    4.  Iron deficiency anemia -H&H remained stable with hemoglobin at 12.8 by day of discharge.   -Outpatient follow-up.    5.  Dementia/depression -Stable.   -Patient maintained on home regimen Aricept, Namenda, Remeron, Zoloft.   -Seroquel nightly as needed agitation.  -Outpatient follow-up.   6.  Constipation -Significant stool burden seen on CT abdomen. -Status post fleets enema x 1. -Patient with response to enema. -Patient started  on a bowel prep Senokot-S twice and MiraLAX.Marland Kitchen  As well as Dulcolax suppository as needed.   -Outpatient follow-up.    7.  Recurrent falls -Patient with multiple falls over the past several days prior to admission and  recently evaluated by PT on 5/17 when she presented to the ED, SNF recommended however family refused. -Patient seen by PT/ OT who recommended SNF placement.  -TOC consultation placed. -Patient will be discharged to SNF.      Procedures: CT left hip 04/17/2023 CT head 04/17/2023 Chest x-ray 04/20/2023 Plain films of the pelvis 04/19/2023 Plain films of the left tib-fib 04/17/2023 Plain films of the left femur 04/17/2023 Plain films of bilateral hips and pelvis 04/17/2023. Left hip bipolar hemiarthroplasty for femoral neck fracture per orthopedics: Dr. [name] 04/19/2023  Consultations: Orthopedics: Dr. [name] 04/18/2023 Palliative care: Dr. Patterson Hammersmith 04/18/2023  Discharge Exam: Vitals:   04/22/23 0507 04/22/23 0811  BP: 126/79 (!) 142/82  Pulse: 95 64  Resp: 15 17  Temp: 98 F (36.7 C) 98.3 F (36.8 C)  SpO2: 95% 93%    General: NAD Cardiovascular: RRR no murmurs rubs or gallops.  No JVD.  No lower extremity edema. Respiratory: Clear to auscultation bilaterally anterior lung fields.  No wheezes, no crackles, no rhonchi.  Fair air movement.  Speaking in full sentences.  Discharge Instructions   Discharge Instructions     Diet general   Complete by: As directed    Increase activity slowly   Complete by: As directed    No wound care   Complete by: As directed       Allergies as of 04/22/2023       Reactions   Contrast Media [iodinated Contrast Media] Other (See Comments)   Husband cannot remember reaction         Medication List     TAKE these medications    acetaminophen 325 MG tablet Commonly known as: TYLENOL Take 650 mg by mouth every 6 (six) hours as needed (for pain).   aspirin EC 325 MG tablet Take 1 tablet (325 mg total) by mouth daily with breakfast. Take times 4 wks then stop   bisacodyl 10 MG suppository Commonly known as: DULCOLAX Place 1 suppository (10 mg total) rectally daily as needed for moderate constipation.   busPIRone 5 MG tablet Commonly  known as: BUSPAR Take 5 mg by mouth 2 (two) times daily as needed (for anxiety).   donepezil 5 MG tablet Commonly known as: ARICEPT TAKE ONE TABLET BY MOUTH AT bedtime What changed:  when to take this Another medication with the same name was removed. Continue taking this medication, and follow the directions you see here.   feeding supplement Liqd Take 237 mLs by mouth 2 (two) times daily between meals.   HYDROcodone-acetaminophen 5-325 MG tablet Commonly known as: NORCO/VICODIN Take 1 tablet by mouth every 6 (six) hours as needed for moderate pain.   melatonin 3 MG Tabs tablet Take 1 tablet (3 mg total) by mouth at bedtime.   memantine 10 MG tablet Commonly known as: NAMENDA Take 1 tablet (10 mg total) by mouth 2 (two) times daily. What changed: when to take this   mirtazapine 7.5 MG tablet Commonly known as: REMERON Take 7.5 mg by mouth at bedtime.   multivitamin with minerals Tabs tablet Take 1 tablet by mouth daily. Start taking on: Apr 23, 2023   polyethylene glycol 17 g packet Commonly known as: MIRALAX / GLYCOLAX Take 17 g by mouth 2 (two) times daily.  What changed:  when to take this reasons to take this   QUEtiapine 25 MG tablet Commonly known as: SEROQUEL Take 1 tablet (25 mg total) by mouth at bedtime as needed (agitation).   rosuvastatin 5 MG tablet Commonly known as: CRESTOR Take 5 mg by mouth daily.   senna-docusate 8.6-50 MG tablet Commonly known as: Senokot-S Take 2 tablets by mouth at bedtime.   sertraline 25 MG tablet Commonly known as: ZOLOFT Take 25 mg by mouth daily.   Vitamin D3 50 MCG (2000 UT) Tabs Take 1 tablet (2,000 Units total) by mouth daily.       Allergies  Allergen Reactions   Contrast Media [Iodinated Contrast Media] Other (See Comments)    Husband cannot remember reaction      Follow-up Information     Eldred Manges, MD Follow up in 1 week(s).   Specialty: Orthopedic Surgery Contact information: [address] [place] [address] [phone]         MD at SNF Follow up.                   The results of significant diagnostics from this hospitalization (including imaging, microbiology, ancillary and laboratory) are listed below for reference.    Significant Diagnostic Studies: DG CHEST PORT 1 VIEW  Result Date: 04/20/2023 CLINICAL DATA:  Hypoxia EXAM: PORTABLE CHEST 1 VIEW COMPARISON:  Chest radiograph dated 04/14/2023 FINDINGS: Normal lung volumes. Left basilar linear atelectasis/scarring. No focal consolidations. No pleural effusion or pneumothorax. The heart size and mediastinal contours are within normal limits. No acute osseous abnormality. IMPRESSION: Left basilar linear atelectasis/scarring. No focal consolidations. Electronically Signed   By: Agustin Cree M.D.   On: 04/20/2023 08:51   Pelvis Portable  Result Date: 04/19/2023 CLINICAL DATA:  [id number] Post-operative state [id number] EXAM: PORTABLE PELVIS 1-2 VIEWS COMPARISON:  04/17/2023 FINDINGS: Postsurgical changes of left hip arthroplasty. Normal alignment. No evidence of loosening or periprosthetic fracture. Expected soft tissue changes. IMPRESSION: Postsurgical changes of left hip arthroplasty. Normal alignment. No evidence of immediate hardware complication. Electronically Signed   By: Caprice Renshaw M.D.   On: 04/19/2023 18:53   CT Hip Left Wo Contrast  Result Date: 04/17/2023 CLINICAL DATA:  Possible left hip fracture.  Hip trauma EXAM: CT OF THE LEFT HIP WITHOUT CONTRAST TECHNIQUE: Multidetector CT imaging of the left hip was performed according to the standard protocol. Multiplanar CT image reconstructions were also generated. RADIATION DOSE REDUCTION: This exam was performed according to the departmental dose-optimization program which includes automated exposure control, adjustment of the mA and/or kV according to patient size and/or use of iterative reconstruction technique. COMPARISON:  Radiographs 04/17/2023 FINDINGS:  Bones/Joint/Cartilage There is cortical disruption of the anterior cortex of the femoral neck near the head neck junction (series 3/image 81 and 8/64). Irregular sclerosis at the head neck junction. Findings are suspicious for nondisplaced fracture. Degenerative changes about the left hip, SI joint, and visualized lumbar spine. Ligaments Suboptimally assessed by CT. Muscles and Tendons Unremarkable CT appearance. Soft tissues Large stool ball in the rectum with rectal mild wall thickening. Mild subcutaneous edema in the left gluteal soft tissues. IMPRESSION: 1. Findings suspicious for nondisplaced subcapital left femoral neck fracture. Consider MRI for confirmation. 2. Large stool ball in the rectum with mild rectal wall thickening suggesting impaction and stercoral colitis. Electronically Signed   By: Minerva Fester M.D.   On: 04/17/2023 18:58   DG Tibia/Fibula Left  Result Date: 04/17/2023 CLINICAL DATA:  Fall yesterday. EXAM:  LEFT TIBIA AND FIBULA - 2 VIEW COMPARISON:  None Available. FINDINGS: There is no evidence of fracture or other focal bone lesions. Soft tissues are unremarkable. IMPRESSION: Negative. Electronically Signed   By: Lupita Raider M.D.   On: 04/17/2023 18:54   DG Femur 1V Left  Result Date: 04/17/2023 CLINICAL DATA:  Fall yesterday. EXAM: LEFT FEMUR 1 VIEW COMPARISON:  None Available. FINDINGS: Minimally displaced subcapital fracture of proximal left femur cannot be excluded. Correlation with CT findings of same day is recommended. No abnormality seen involving middle and distal portions of the left femur. IMPRESSION: Findings concerning for possible minimally displaced subcapital fracture of proximal left femur. Correlation with CT findings is recommended. Electronically Signed   By: Lupita Raider M.D.   On: 04/17/2023 18:52   DG Knee Complete 4 Views Left  Result Date: 04/17/2023 CLINICAL DATA:  Larey Seat EXAM: LEFT KNEE - COMPLETE 4+ VIEW COMPARISON:  None Available. FINDINGS:  Frontal, bilateral oblique, and cross-table lateral views of the left knee are obtained. Diffuse osteopenia. There are no acute displaced fractures. Mild 3 compartmental osteoarthritis. Trace joint effusion. Soft tissues are unremarkable. IMPRESSION: 1. No acute fracture. 2. Mild 3 compartmental osteoarthritis and likely reactive trace suprapatellar joint effusion. 3. Diffuse osteopenia. Electronically Signed   By: Sharlet Salina M.D.   On: 04/17/2023 17:45   DG Hips Bilat W or Wo Pelvis 3-4 Views  Result Date: 04/17/2023 CLINICAL DATA:  Larey Seat yesterday, confusion EXAM: DG HIP (WITH OR WITHOUT PELVIS) 3-4V BILAT COMPARISON:  03/26/2020 FINDINGS: Frontal view of the pelvis, frontal and frogleg lateral views of the right hip, and frontal and cross-table lateral views of the left hip are obtained on 5 images. There is severe bilateral hip osteoarthritis. Subtle sclerosis seen in the subcapital region of the left femoral neck likely reflects a collar of osteophytes rather than nondisplaced fracture. Underlying trabecula appear intact. If nondisplaced fracture is a concern, CT could be considered. No other signs of acute displaced fracture. Remainder of the bony pelvis is unremarkable. Large amount of stool within the rectal vault consistent with fecal impaction. IMPRESSION: 1. Severe bilateral hip osteoarthritis. 2. Left femoral neck subcapital sclerosis favors abundant osteophytes over nondisplaced fracture. The underlying trabecula appear intact. If there is concern for nondisplaced left hip fracture a CT could be performed. 3. Otherwise no acute bony abnormalities. 4. Large amount of retained stool in the rectal vault concerning for fecal impaction. Electronically Signed   By: Sharlet Salina M.D.   On: 04/17/2023 17:45   CT Head Wo Contrast  Result Date: 04/17/2023 CLINICAL DATA:  Dementia, fell yesterday EXAM: CT HEAD WITHOUT CONTRAST TECHNIQUE: Contiguous axial images were obtained from the base of the skull  through the vertex without intravenous contrast. RADIATION DOSE REDUCTION: This exam was performed according to the departmental dose-optimization program which includes automated exposure control, adjustment of the mA and/or kV according to patient size and/or use of iterative reconstruction technique. COMPARISON:  04/14/2023 FINDINGS: Brain: Stable chronic small-vessel ischemic changes throughout the periventricular white matter. No evidence of acute infarct or hemorrhage. Lateral ventricles and midline structures are stable. No acute extra-axial fluid collections. No mass effect. Vascular: Stable atherosclerosis.  No hyperdense vessel. Skull: Normal. Negative for fracture or focal lesion. Sinuses/Orbits: No acute finding. Other: None. IMPRESSION: 1. Stable head CT, no acute intracranial process. Electronically Signed   By: Sharlet Salina M.D.   On: 04/17/2023 17:15   CT Head Wo Contrast  Result Date: 04/14/2023 CLINICAL DATA:  Provided history: Head trauma, minor. Neck trauma. EXAM: CT HEAD WITHOUT CONTRAST CT CERVICAL SPINE WITHOUT CONTRAST TECHNIQUE: Multidetector CT imaging of the head and cervical spine was performed following the standard protocol without intravenous contrast. Multiplanar CT image reconstructions of the cervical spine were also generated. RADIATION DOSE REDUCTION: This exam was performed according to the departmental dose-optimization program which includes automated exposure control, adjustment of the mA and/or kV according to patient size and/or use of iterative reconstruction technique. COMPARISON:  Head CT 08/07/2022. FINDINGS: CT HEAD FINDINGS Brain: Moderate-to-advanced generalized cerebral atrophy. Patchy and ill-defined hypoattenuation within the cerebral white matter, nonspecific but compatible with moderate-to-advanced chronic small vessel ischemic disease. Prominent perivascular space within the inferior left basal ganglia. There is no acute intracranial hemorrhage. No  demarcated cortical infarct. No extra-axial fluid collection. No evidence of an intracranial mass. No midline shift. Vascular: No hyperdense vessel. Atherosclerotic calcifications. Skull: No fracture or aggressive osseous lesion. Sinuses/Orbits: No mass or acute finding within the imaged orbits. Mild mucosal thickening within the bilateral frontal sinuses. CT CERVICAL SPINE FINDINGS Alignment: 2 mm grade 1 retrolisthesis at C3-C4 and C4-C5. Skull base and vertebrae: The basion-dental and atlanto-dental intervals are maintained.No evidence of acute fracture to the cervical spine. Soft tissues and spinal canal: No prevertebral fluid or swelling. No visible canal hematoma. Disc levels: Cervical spondylosis with multilevel disc space narrowing, disc bulges/central disc protrusions, posterior disc osteophyte complexes, endplate spurring, uncovertebral hypertrophy and facet arthrosis. Disc space narrowing is greatest at C4-C5 (advanced at this level). Multilevel spinal canal stenosis most notably as follows. At C4-C5, a disc bulge contributes to at least moderate spinal canal stenosis. At C5-C6, a posterior disc osteophyte complex contributes to at least moderate spinal canal stenosis. Multilevel bony neural foraminal narrowing. Degenerative changes are also present at the C1-C2 articulation. Upper chest: No consolidation within the imaged lung apices. No visible pneumothorax. IMPRESSION: CT head: 1.  No evidence of an acute intracranial abnormality. 2. Moderate-to-advanced chronic small vessel ischemic changes within the cerebral white matter. 3. Moderate-to-advanced generalized cerebral atrophy. 4. Mild mucosal thickening within the bilateral frontal sinuses. CT cervical spine: 1. No evidence of acute fracture to the cervical spine. 2. 2 mm grade 1 retrolisthesis at C3-C4 and C4-C5. 3. Cervical spondylosis as described. At least moderate spinal canal stenosis at C4-C5 and C5-C6. Electronically Signed   By: Jackey Loge  D.O.   On: 04/14/2023 11:13   CT Cervical Spine Wo Contrast  Result Date: 04/14/2023 CLINICAL DATA:  Provided history: Head trauma, minor. Neck trauma. EXAM: CT HEAD WITHOUT CONTRAST CT CERVICAL SPINE WITHOUT CONTRAST TECHNIQUE: Multidetector CT imaging of the head and cervical spine was performed following the standard protocol without intravenous contrast. Multiplanar CT image reconstructions of the cervical spine were also generated. RADIATION DOSE REDUCTION: This exam was performed according to the departmental dose-optimization program which includes automated exposure control, adjustment of the mA and/or kV according to patient size and/or use of iterative reconstruction technique. COMPARISON:  Head CT 08/07/2022. FINDINGS: CT HEAD FINDINGS Brain: Moderate-to-advanced generalized cerebral atrophy. Patchy and ill-defined hypoattenuation within the cerebral white matter, nonspecific but compatible with moderate-to-advanced chronic small vessel ischemic disease. Prominent perivascular space within the inferior left basal ganglia. There is no acute intracranial hemorrhage. No demarcated cortical infarct. No extra-axial fluid collection. No evidence of an intracranial mass. No midline shift. Vascular: No hyperdense vessel. Atherosclerotic calcifications. Skull: No fracture or aggressive osseous lesion. Sinuses/Orbits: No mass or acute finding within the imaged orbits. Mild mucosal thickening within  the bilateral frontal sinuses. CT CERVICAL SPINE FINDINGS Alignment: 2 mm grade 1 retrolisthesis at C3-C4 and C4-C5. Skull base and vertebrae: The basion-dental and atlanto-dental intervals are maintained.No evidence of acute fracture to the cervical spine. Soft tissues and spinal canal: No prevertebral fluid or swelling. No visible canal hematoma. Disc levels: Cervical spondylosis with multilevel disc space narrowing, disc bulges/central disc protrusions, posterior disc osteophyte complexes, endplate spurring,  uncovertebral hypertrophy and facet arthrosis. Disc space narrowing is greatest at C4-C5 (advanced at this level). Multilevel spinal canal stenosis most notably as follows. At C4-C5, a disc bulge contributes to at least moderate spinal canal stenosis. At C5-C6, a posterior disc osteophyte complex contributes to at least moderate spinal canal stenosis. Multilevel bony neural foraminal narrowing. Degenerative changes are also present at the C1-C2 articulation. Upper chest: No consolidation within the imaged lung apices. No visible pneumothorax. IMPRESSION: CT head: 1.  No evidence of an acute intracranial abnormality. 2. Moderate-to-advanced chronic small vessel ischemic changes within the cerebral white matter. 3. Moderate-to-advanced generalized cerebral atrophy. 4. Mild mucosal thickening within the bilateral frontal sinuses. CT cervical spine: 1. No evidence of acute fracture to the cervical spine. 2. 2 mm grade 1 retrolisthesis at C3-C4 and C4-C5. 3. Cervical spondylosis as described. At least moderate spinal canal stenosis at C4-C5 and C5-C6. Electronically Signed   By: Jackey Loge D.O.   On: 04/14/2023 11:13   DG Pelvis 1-2 Views  Result Date: 04/14/2023 CLINICAL DATA:  Fall. EXAM: PELVIS - 1-2 VIEW COMPARISON:  Pelvis x-ray dated July 20, 2022. FINDINGS: There is no evidence of pelvic fracture or diastasis. 2.9 cm mixed sclerotic and lucent lesion in the left subtrochanteric proximal femur is unchanged since 2020, benign. Unchanged mild bilateral hip osteoarthritis. IMPRESSION: 1. No acute osseous abnormality. Electronically Signed   By: Obie Dredge M.D.   On: 04/14/2023 09:53   DG Chest 1 View  Result Date: 04/14/2023 CLINICAL DATA:  Fall EXAM: CHEST  1 VIEW COMPARISON:  CXR 08/07/22 FINDINGS: Heart size is at the upper limits of normal. Normal mediastinal contours. No pleural effusion. No pneumothorax. No focal airspace opacity. The visualized skeletal structures are unremarkable. IMPRESSION:  No radiographic evidence of thoracic injury. Electronically Signed   By: Lorenza Cambridge M.D.   On: 04/14/2023 09:43    Microbiology: Recent Results (from the past 240 hour(s))  Surgical pcr screen     Status: None   Collection Time: 04/19/23  4:51 AM   Specimen: Nasal Mucosa; Nasal Swab  Result Value Ref Range Status   MRSA, PCR NEGATIVE NEGATIVE Final   Staphylococcus aureus NEGATIVE NEGATIVE Final    Comment: (NOTE) The Xpert SA Assay (FDA approved for NASAL specimens in patients 27 years of age and older), is one component of a comprehensive surveillance program. It is not intended to diagnose infection nor to guide or monitor treatment. Performed at [facility], [address], [place], [place] [address]      Labs: Basic Metabolic Panel: Recent Labs  Lab 04/17/23 1740 04/18/23 0526 04/19/23 0620 04/20/23 0655 04/21/23 0559 04/22/23 0424  NA 139 137 137 137  --  136  K 3.5 3.1* 3.9 4.8  --  4.1  CL 103 109 105 104  --  102  CO2 28 22 24 22   --  24  GLUCOSE 100* 91 92 115*  --  87  BUN 18 11 11 12   --  18  CREATININE 0.59 0.44 0.56 0.54  --  0.58  CALCIUM 9.0 7.5* 8.8* 9.1  --  9.2  MG  --   --   --   --  2.0  --    Liver Function Tests: Recent Labs  Lab 04/17/23 1740  AST 18  ALT 15  ALKPHOS 50  BILITOT 1.7*  PROT 7.2  ALBUMIN 3.7   No results for input(s): "LIPASE", "AMYLASE" in the last 168 hours. No results for input(s): "AMMONIA" in the last 168 hours. CBC: Recent Labs  Lab 04/17/23 1740 04/18/23 0526 04/19/23 0620 04/20/23 0655 04/22/23 0424  WBC 7.7 6.3 4.8 6.4 6.5  NEUTROABS 5.7  --   --  5.4  --   HGB 12.6 10.9* 11.4* 13.1 12.8  HCT 38.8 33.3* 34.3* 39.5 37.6  MCV 94.9 95.7 93.2 92.3 92.8  PLT 166 136* 161 204 201   Cardiac Enzymes: No results for input(s): "CKTOTAL", "CKMB", "CKMBINDEX", "TROPONINI" in the last 168 hours. BNP: BNP (last 3 results) Recent Labs    04/20/23 0655  BNP 56.2    ProBNP (last 3 results) No  results for input(s): "PROBNP" in the last 8760 hours.  CBG: Recent Labs  Lab 04/19/23 0459  GLUCAP 91       Signed:  Ramiro Harvest MD.  [facility] 04/22/2023, 9:57 AM

## 2023-04-23 DIAGNOSIS — S72002P Fracture of unspecified part of neck of left femur, subsequent encounter for closed fracture with malunion: Secondary | ICD-10-CM | POA: Diagnosis not present

## 2023-04-23 DIAGNOSIS — F32A Depression, unspecified: Secondary | ICD-10-CM | POA: Diagnosis not present

## 2023-04-23 DIAGNOSIS — E785 Hyperlipidemia, unspecified: Secondary | ICD-10-CM | POA: Diagnosis not present

## 2023-04-23 DIAGNOSIS — G309 Alzheimer's disease, unspecified: Secondary | ICD-10-CM | POA: Diagnosis not present

## 2023-04-23 LAB — CBC
HCT: 37 % (ref 36.0–46.0)
Hemoglobin: 12.5 g/dL (ref 12.0–15.0)
MCH: 30.9 pg (ref 26.0–34.0)
MCHC: 33.8 g/dL (ref 30.0–36.0)
MCV: 91.4 fL (ref 80.0–100.0)
Platelets: 243 10*3/uL (ref 150–400)
RBC: 4.05 MIL/uL (ref 3.87–5.11)
RDW: 14.3 % (ref 11.5–15.5)
WBC: 6.3 10*3/uL (ref 4.0–10.5)
nRBC: 0 % (ref 0.0–0.2)

## 2023-04-23 LAB — GLUCOSE, CAPILLARY
Glucose-Capillary: 104 mg/dL — ABNORMAL HIGH (ref 70–99)
Glucose-Capillary: 90 mg/dL (ref 70–99)
Glucose-Capillary: 122 mg/dL — ABNORMAL HIGH (ref 70–99)

## 2023-04-23 NOTE — Progress Notes (Signed)
Patient ID: Alicia [name], female   DOB: 03-24-1936, 87 y.o.   MRN: [id number]   Subjective: 4 Days Post-Op Procedure(s) (LRB): ARTHROPLASTY BIPOLAR HIP (HEMIARTHROPLASTY) POSTERIOR (Left) Patient asleep this morning. Pain controlled. No complaints. Wants to go back to sleep.  Objective: Vital signs in last 24 hours: Temp:  [97.5 F (36.4 C)-98.3 F (36.8 C)] 97.6 F (36.4 C) (05/26 0445) Pulse Rate:  [64-76] 66 (05/26 0445) Resp:  [16-18] 18 (05/26 0445) BP: (103-142)/(63-98) 133/84 (05/26 0445) SpO2:  [93 %-98 %] 98 % (05/26 0445)  Intake/Output from previous day: 05/25 0701 - 05/26 0700 In: 120 [P.O.:120] Out: -  Intake/Output this shift:  Recent Labs    04/22/23 0424 04/23/23 0610  HGB 12.8 12.5    Recent Labs    04/22/23 0424 04/23/23 0610  WBC 6.5 6.3  RBC 4.05 4.05  HCT 37.6 37.0  PLT 201 243    Recent Labs    04/22/23 0424  NA 136  K 4.1  CL 102  CO2 24  BUN 18  CREATININE 0.58  GLUCOSE 87  CALCIUM 9.2     Slight blood on aquacell dressing  Assessment/Plan: 4 Days Post-Op Procedure(s) (LRB): ARTHROPLASTY BIPOLAR HIP (HEMIARTHROPLASTY) POSTERIOR (Left) Up with therapy, WBAT with walker Anticipate discharge to SNF when bed available  [name] 04/23/2023, 7:10 AM

## 2023-04-23 NOTE — Progress Notes (Signed)
PROGRESS NOTE    Alicia [name]  ZOX:[id number] DOB: Aug 19, 1936 DOA: 04/17/2023 PCP: [name], MD    Chief Complaint  Patient presents with   Leg Injury   Fall    Brief Narrative:  Patient is a 87 year old female history of advanced dementia presented from memory care unit following mechanical fall.  Workup in the ED concerning for left hip fracture.  Orthopedics consulted, patient transferred to [facility] to be assessed by her primary orthopedic surgeon, Dr. [name].  Patient subsequently underwent left bipolar hip hemiarthroplasty 04/19/2023.  Patient also noted to be constipated and placed on a bowel regimen.   Assessment & Plan:   Principal Problem:   Hip fracture (HCC) Active Problems:   [name]   Closed displaced fracture of left femoral neck (HCC)   Fracture of femoral neck, left (HCC)   Hyperlipidemia   Depression   Recurrent falls   Constipation   Hypokalemia  #1 left femoral neck fracture -Secondary to mechanical fall. -Patient seen in consultation by orthopedics and patient underwent a left hip bipolar hemiarthroplasty for femoral neck fracture per orthopedics, Dr. [name]. -PT/OT. -Per orthopedics WBAT with walker. -Pain management, DVT prophylaxis per orthopedics. -Awaiting SNF placement.  -Per orthopedics.  2.  Mild hypokalemia -Repleted.  3.  Hyperlipidemia -Continue statin.    4.  Iron deficiency anemia -H&H currently stable. -May consider oral iron supplementation on discharge. -Follow.  5.  Dementia/depression -Stable.   -Continue Aricept, Namenda, Remeron, Zoloft.   -Seroquel nightly as needed agitation.   6.  Constipation -Significant stool burden seen on CT abdomen. -Status post fleets enema x 1. -Patient with response to enema. -Continue current bowel regimen of Senokot as twice daily, MiraLAX twice daily.    7.  Recurrent falls -Patient with multiple falls over the past several days prior to admission  and recently evaluated by PT on 517 when she presented to the ED, SNF recommended however family refused. -Patient seen by PT OT who are recommending SNF placement.  -Family in agreement with SNF placement. -TOC consultation placed.    DVT prophylaxis: Postop DVT prophylaxis per orthopedics. Code Status: DNR Family Communication: Updated patient, no family at bedside.  Disposition: Medically stable since 04/22/2023.  Awaiting placement at SNF.  Likely SNF  Status is: Inpatient Remains inpatient appropriate because: Severity of illness.  Unsafe disposition   Consultants:  Orthopedics: Dr. [name] 04/18/2023 Palliative care: Dr. Patterson Hammersmith 04/18/2023  Procedures:  CT left hip 04/17/2023 CT head 04/17/2023 Chest x-ray 04/20/2023 Plain films of the pelvis 04/19/2023 Plain films of the left tib-fib 04/17/2023 Plain films of the left femur 04/17/2023 Plain films of bilateral hips and pelvis 04/17/2023. Left hip bipolar hemiarthroplasty for femoral neck fracture per orthopedics: Dr. [name] 04/19/2023  Antimicrobials:  Anti-infectives (From admission, onward)    Start     Dose/Rate Route Frequency Ordered Stop   04/19/23 0600  ceFAZolin (ANCEF) IVPB 2g/100 mL premix        2 g 200 mL/hr over 30 Minutes Intravenous On call to O.R. 04/18/23 1436 04/19/23 1749         Subjective: Sleeping.  Easily arousable.  No chest pain.  No shortness of breath.  No abdominal pain.    Objective: Vitals:   04/22/23 1640 04/22/23 1952 04/23/23 0445 04/23/23 0903  BP: 103/63 (!) 141/98 133/84 127/77  Pulse: 76 66 66 (!) 59  Resp: 18 16 18 18   Temp: (!) 97.5 F (36.4 C) 97.8 F (36.6 C) 97.6 F (36.4  C) 98 F (36.7 C)  TempSrc: Oral  Oral   SpO2: 97% 96% 98% 93%  Weight:      Height:        Intake/Output Summary (Last 24 hours) at 04/23/2023 1012 Last data filed at 04/22/2023 2100 Gross per 24 hour  Intake 120 ml  Output --  Net 120 ml    Filed Weights   04/17/23 1600  Weight: 59 kg     Examination:  General exam: NAD. Respiratory system: Lungs clear to auscultation bilaterally anterior lung fields.  No wheeze, no crackles, no rhonchi.  Fair air movement.  Cardiovascular system: Regular rate and rhythm no murmurs rubs or gallops.  No JVD.  No lower extremity edema.  Gastrointestinal system: Abdomen is soft, nontender, nondistended, positive bowel sounds.   Central nervous system: Alert. No focal neurological deficits. Extremities: Left lower extremity in knee immobilizer.  Skin: No rashes, lesions or ulcers Psychiatry: Judgement and insight appear poor. Mood & affect appropriate.     Data Reviewed: I have personally reviewed following labs and imaging studies  CBC: Recent Labs  Lab 04/17/23 1740 04/18/23 0526 04/19/23 0620 04/20/23 0655 04/22/23 0424 04/23/23 0610  WBC 7.7 6.3 4.8 6.4 6.5 6.3  NEUTROABS 5.7  --   --  5.4  --   --   HGB 12.6 10.9* 11.4* 13.1 12.8 12.5  HCT 38.8 33.3* 34.3* 39.5 37.6 37.0  MCV 94.9 95.7 93.2 92.3 92.8 91.4  PLT 166 136* 161 204 201 243     Basic Metabolic Panel: Recent Labs  Lab 04/17/23 1740 04/18/23 0526 04/19/23 0620 04/20/23 0655 04/21/23 0559 04/22/23 0424  NA 139 137 137 137  --  136  K 3.5 3.1* 3.9 4.8  --  4.1  CL 103 109 105 104  --  102  CO2 28 22 24 22   --  24  GLUCOSE 100* 91 92 115*  --  87  BUN 18 11 11 12   --  18  CREATININE 0.59 0.44 0.56 0.54  --  0.58  CALCIUM 9.0 7.5* 8.8* 9.1  --  9.2  MG  --   --   --   --  2.0  --      GFR: Estimated Creatinine Clearance: 48.8 mL/min (by C-G formula based on SCr of 0.58 mg/dL).  Liver Function Tests: Recent Labs  Lab 04/17/23 1740  AST 18  ALT 15  ALKPHOS 50  BILITOT 1.7*  PROT 7.2  ALBUMIN 3.7     CBG: Recent Labs  Lab 04/19/23 0459 04/23/23 0811  GLUCAP 91 104*      Recent Results (from the past 240 hour(s))  Surgical pcr screen     Status: None   Collection Time: 04/19/23  4:51 AM   Specimen: Nasal Mucosa; Nasal Swab   Result Value Ref Range Status   MRSA, PCR NEGATIVE NEGATIVE Final   Staphylococcus aureus NEGATIVE NEGATIVE Final    Comment: (NOTE) The Xpert SA Assay (FDA approved for NASAL specimens in patients 77 years of age and older), is one component of a comprehensive surveillance program. It is not intended to diagnose infection nor to guide or monitor treatment. Performed at [facility], [address] [place], [place] [address]          Radiology Studies: No results found.      Scheduled Meds:  aspirin EC  325 mg Oral Q breakfast   cholecalciferol  2,000 Units Oral Daily   docusate sodium  100 mg Oral BID   donepezil  5 mg Oral Daily   feeding supplement  237 mL Oral BID BM   melatonin  3 mg Oral QHS   memantine  10 mg Oral BID   mirtazapine  7.5 mg Oral QHS   multivitamin with minerals  1 tablet Oral Daily   polyethylene glycol  17 g Oral BID   rosuvastatin  5 mg Oral Daily   senna-docusate  2 tablet Oral QHS   sertraline  25 mg Oral Daily   sorbitol, milk of mag, mineral oil, glycerin (SMOG) enema  960 mL Rectal Once   Continuous Infusions:   LOS: 6 days    Time spent: 35 minutes    Ramiro Harvest, MD [facility]   To contact the attending provider between 7A-7P or the covering provider during after hours 7P-7A, please log into the web site [email or web address] and access using universal [place] password for that web site. If you do not have the password, please call the hospital operator.  04/23/2023, 10:12 AM

## 2023-04-24 DIAGNOSIS — S72002P Fracture of unspecified part of neck of left femur, subsequent encounter for closed fracture with malunion: Secondary | ICD-10-CM | POA: Diagnosis not present

## 2023-04-24 DIAGNOSIS — E785 Hyperlipidemia, unspecified: Secondary | ICD-10-CM | POA: Diagnosis not present

## 2023-04-24 DIAGNOSIS — G309 Alzheimer's disease, unspecified: Secondary | ICD-10-CM | POA: Diagnosis not present

## 2023-04-24 DIAGNOSIS — F32A Depression, unspecified: Secondary | ICD-10-CM | POA: Diagnosis not present

## 2023-04-24 LAB — CBC
HCT: 35.5 % — ABNORMAL LOW (ref 36.0–46.0)
Hemoglobin: 11.8 g/dL — ABNORMAL LOW (ref 12.0–15.0)
MCH: 30.6 pg (ref 26.0–34.0)
MCHC: 33.2 g/dL (ref 30.0–36.0)
MCV: 92.2 fL (ref 80.0–100.0)
Platelets: 264 10*3/uL (ref 150–400)
RBC: 3.85 MIL/uL — ABNORMAL LOW (ref 3.87–5.11)
RDW: 14.4 % (ref 11.5–15.5)
WBC: 6.4 10*3/uL (ref 4.0–10.5)
nRBC: 0 % (ref 0.0–0.2)

## 2023-04-24 LAB — GLUCOSE, CAPILLARY
Glucose-Capillary: 91 mg/dL (ref 70–99)
Glucose-Capillary: 147 mg/dL — ABNORMAL HIGH (ref 70–99)

## 2023-04-24 NOTE — Progress Notes (Signed)
PROGRESS NOTE    Alicia [name]  WUJ:[id number] DOB: 01/29/1936 DOA: 04/17/2023 PCP: [name], MD    Chief Complaint  Patient presents with   Leg Injury   Fall    Brief Narrative:  Patient is a 87 year old female history of advanced dementia presented from memory care unit following mechanical fall.  Workup in the ED concerning for left hip fracture.  Orthopedics consulted, patient transferred to [facility] to be assessed by her primary orthopedic surgeon, Dr. [name].  Patient subsequently underwent left bipolar hip hemiarthroplasty 04/19/2023.  Patient also noted to be constipated and placed on a bowel regimen.   Assessment & Plan:   Principal Problem:   Hip fracture (HCC) Active Problems:   [name]   Closed displaced fracture of left femoral neck (HCC)   Fracture of femoral neck, left (HCC)   Hyperlipidemia   Depression   Recurrent falls   Constipation   Hypokalemia  #1 left femoral neck fracture -Secondary to mechanical fall. -Patient seen in consultation by orthopedics and patient underwent a left hip bipolar hemiarthroplasty for femoral neck fracture per orthopedics, Dr. [name]. -PT/OT. -Per orthopedics WBAT with walker. -Pain management, DVT prophylaxis per orthopedics. -Awaiting SNF placement.  -Per orthopedics.  2.  Mild hypokalemia -Repleted.  3.  Hyperlipidemia -Statin.   4.  Iron deficiency anemia -H&H currently stable. -May consider oral iron supplementation on discharge. -Follow.  5.  Dementia/depression -Stable.   -Continue Aricept, Namenda, Remeron, Zoloft.   -Seroquel nightly as needed agitation.   6.  Constipation -Significant stool burden seen on CT abdomen. -Status post fleets enema x 1. -Patient with response to enema and current bowel regimen. -Continue current bowel regimen of Senokot as twice daily, MiraLAX twice daily.    7.  Recurrent falls -Patient with multiple falls over the past several days  prior to admission and recently evaluated by PT on 517 when she presented to the ED, SNF recommended however family refused. -Patient seen by PT OT who are recommending SNF placement.  -Family in agreement with SNF placement. -TOC consultation placed. -Awaiting SNF placement.    DVT prophylaxis: Postop DVT prophylaxis per orthopedics. Code Status: DNR Family Communication: Updated patient, no family at bedside.  Disposition: Medically stable since 04/22/2023.  Awaiting placement at SNF.    Status is: Inpatient Remains inpatient appropriate because: Severity of illness.  Unsafe disposition   Consultants:  Orthopedics: Dr. [name] 04/18/2023 Palliative care: Dr. Patterson Hammersmith 04/18/2023  Procedures:  CT left hip 04/17/2023 CT head 04/17/2023 Chest x-ray 04/20/2023 Plain films of the pelvis 04/19/2023 Plain films of the left tib-fib 04/17/2023 Plain films of the left femur 04/17/2023 Plain films of bilateral hips and pelvis 04/17/2023. Left hip bipolar hemiarthroplasty for femoral neck fracture per orthopedics: Dr. [name] 04/19/2023  Antimicrobials:  Anti-infectives (From admission, onward)    Start     Dose/Rate Route Frequency Ordered Stop   04/19/23 0600  ceFAZolin (ANCEF) IVPB 2g/100 mL premix        2 g 200 mL/hr over 30 Minutes Intravenous On call to O.R. 04/18/23 1436 04/19/23 1749         Subjective: Laying in bed.  Pleasantly confused.   Objective: Vitals:   04/23/23 1918 04/23/23 2034 04/24/23 0415 04/24/23 0826  BP: 104/68 132/78 120/65 125/69  Pulse: 78 76 65 62  Resp: 16 16 16 18   Temp: 98.5 F (36.9 C) 98.6 F (37 C) 98.1 F (36.7 C) 97.8 F (36.6 C)  TempSrc: Oral Oral Oral  SpO2: 94% 93% 94% 92%  Weight:      Height:       No intake or output data in the 24 hours ending 04/24/23 0914  Filed Weights   04/17/23 1600  Weight: 59 kg    Examination:  General exam: NAD. Respiratory system: CTAB anterior lung fields.  No wheezes, no crackles, no rhonchi.  Fair  air movement.  Speaking in full sentences.   Cardiovascular system: RRR no murmurs rubs or gallops.  No JVD.  No lower extremity edema.  Gastrointestinal system: Abdomen is soft, nontender, nondistended, positive bowel sounds.  No rebound.  No guarding.  Central nervous system: Alert. No focal neurological deficits. Extremities: Left lower extremity in knee immobilizer.  Skin: No rashes, lesions or ulcers Psychiatry: Judgement and insight appear poor. Mood & affect appropriate.     Data Reviewed: I have personally reviewed following labs and imaging studies  CBC: Recent Labs  Lab 04/17/23 1740 04/18/23 0526 04/19/23 0620 04/20/23 0655 04/22/23 0424 04/23/23 0610 04/24/23 0501  WBC 7.7   < > 4.8 6.4 6.5 6.3 6.4  NEUTROABS 5.7  --   --  5.4  --   --   --   HGB 12.6   < > 11.4* 13.1 12.8 12.5 11.8*  HCT 38.8   < > 34.3* 39.5 37.6 37.0 35.5*  MCV 94.9   < > 93.2 92.3 92.8 91.4 92.2  PLT 166   < > 161 204 201 243 264   < > = values in this interval not displayed.     Basic Metabolic Panel: Recent Labs  Lab 04/17/23 1740 04/18/23 0526 04/19/23 0620 04/20/23 0655 04/21/23 0559 04/22/23 0424  NA 139 137 137 137  --  136  K 3.5 3.1* 3.9 4.8  --  4.1  CL 103 109 105 104  --  102  CO2 28 22 24 22   --  24  GLUCOSE 100* 91 92 115*  --  87  BUN 18 11 11 12   --  18  CREATININE 0.59 0.44 0.56 0.54  --  0.58  CALCIUM 9.0 7.5* 8.8* 9.1  --  9.2  MG  --   --   --   --  2.0  --      GFR: Estimated Creatinine Clearance: 48.8 mL/min (by C-G formula based on SCr of 0.58 mg/dL).  Liver Function Tests: Recent Labs  Lab 04/17/23 1740  AST 18  ALT 15  ALKPHOS 50  BILITOT 1.7*  PROT 7.2  ALBUMIN 3.7     CBG: Recent Labs  Lab 04/19/23 0459 04/23/23 0811 04/23/23 1229 04/23/23 1604 04/24/23 0824  GLUCAP 91 104* 90 122* 91      Recent Results (from the past 240 hour(s))  Surgical pcr screen     Status: None   Collection Time: 04/19/23  4:51 AM   Specimen: Nasal  Mucosa; Nasal Swab  Result Value Ref Range Status   MRSA, PCR NEGATIVE NEGATIVE Final   Staphylococcus aureus NEGATIVE NEGATIVE Final    Comment: (NOTE) The Xpert SA Assay (FDA approved for NASAL specimens in patients 17 years of age and older), is one component of a comprehensive surveillance program. It is not intended to diagnose infection nor to guide or monitor treatment. Performed at [facility], [address], [place], [place] [address]          Radiology Studies: No results found.      Scheduled Meds:  aspirin EC  325  mg Oral Q breakfast   cholecalciferol  2,000 Units Oral Daily   docusate sodium  100 mg Oral BID   donepezil  5 mg Oral Daily   feeding supplement  237 mL Oral BID BM   melatonin  3 mg Oral QHS   memantine  10 mg Oral BID   mirtazapine  7.5 mg Oral QHS   multivitamin with minerals  1 tablet Oral Daily   polyethylene glycol  17 g Oral BID   rosuvastatin  5 mg Oral Daily   senna-docusate  2 tablet Oral QHS   sertraline  25 mg Oral Daily   sorbitol, milk of mag, mineral oil, glycerin (SMOG) enema  960 mL Rectal Once   Continuous Infusions:   LOS: 7 days    Time spent: 35 minutes    Ramiro Harvest, MD [facility]   To contact the attending provider between 7A-7P or the covering provider during after hours 7P-7A, please log into the web site [email or web address] and access using universal [place] password for that web site. If you do not have the password, please call the hospital operator.  04/24/2023, 9:14 AM

## 2023-04-25 DIAGNOSIS — E785 Hyperlipidemia, unspecified: Secondary | ICD-10-CM | POA: Diagnosis not present

## 2023-04-25 DIAGNOSIS — S72002P Fracture of unspecified part of neck of left femur, subsequent encounter for closed fracture with malunion: Secondary | ICD-10-CM | POA: Diagnosis not present

## 2023-04-25 DIAGNOSIS — F32A Depression, unspecified: Secondary | ICD-10-CM | POA: Diagnosis not present

## 2023-04-25 DIAGNOSIS — G309 Alzheimer's disease, unspecified: Secondary | ICD-10-CM | POA: Diagnosis not present

## 2023-04-25 MED ORDER — ENSURE ENLIVE PO LIQD
237.0000 mL | Freq: Three times a day (TID) | ORAL | Status: DC
  Administered 2023-04-26: 237 mL via ORAL

## 2023-04-25 NOTE — TOC Progression Note (Signed)
Transition of Care [facility]) - Progression Note    Patient Details  Name: Alicia [name] MRN: [id number] Date of Birth: 12-30-1935  Transition of [facility]) CM/SW Contact  Laelyn Blumenthal A [place], [place] Phone Number: 04/25/2023, 1:05 PM  Clinical Narrative:     CSW received authorization approval for SNF. [facility] updated. Facility stated they can accept pt tomorrow.  Provider, pt and pt's family updated.   Barriers to discharge: Bed availability.   TOC will continue to follow.   Expected Discharge Plan: Skilled Nursing Facility Barriers to Discharge: Continued Medical Work up, SNF Pending bed offer, Insurance Authorization  Expected Discharge Plan and Services In-house Referral: Clinical Social Work     Living arrangements for the past 2 months:  (Memory Care) Expected Discharge Date: 04/22/23                                     Social Determinants of Health (SDOH) Interventions SDOH Screenings   Food Insecurity: Patient Unable To Answer (04/18/2023)  Transportation Needs: Patient Unable To Answer (04/18/2023)  Utilities: Patient Unable To Answer (04/18/2023)  Tobacco Use: Medium Risk (04/20/2023)    Readmission Risk Interventions     No data to display

## 2023-04-25 NOTE — Progress Notes (Signed)
Occupational Therapy Treatment Patient Details Name: Alicia [name] MRN: [id number] DOB: [birth year] Today's Date: 04/25/2023   History of present illness 87 yo female presents to [facility] from SNF memory care unit on 5/20 with L hip fx from fall. S/p L hip hemiarthroplasty posterior approach on 5/22. PMH includes dementia, spinal stenosis, syncope.   OT comments  Patient with fair progress toward patient focused goals.  KI removed and skin checked, no red areas noted.  Patient able to actively flex hip and knee within precautions with Min A.  Overall Max A for bed mobility, but able to feed herself with setup.  OT to continue efforts in the acute setting to address deficits, and Patient will benefit from continued inpatient follow up therapy, <3 hours/day    Recommendations for follow up therapy are one component of a multi-disciplinary discharge planning process, led by the attending physician.  Recommendations may be updated based on patient status, additional functional criteria and insurance authorization.    Assistance Recommended at Discharge Frequent or constant Supervision/Assistance  Patient can return home with the following  Two people to help with walking and/or transfers;A lot of help with bathing/dressing/bathroom;Assistance with feeding   Equipment Recommendations  None recommended by OT    Recommendations for Other Services      Precautions / Restrictions Precautions Precautions: Fall;Posterior Hip Precaution Comments: advanced dementia Required Braces or Orthoses: Knee Immobilizer - Left Restrictions Weight Bearing Restrictions: No       Mobility Bed Mobility Overal bed mobility: Needs Assistance Bed Mobility: Supine to Sit, Sit to Supine     Supine to sit: Max assist, HOB elevated Sit to supine: Max assist, HOB elevated        Transfers                         Balance Overall balance assessment: Needs assistance, History of  Falls Sitting-balance support: No upper extremity supported, Feet supported Sitting balance-Leahy Scale: Fair                                     ADL either performed or assessed with clinical judgement   ADL   Eating/Feeding: Minimal assistance;Bed level   Grooming: Wash/dry hands;Wash/dry face;Minimal assistance;Bed level               Lower Body Dressing: Bed level;Maximal assistance                      Extremity/Trunk Assessment Upper Extremity Assessment Upper Extremity Assessment: Overall WFL for tasks assessed   Lower Extremity Assessment Lower Extremity Assessment: Defer to PT evaluation                          Cognition Arousal/Alertness: Awake/alert Behavior During Therapy: Restless Overall Cognitive Status: History of cognitive impairments - at baseline                                                             Pertinent Vitals/ Pain       Pain Assessment Pain Assessment: Faces Faces Pain Scale: Hurts even more Pain Location: L hip, during mobility Pain Descriptors / Indicators:  Grimacing Pain Intervention(s): Monitored during session                                                          Frequency  Min 2X/week        Progress Toward Goals  OT Goals(current goals can now be found in the care plan section)  Progress towards OT goals: Progressing toward goals  Acute Rehab OT Goals OT Goal Formulation: With family Time For Goal Achievement: 05/04/23 Potential to Achieve Goals: Fair  Plan Discharge plan remains appropriate    Co-evaluation                 AM-PAC OT "6 Clicks" Daily Activity     Outcome Measure   Help from another person eating meals?: A Little Help from another person taking care of personal grooming?: A Little Help from another person toileting, which includes using toliet, bedpan, or urinal?: Total Help from another person  bathing (including washing, rinsing, drying)?: A Lot Help from another person to put on and taking off regular upper body clothing?: A Lot Help from another person to put on and taking off regular lower body clothing?: Total 6 Click Score: 12    End of Session    OT Visit Diagnosis: Unsteadiness on feet (R26.81);Pain Pain - Right/Left: Left Pain - part of body: Leg;Hip   Activity Tolerance Patient tolerated treatment well   Patient Left in bed;with call bell/phone within reach;with bed alarm set;with family/visitor present   Nurse Communication Mobility status        Time: 2130-8657 OT Time Calculation (min): 18 min  Charges: OT General Charges $OT Visit: 1 Visit OT Treatments $Self Care/Home Management : 8-22 mins  04/25/2023  RP, OTR/L  Acute Rehabilitation Services  Office:  [phone]   Suzanna Obey 04/25/2023, 1:23 PM

## 2023-04-25 NOTE — Progress Notes (Signed)
Physical Therapy Treatment Patient Details Name: Alicia [name] MRN: [id number] DOB: November 15, 1936 Today's Date: 04/25/2023   History of Present Illness 87 yo female presents to [facility] from SNF memory care unit on 5/20 with L hip fx from fall. S/p L hip hemiarthroplasty posterior approach on 5/22. PMH includes dementia, spinal stenosis, syncope.    PT Comments    Pt pleasant throughout session, Pt tolerated transfer into stand x3 this date, requires significant physical assist and minimizes WB on LLE suspect due to pain. PT assisting with maintaining hip precautions throughout session. Pt tolerated LLE exercises well, and follows commands some of the time with multimodal cuing  during. Plan remains appropriate, PT to continue to follow.     Recommendations for follow up therapy are one component of a multi-disciplinary discharge planning process, led by the attending physician.  Recommendations may be updated based on patient status, additional functional criteria and insurance authorization.  Follow Up Recommendations  Can patient physically be transported by private vehicle: Yes    Assistance Recommended at Discharge Frequent or constant Supervision/Assistance  Patient can return home with the following A lot of help with walking and/or transfers;A lot of help with bathing/dressing/bathroom;Assistance with cooking/housework;Assistance with feeding;Direct supervision/assist for medications management;Direct supervision/assist for financial management;Assist for transportation;Help with stairs or ramp for entrance   Equipment Recommendations  None recommended by PT    Recommendations for Other Services       Precautions / Restrictions Precautions Precautions: Fall;Posterior Hip Precaution Comments: advanced dementia; PT verbally reviewed posterior hip precautions with pt and husband, L KI in place for precautions maintenance, and pillow utilized as abduction pillow once in  recliner Required Braces or Orthoses: Knee Immobilizer - Left Restrictions Weight Bearing Restrictions: No     Mobility  Bed Mobility Overal bed mobility: Needs Assistance Bed Mobility: Supine to Sit, Sit to Supine     Supine to sit: Max assist, HOB elevated Sit to supine: Max assist, HOB elevated   General bed mobility comments: max assist for trunk and LE management, boost up in bed.    Transfers Overall transfer level: Needs assistance Equipment used: Rolling walker (2 wheels), 1 person hand held assist Transfers: Sit to/from Stand Sit to Stand: Max assist           General transfer comment: assist for power up, rise, steadying. stand x3 from EOB, pt guarding LLE suspect due to pain    Ambulation/Gait               General Gait Details: nt - pt limited in tolerance post-op   Stairs             Wheelchair Mobility    Modified Rankin (Stroke Patients Only)       Balance Overall balance assessment: Needs assistance, History of Falls Sitting-balance support: No upper extremity supported, Feet supported Sitting balance-Leahy Scale: Fair     Standing balance support: Bilateral upper extremity supported, During functional activity Standing balance-Leahy Scale: Poor                              Cognition Arousal/Alertness: Awake/alert Behavior During Therapy: Anxious Overall Cognitive Status: History of cognitive impairments - at baseline                                 General Comments: responds best to multimodal cuing given history of dementia  Exercises Total Joint Exercises Ankle Circles/Pumps: AAROM, Both, 15 reps, Supine Heel Slides: AAROM, Left, 15 reps, Supine Hip ABduction/ADduction: AAROM, Left, 15 reps, Supine Straight Leg Raises: AAROM, Left, 15 reps, Supine    General Comments        Pertinent Vitals/Pain Pain Assessment Pain Assessment: Faces Faces Pain Scale: Hurts little more Pain  Location: L hip, during mobility Pain Descriptors / Indicators: Grimacing, Restless, Guarding Pain Intervention(s): Limited activity within patient's tolerance, Monitored during session, Repositioned    Home Living                          Prior Function            PT Goals (current goals can now be found in the care plan section) Acute Rehab PT Goals PT Goal Formulation: With patient/family Time For Goal Achievement: 05/04/23 Potential to Achieve Goals: Good Progress towards PT goals: Progressing toward goals    Frequency    Min 2X/week      PT Plan Current plan remains appropriate    Co-evaluation              AM-PAC PT "6 Clicks" Mobility   Outcome Measure  Help needed turning from your back to your side while in a flat bed without using bedrails?: A Lot Help needed moving from lying on your back to sitting on the side of a flat bed without using bedrails?: A Lot Help needed moving to and from a bed to a chair (including a wheelchair)?: A Lot Help needed standing up from a chair using your arms (e.g., wheelchair or bedside chair)?: A Lot Help needed to walk in hospital room?: Total Help needed climbing 3-5 steps with a railing? : Total 6 Click Score: 10    End of Session Equipment Utilized During Treatment: Gait belt;Left knee immobilizer Activity Tolerance: Patient tolerated treatment well Patient left: with call bell/phone within reach;with family/visitor present;in chair;with chair alarm set Nurse Communication: Mobility status PT Visit Diagnosis: Other abnormalities of gait and mobility (R26.89);Repeated falls (R29.6);Pain Pain - Right/Left: Left Pain - part of body: Hip     Time: 0981-1914 PT Time Calculation (min) (ACUTE ONLY): 21 min  Charges:  $Therapeutic Activity: 8-22 mins                     Marye Round, PT DPT Acute Rehabilitation Services Secure Chat Preferred  Office [phone]   Tarun Patchell E Stroup 04/25/2023, 10:15 AM

## 2023-04-25 NOTE — Progress Notes (Signed)
PROGRESS NOTE    Alicia [name]  WUJ:[id number] DOB: Feb 23, 1936 DOA: 04/17/2023 PCP: [name], MD    Chief Complaint  Patient presents with   Leg Injury   Fall    Brief Narrative:  Patient is a 87 year old female history of advanced dementia presented from memory care unit following mechanical fall.  Workup in the ED concerning for left hip fracture.  Orthopedics consulted, patient transferred to [facility] to be assessed by her primary orthopedic surgeon, Dr. [name].  Patient subsequently underwent left bipolar hip hemiarthroplasty 04/19/2023.  Patient also noted to be constipated and placed on a bowel regimen.   Assessment & Plan:   Principal Problem:   Hip fracture (HCC) Active Problems:   [name]   Closed displaced fracture of left femoral neck (HCC)   Fracture of femoral neck, left (HCC)   Hyperlipidemia   Depression   Recurrent falls   Constipation   Hypokalemia  #1 left femoral neck fracture -Secondary to mechanical fall. -Patient seen in consultation by orthopedics and patient underwent a left hip bipolar hemiarthroplasty for femoral neck fracture per orthopedics, Dr. [name]. -PT/OT. -Per orthopedics WBAT with walker. -Pain management, DVT prophylaxis per orthopedics. -Awaiting SNF placement.  -Per orthopedics.  2.  Mild hypokalemia -Repleted.  3.  Hyperlipidemia -Continue statin.    4.  Iron deficiency anemia -H&H currently stable. -May consider oral iron supplementation on discharge. -Follow.  5.  Dementia/depression -Stable.   -Continue Namenda, Aricept, Remeron, Zoloft.   -Seroquel nightly as needed for agitation.    6.  Constipation -Significant stool burden seen on CT abdomen. -Status post fleets enema x 1. -Patient with good response to enema and on current bowel regimen.   -Continue Senokot-S twice daily, MiraLAX twice daily.    7.  Recurrent falls -Patient with multiple falls over the past several days prior  to admission and recently evaluated by PT on 517 when she presented to the ED, SNF recommended however family refused. -Patient seen by PT/ OT who are recommending SNF placement.  -Family in agreement with SNF placement. -TOC consultation placed. -Awaiting SNF placement.    DVT prophylaxis: Postop DVT prophylaxis per orthopedics. Code Status: DNR Family Communication: Updated patient, no family at bedside.  Disposition: Medically stable since 04/22/2023.  Awaiting placement at SNF.    Status is: Inpatient Remains inpatient appropriate because: Severity of illness.  Unsafe disposition   Consultants:  Orthopedics: Dr. [name] 04/18/2023 Palliative care: Dr. Patterson Hammersmith 04/18/2023  Procedures:  CT left hip 04/17/2023 CT head 04/17/2023 Chest x-ray 04/20/2023 Plain films of the pelvis 04/19/2023 Plain films of the left tib-fib 04/17/2023 Plain films of the left femur 04/17/2023 Plain films of bilateral hips and pelvis 04/17/2023. Left hip bipolar hemiarthroplasty for femoral neck fracture per orthopedics: Dr. [name] 04/19/2023  Antimicrobials:  Anti-infectives (From admission, onward)    Start     Dose/Rate Route Frequency Ordered Stop   04/19/23 0600  ceFAZolin (ANCEF) IVPB 2g/100 mL premix        2 g 200 mL/hr over 30 Minutes Intravenous On call to O.R. 04/18/23 1436 04/19/23 1749         Subjective: Laying in bed.  Pleasantly confused.  Denies any chest pain or shortness of breath.   Objective: Vitals:   04/24/23 1727 04/24/23 2008 04/25/23 0537 04/25/23 0732  BP: 120/65 111/74 (!) 147/79 (!) 144/81  Pulse: 65 66 62 61  Resp: 16 18 17 16   Temp: 98.1 F (36.7 C) 98.2 F (36.8 C)  98.1 F (36.7 C) 97.7 F (36.5 C)  TempSrc: Oral Oral Oral   SpO2:  95% 100% 95%  Weight:      Height:       No intake or output data in the 24 hours ending 04/25/23 0901  Filed Weights   04/17/23 1600  Weight: 59 kg    Examination:  General exam: NAD. Respiratory system: Lungs clear to  auscultation bilaterally anterior lung fields.  No wheezes, no crackles, no rhonchi.  Fair air movement.  Speaking in full sentences.    Cardiovascular system: Regular rate rhythm no murmurs rubs or gallops.  No JVD.  No lower extremity edema. Gastrointestinal system: Abdomen is soft, nontender, nondistended, positive bowel sounds.  No rebound.  No guarding. Central nervous system: Alert. No focal neurological deficits. Extremities: Left lower extremity in knee immobilizer.  Skin: No rashes, lesions or ulcers Psychiatry: Judgement and insight appear poor. Mood & affect appropriate.     Data Reviewed: I have personally reviewed following labs and imaging studies  CBC: Recent Labs  Lab 04/19/23 0620 04/20/23 0655 04/22/23 0424 04/23/23 0610 04/24/23 0501  WBC 4.8 6.4 6.5 6.3 6.4  NEUTROABS  --  5.4  --   --   --   HGB 11.4* 13.1 12.8 12.5 11.8*  HCT 34.3* 39.5 37.6 37.0 35.5*  MCV 93.2 92.3 92.8 91.4 92.2  PLT 161 204 201 243 264     Basic Metabolic Panel: Recent Labs  Lab 04/19/23 0620 04/20/23 0655 04/21/23 0559 04/22/23 0424  NA 137 137  --  136  K 3.9 4.8  --  4.1  CL 105 104  --  102  CO2 24 22  --  24  GLUCOSE 92 115*  --  87  BUN 11 12  --  18  CREATININE 0.56 0.54  --  0.58  CALCIUM 8.8* 9.1  --  9.2  MG  --   --  2.0  --      GFR: Estimated Creatinine Clearance: 48.8 mL/min (by C-G formula based on SCr of 0.58 mg/dL).  Liver Function Tests: No results for input(s): "AST", "ALT", "ALKPHOS", "BILITOT", "PROT", "ALBUMIN" in the last 168 hours.   CBG: Recent Labs  Lab 04/23/23 0811 04/23/23 1229 04/23/23 1604 04/24/23 0824 04/24/23 1218  GLUCAP 104* 90 122* 91 147*      Recent Results (from the past 240 hour(s))  Surgical pcr screen     Status: None   Collection Time: 04/19/23  4:51 AM   Specimen: Nasal Mucosa; Nasal Swab  Result Value Ref Range Status   MRSA, PCR NEGATIVE NEGATIVE Final   Staphylococcus aureus NEGATIVE NEGATIVE Final     Comment: (NOTE) The Xpert SA Assay (FDA approved for NASAL specimens in patients 57 years of age and older), is one component of a comprehensive surveillance program. It is not intended to diagnose infection nor to guide or monitor treatment. Performed at [facility], [address], [place], [place] [address]          Radiology Studies: No results found.      Scheduled Meds:  aspirin EC  325 mg Oral Q breakfast   cholecalciferol  2,000 Units Oral Daily   docusate sodium  100 mg Oral BID   donepezil  5 mg Oral Daily   feeding supplement  237 mL Oral BID BM   melatonin  3 mg Oral QHS   memantine  10 mg Oral BID   mirtazapine  7.5 mg  Oral QHS   multivitamin with minerals  1 tablet Oral Daily   polyethylene glycol  17 g Oral BID   rosuvastatin  5 mg Oral Daily   senna-docusate  2 tablet Oral QHS   sertraline  25 mg Oral Daily   sorbitol, milk of mag, mineral oil, glycerin (SMOG) enema  960 mL Rectal Once   Continuous Infusions:   LOS: 8 days    Time spent: 35 minutes    Ramiro Harvest, MD [facility]   To contact the attending provider between 7A-7P or the covering provider during after hours 7P-7A, please log into the web site [email or web address] and access using universal [place] password for that web site. If you do not have the password, please call the hospital operator.  04/25/2023, 9:01 AM

## 2023-04-25 NOTE — Progress Notes (Signed)
Nutrition Follow-up  DOCUMENTATION CODES:   Non-severe (moderate) malnutrition in context of chronic illness  INTERVENTION:  - Increase to Ensure Enlive po TID, each supplement provides 350 kcal and 20 grams of protein.  NUTRITION DIAGNOSIS:   Moderate Malnutrition related to chronic illness as evidenced by moderate muscle depletion, severe muscle depletion.  GOAL:   Patient will meet greater than or equal to 90% of their needs  MONITOR:   PO intake, Supplement acceptance  REASON FOR ASSESSMENT:   Consult Hip fracture protocol  ASSESSMENT:   87 y.o. female admits related to leg injury and fall. PMH includes: alzheimer disease, HTN, advanced dementia. Pt is currently receiving medical management related to fall.  Meds reviewed:  Vit D3, colace, remeron, MVI, senokot. Labs reviewed: WDL.   Pt was sleeping at time of assessment. Pt's family member was at bedside. He reports that the pt did pretty well with breakfast this am. Per record, pt ate 30% of her breakfast tray. He also reports that she has been drinking the Ensure shakes. RD will increase to TID.   NUTRITION - FOCUSED PHYSICAL EXAM:  Flowsheet Row Most Recent Value  Orbital Region Moderate depletion  Upper Arm Region Moderate depletion  Thoracic and Lumbar Region Unable to assess  Buccal Region Moderate depletion  Temple Region Severe depletion  Clavicle Bone Region Severe depletion  Clavicle and Acromion Bone Region Unable to assess  Scapular Bone Region Unable to assess  Dorsal Hand Moderate depletion  Patellar Region Moderate depletion  Anterior Thigh Region Moderate depletion  Posterior Calf Region Moderate depletion  Edema (RD Assessment) None  Hair Reviewed  Eyes Reviewed  Mouth Reviewed  Skin Reviewed  Nails Reviewed       Diet Order:   Diet Order             Diet general           Diet regular Room service appropriate? Yes; Fluid consistency: Thin  Diet effective now                    EDUCATION NEEDS:   Not appropriate for education at this time  Skin:  Skin Assessment: Reviewed RN Assessment  Last BM:  5/27 - type 6  Height:   Ht Readings from Last 1 Encounters:  04/17/23 5\' 6"  (1.676 m)    Weight:   Wt Readings from Last 1 Encounters:  04/17/23 59 kg    Ideal Body Weight:     BMI:  Body mass index is 20.98 kg/m.  Estimated Nutritional Needs:   Kcal:  1800-2000 kcals  Protein:  90-100 gm  Fluid:  >/= 1.8 L  Bethann Humble, RD, LDN, CNSC.

## 2023-04-26 ENCOUNTER — Telehealth: Payer: Self-pay | Admitting: Orthopaedic Surgery

## 2023-04-26 DIAGNOSIS — D509 Iron deficiency anemia, unspecified: Secondary | ICD-10-CM | POA: Diagnosis not present

## 2023-04-26 DIAGNOSIS — Z9181 History of falling: Secondary | ICD-10-CM | POA: Diagnosis not present

## 2023-04-26 DIAGNOSIS — E440 Moderate protein-calorie malnutrition: Secondary | ICD-10-CM | POA: Diagnosis not present

## 2023-04-26 DIAGNOSIS — M48061 Spinal stenosis, lumbar region without neurogenic claudication: Secondary | ICD-10-CM | POA: Diagnosis not present

## 2023-04-26 DIAGNOSIS — R1310 Dysphagia, unspecified: Secondary | ICD-10-CM | POA: Diagnosis not present

## 2023-04-26 DIAGNOSIS — Z4789 Encounter for other orthopedic aftercare: Secondary | ICD-10-CM | POA: Diagnosis not present

## 2023-04-26 DIAGNOSIS — R296 Repeated falls: Secondary | ICD-10-CM | POA: Diagnosis not present

## 2023-04-26 DIAGNOSIS — G309 Alzheimer's disease, unspecified: Secondary | ICD-10-CM | POA: Diagnosis not present

## 2023-04-26 DIAGNOSIS — E876 Hypokalemia: Secondary | ICD-10-CM | POA: Diagnosis not present

## 2023-04-26 DIAGNOSIS — S72002P Fracture of unspecified part of neck of left femur, subsequent encounter for closed fracture with malunion: Secondary | ICD-10-CM | POA: Diagnosis not present

## 2023-04-26 DIAGNOSIS — R5383 Other fatigue: Secondary | ICD-10-CM | POA: Diagnosis not present

## 2023-04-26 DIAGNOSIS — G4700 Insomnia, unspecified: Secondary | ICD-10-CM | POA: Diagnosis not present

## 2023-04-26 DIAGNOSIS — Z743 Need for continuous supervision: Secondary | ICD-10-CM | POA: Diagnosis not present

## 2023-04-26 DIAGNOSIS — R52 Pain, unspecified: Secondary | ICD-10-CM | POA: Diagnosis not present

## 2023-04-26 DIAGNOSIS — S72042D Displaced fracture of base of neck of left femur, subsequent encounter for closed fracture with routine healing: Secondary | ICD-10-CM | POA: Diagnosis not present

## 2023-04-26 DIAGNOSIS — E559 Vitamin D deficiency, unspecified: Secondary | ICD-10-CM | POA: Diagnosis not present

## 2023-04-26 DIAGNOSIS — Z7401 Bed confinement status: Secondary | ICD-10-CM | POA: Diagnosis not present

## 2023-04-26 DIAGNOSIS — M8588 Other specified disorders of bone density and structure, other site: Secondary | ICD-10-CM | POA: Diagnosis not present

## 2023-04-26 DIAGNOSIS — E860 Dehydration: Secondary | ICD-10-CM | POA: Diagnosis not present

## 2023-04-26 DIAGNOSIS — N182 Chronic kidney disease, stage 2 (mild): Secondary | ICD-10-CM | POA: Diagnosis not present

## 2023-04-26 DIAGNOSIS — M1712 Unilateral primary osteoarthritis, left knee: Secondary | ICD-10-CM | POA: Diagnosis not present

## 2023-04-26 DIAGNOSIS — R4701 Aphasia: Secondary | ICD-10-CM | POA: Diagnosis not present

## 2023-04-26 DIAGNOSIS — R627 Adult failure to thrive: Secondary | ICD-10-CM | POA: Diagnosis not present

## 2023-04-26 DIAGNOSIS — E870 Hyperosmolality and hypernatremia: Secondary | ICD-10-CM | POA: Diagnosis not present

## 2023-04-26 DIAGNOSIS — R634 Abnormal weight loss: Secondary | ICD-10-CM | POA: Diagnosis not present

## 2023-04-26 DIAGNOSIS — M6281 Muscle weakness (generalized): Secondary | ICD-10-CM | POA: Diagnosis not present

## 2023-04-26 DIAGNOSIS — I10 Essential (primary) hypertension: Secondary | ICD-10-CM | POA: Diagnosis not present

## 2023-04-26 DIAGNOSIS — S72002A Fracture of unspecified part of neck of left femur, initial encounter for closed fracture: Secondary | ICD-10-CM | POA: Diagnosis not present

## 2023-04-26 DIAGNOSIS — R531 Weakness: Secondary | ICD-10-CM | POA: Diagnosis not present

## 2023-04-26 DIAGNOSIS — M160 Bilateral primary osteoarthritis of hip: Secondary | ICD-10-CM | POA: Diagnosis not present

## 2023-04-26 DIAGNOSIS — E785 Hyperlipidemia, unspecified: Secondary | ICD-10-CM | POA: Diagnosis not present

## 2023-04-26 DIAGNOSIS — K5900 Constipation, unspecified: Secondary | ICD-10-CM | POA: Diagnosis not present

## 2023-04-26 DIAGNOSIS — R2681 Unsteadiness on feet: Secondary | ICD-10-CM | POA: Diagnosis not present

## 2023-04-26 DIAGNOSIS — L89312 Pressure ulcer of right buttock, stage 2: Secondary | ICD-10-CM | POA: Diagnosis not present

## 2023-04-26 DIAGNOSIS — F32A Depression, unspecified: Secondary | ICD-10-CM | POA: Diagnosis not present

## 2023-04-26 DIAGNOSIS — R1312 Dysphagia, oropharyngeal phase: Secondary | ICD-10-CM | POA: Diagnosis not present

## 2023-04-26 MED ORDER — ASPIRIN 81 MG PO CHEW: 324.0000 mg | CHEWABLE_TABLET | Freq: Every day | ORAL | Status: DC

## 2023-04-26 MED ORDER — ASPIRIN 81 MG PO CHEW: 324.0000 mg | CHEWABLE_TABLET | Freq: Every day | ORAL | Status: AC

## 2023-04-26 MED ORDER — ENSURE ENLIVE PO LIQD: 237.0000 mL | Freq: Three times a day (TID) | ORAL | Status: AC

## 2023-04-26 NOTE — Discharge Summary (Addendum)
Physician Discharge Summary  Alicia [name] WUJ:[id number] DOB: 22-Jun-1936 DOA: 04/17/2023  PCP: [name], MD  Admit date: 04/17/2023 Discharge date: 04/26/2023  Admitted From: Assisted Living facility.  Discharge disposition: Skilled nursing facility  Recommendations for Outpatient Follow-Up:   Follow up with your primary care provider at the skilled nursing facility in 3 to 5 days. Follow-up with Dr. [name] orthopedics in 1 week. Check CBC, BMP, magnesium in the next visit  Discharge Diagnosis:   Principal Problem:   Hip fracture (HCC) Active Problems:   [name]   Closed displaced fracture of left femoral neck (HCC)   Fracture of femoral neck, left (HCC)   Hyperlipidemia   Depression   Recurrent falls   Constipation   Hypokalemia   Malnutrition of moderate degree   Discharge Condition: Improved.  Diet recommendation:  Regular.  Wound care: None.  Code status: Full.   History of Present Illness:   Patient is a 87 year old female history of advanced dementia presented from memory care unit following mechanical fall. Workup in the ED with left hip fracture. Orthopedics was consulted,and underwent left bipolar hip hemiarthroplasty 04/19/2023.  Postoperative course unremarkable.  Hospital Course:   Following conditions were addressed during hospitalization as listed below,  Left femoral neck fracture status post left hemiarthroplasty 04/19/2023 Secondary to mechanical fall.  Status post left hip bipolar hemiarthroplasty.  Plan for skilled nursing facility placement at this time.  Orthopedic recommended weightbearing as tolerated with walker.  Will to follow-up with orthopedics as outpatient in 1 week..   Mild hypokalemia Replenished.    Hyperlipidemia -Continue statin.     Iron deficiency anemia Latest hemoglobin of 11.8.   Dementia/depression Continue Namenda, Aricept, Remeron, Zoloft.      Constipation -Significant stool  burden seen on CT abdomen. -Status post fleets enema x 1. Continue bowel regimen on discharge.  Moderate protein calorie malnutrition.   Present on admission.  Seen by dietitian.Body mass index is 20.98 kg/m.  Will continue protein supplement on discharge.   Recurrent falls -Patient with multiple falls .  Waiting for skilled nursing facility.  Disposition.  At this time, patient is stable for disposition to skilled nursing facility.  Medical Consultants:   Orthopedics: Dr. [name] 04/18/2023 Palliative care: Dr. Patterson Hammersmith 04/18/2023  Procedures:    Left hip bipolar hemiarthroplasty for femoral neck fracture per orthopedics: Dr. [name] 04/19/2023  Subjective:   Today, patient was seen and examined at bedside with no complaints reported.  Has been eating with assistance.  Has underlying dementia.  Discharge Exam:   Vitals:   04/26/23 0457 04/26/23 0743  BP: (!) 157/80 (!) 155/134  Pulse: 100 63  Resp: 15 18  Temp: (!) 97.4 F (36.3 C) 98.3 F (36.8 C)  SpO2: 91% 90%   Vitals:   04/25/23 1557 04/25/23 2000 04/26/23 0457 04/26/23 0743  BP: 111/77 127/82 (!) 157/80 (!) 155/134  Pulse: 69 68 100 63  Resp: 16 15 15 18   Temp: 97.8 F (36.6 C) 97.6 F (36.4 C) (!) 97.4 F (36.3 C) 98.3 F (36.8 C)  TempSrc:  Oral Oral   SpO2: (!) 86% 99% 91% 90%  Weight:      Height:       Body mass index is 20.98 kg/m.   General: Alert awake, not in obvious distress, mildly Communicative HENT: pupils equally reacting to light,  No scleral pallor or icterus noted. Oral mucosa is moist.  Chest:  Clear breath sounds.   No crackles or wheezes.  CVS: S1 &S2 heard. No murmur.  Regular rate and rhythm. Abdomen: Soft, nontender, nondistended.  Bowel sounds are heard.   Extremities: No cyanosis, clubbing or edema.  Peripheral pulses are palpable.  Left lower extremity in knee immobilizer. Psych: Alert, awake and mildly communicative, disoriented, has underlying dementia CNS:  No cranial nerve  deficits.  Power equal in all extremities.   Skin: Warm and dry.  No rashes noted.  The results of significant diagnostics from this hospitalization (including imaging, microbiology, ancillary and laboratory) are listed below for reference.     Diagnostic Studies:   CT Hip Left Wo Contrast  Result Date: 04/17/2023 CLINICAL DATA:  Possible left hip fracture.  Hip trauma EXAM: CT OF THE LEFT HIP WITHOUT CONTRAST TECHNIQUE: Multidetector CT imaging of the left hip was performed according to the standard protocol. Multiplanar CT image reconstructions were also generated. RADIATION DOSE REDUCTION: This exam was performed according to the departmental dose-optimization program which includes automated exposure control, adjustment of the mA and/or kV according to patient size and/or use of iterative reconstruction technique. COMPARISON:  Radiographs 04/17/2023 FINDINGS: Bones/Joint/Cartilage There is cortical disruption of the anterior cortex of the femoral neck near the head neck junction (series 3/image 81 and 8/64). Irregular sclerosis at the head neck junction. Findings are suspicious for nondisplaced fracture. Degenerative changes about the left hip, SI joint, and visualized lumbar spine. Ligaments Suboptimally assessed by CT. Muscles and Tendons Unremarkable CT appearance. Soft tissues Large stool ball in the rectum with rectal mild wall thickening. Mild subcutaneous edema in the left gluteal soft tissues. IMPRESSION: 1. Findings suspicious for nondisplaced subcapital left femoral neck fracture. Consider MRI for confirmation. 2. Large stool ball in the rectum with mild rectal wall thickening suggesting impaction and stercoral colitis. Electronically Signed   By: Minerva Fester M.D.   On: 04/17/2023 18:58   DG Tibia/Fibula Left  Result Date: 04/17/2023 CLINICAL DATA:  Fall yesterday. EXAM: LEFT TIBIA AND FIBULA - 2 VIEW COMPARISON:  None Available. FINDINGS: There is no evidence of fracture or other  focal bone lesions. Soft tissues are unremarkable. IMPRESSION: Negative. Electronically Signed   By: Lupita Raider M.D.   On: 04/17/2023 18:54   DG Femur 1V Left  Result Date: 04/17/2023 CLINICAL DATA:  Fall yesterday. EXAM: LEFT FEMUR 1 VIEW COMPARISON:  None Available. FINDINGS: Minimally displaced subcapital fracture of proximal left femur cannot be excluded. Correlation with CT findings of same day is recommended. No abnormality seen involving middle and distal portions of the left femur. IMPRESSION: Findings concerning for possible minimally displaced subcapital fracture of proximal left femur. Correlation with CT findings is recommended. Electronically Signed   By: Lupita Raider M.D.   On: 04/17/2023 18:52   DG Knee Complete 4 Views Left  Result Date: 04/17/2023 CLINICAL DATA:  Larey Seat EXAM: LEFT KNEE - COMPLETE 4+ VIEW COMPARISON:  None Available. FINDINGS: Frontal, bilateral oblique, and cross-table lateral views of the left knee are obtained. Diffuse osteopenia. There are no acute displaced fractures. Mild 3 compartmental osteoarthritis. Trace joint effusion. Soft tissues are unremarkable. IMPRESSION: 1. No acute fracture. 2. Mild 3 compartmental osteoarthritis and likely reactive trace suprapatellar joint effusion. 3. Diffuse osteopenia. Electronically Signed   By: Sharlet Salina M.D.   On: 04/17/2023 17:45   DG Hips Bilat W or Wo Pelvis 3-4 Views  Result Date: 04/17/2023 CLINICAL DATA:  Larey Seat yesterday, confusion EXAM: DG HIP (WITH OR WITHOUT PELVIS) 3-4V BILAT COMPARISON:  03/26/2020 FINDINGS: Frontal view of the pelvis,  frontal and frogleg lateral views of the right hip, and frontal and cross-table lateral views of the left hip are obtained on 5 images. There is severe bilateral hip osteoarthritis. Subtle sclerosis seen in the subcapital region of the left femoral neck likely reflects a collar of osteophytes rather than nondisplaced fracture. Underlying trabecula appear intact. If  nondisplaced fracture is a concern, CT could be considered. No other signs of acute displaced fracture. Remainder of the bony pelvis is unremarkable. Large amount of stool within the rectal vault consistent with fecal impaction. IMPRESSION: 1. Severe bilateral hip osteoarthritis. 2. Left femoral neck subcapital sclerosis favors abundant osteophytes over nondisplaced fracture. The underlying trabecula appear intact. If there is concern for nondisplaced left hip fracture a CT could be performed. 3. Otherwise no acute bony abnormalities. 4. Large amount of retained stool in the rectal vault concerning for fecal impaction. Electronically Signed   By: Sharlet Salina M.D.   On: 04/17/2023 17:45   CT Head Wo Contrast  Result Date: 04/17/2023 CLINICAL DATA:  Dementia, fell yesterday EXAM: CT HEAD WITHOUT CONTRAST TECHNIQUE: Contiguous axial images were obtained from the base of the skull through the vertex without intravenous contrast. RADIATION DOSE REDUCTION: This exam was performed according to the departmental dose-optimization program which includes automated exposure control, adjustment of the mA and/or kV according to patient size and/or use of iterative reconstruction technique. COMPARISON:  04/14/2023 FINDINGS: Brain: Stable chronic small-vessel ischemic changes throughout the periventricular white matter. No evidence of acute infarct or hemorrhage. Lateral ventricles and midline structures are stable. No acute extra-axial fluid collections. No mass effect. Vascular: Stable atherosclerosis.  No hyperdense vessel. Skull: Normal. Negative for fracture or focal lesion. Sinuses/Orbits: No acute finding. Other: None. IMPRESSION: 1. Stable head CT, no acute intracranial process. Electronically Signed   By: Sharlet Salina M.D.   On: 04/17/2023 17:15     Labs:   Basic Metabolic Panel: Recent Labs  Lab 04/20/23 0655 04/21/23 0559 04/22/23 0424  NA 137  --  136  K 4.8  --  4.1  CL 104  --  102  CO2 22  --   24  GLUCOSE 115*  --  87  BUN 12  --  18  CREATININE 0.54  --  0.58  CALCIUM 9.1  --  9.2  MG  --  2.0  --    GFR Estimated Creatinine Clearance: 48.8 mL/min (by C-G formula based on SCr of 0.58 mg/dL). Liver Function Tests: No results for input(s): "AST", "ALT", "ALKPHOS", "BILITOT", "PROT", "ALBUMIN" in the last 168 hours. No results for input(s): "LIPASE", "AMYLASE" in the last 168 hours. No results for input(s): "AMMONIA" in the last 168 hours. Coagulation profile No results for input(s): "INR", "PROTIME" in the last 168 hours.  CBC: Recent Labs  Lab 04/20/23 0655 04/22/23 0424 04/23/23 0610 04/24/23 0501  WBC 6.4 6.5 6.3 6.4  NEUTROABS 5.4  --   --   --   HGB 13.1 12.8 12.5 11.8*  HCT 39.5 37.6 37.0 35.5*  MCV 92.3 92.8 91.4 92.2  PLT 204 201 243 264   Cardiac Enzymes: No results for input(s): "CKTOTAL", "CKMB", "CKMBINDEX", "TROPONINI" in the last 168 hours. BNP: Invalid input(s): "POCBNP" CBG: Recent Labs  Lab 04/23/23 0811 04/23/23 1229 04/23/23 1604 04/24/23 0824 04/24/23 1218  GLUCAP 104* 90 122* 91 147*   D-Dimer No results for input(s): "DDIMER" in the last 72 hours. Hgb A1c No results for input(s): "HGBA1C" in the last 72 hours. Lipid Profile No  results for input(s): "CHOL", "HDL", "LDLCALC", "TRIG", "CHOLHDL", "LDLDIRECT" in the last 72 hours. Thyroid function studies No results for input(s): "TSH", "T4TOTAL", "T3FREE", "THYROIDAB" in the last 72 hours.  Invalid input(s): "FREET3" Anemia work up No results for input(s): "VITAMINB12", "FOLATE", "FERRITIN", "TIBC", "IRON", "RETICCTPCT" in the last 72 hours. Microbiology Recent Results (from the past 240 hour(s))  Surgical pcr screen     Status: None   Collection Time: 04/19/23  4:51 AM   Specimen: Nasal Mucosa; Nasal Swab  Result Value Ref Range Status   MRSA, PCR NEGATIVE NEGATIVE Final   Staphylococcus aureus NEGATIVE NEGATIVE Final    Comment: (NOTE) The Xpert SA Assay (FDA approved for  NASAL specimens in patients 25 years of age and older), is one component of a comprehensive surveillance program. It is not intended to diagnose infection nor to guide or monitor treatment. Performed at [facility], [address] [place], [place] [address]      Discharge Instructions:   Discharge Instructions     Call MD for:  persistant nausea and vomiting   Complete by: As directed    Call MD for:  severe uncontrolled pain   Complete by: As directed    Call MD for:  temperature >100.4   Complete by: As directed    Diet general   Complete by: As directed    Discharge instructions   Complete by: As directed    Follow-up with your primary care provider at the skilled nursing facility in 3 to 5 days.  Follow-up with orthopedics Dr. [name] in 1 week for postsurgical checkup. Seek medical attention for worsening symptoms.   Increase activity slowly   Complete by: As directed    Increase activity slowly   Complete by: As directed    No wound care   Complete by: As directed    No wound care   Complete by: As directed       Allergies as of 04/26/2023       Reactions   Contrast Media [iodinated Contrast Media] Other (See Comments)   Husband cannot remember reaction         Medication List     TAKE these medications    acetaminophen 325 MG tablet Commonly known as: TYLENOL Take 650 mg by mouth every 6 (six) hours as needed (for pain).   aspirin 81 MG chewable tablet Chew 4 tablets (324 mg total) by mouth daily. Start taking on: Apr 27, 2023   bisacodyl 10 MG suppository Commonly known as: DULCOLAX Place 1 suppository (10 mg total) rectally daily as needed for moderate constipation.   busPIRone 5 MG tablet Commonly known as: BUSPAR Take 5 mg by mouth 2 (two) times daily as needed (for anxiety).   donepezil 5 MG tablet Commonly known as: ARICEPT TAKE ONE TABLET BY MOUTH AT bedtime What changed:  when to take this Another medication with the same name  was removed. Continue taking this medication, and follow the directions you see here.   feeding supplement Liqd Take 237 mLs by mouth 2 (two) times daily between meals.   feeding supplement Liqd Take 237 mLs by mouth 3 (three) times daily between meals.   HYDROcodone-acetaminophen 5-325 MG tablet Commonly known as: NORCO/VICODIN Take 1 tablet by mouth every 6 (six) hours as needed for moderate pain.   melatonin 3 MG Tabs tablet Take 1 tablet (3 mg total) by mouth at bedtime.   memantine 10 MG tablet Commonly known as: NAMENDA Take 1 tablet (  10 mg total) by mouth 2 (two) times daily. What changed: when to take this   mirtazapine 7.5 MG tablet Commonly known as: REMERON Take 7.5 mg by mouth at bedtime.   multivitamin with minerals Tabs tablet Take 1 tablet by mouth daily.   polyethylene glycol 17 g packet Commonly known as: MIRALAX / GLYCOLAX Take 17 g by mouth 2 (two) times daily. What changed:  when to take this reasons to take this   QUEtiapine 25 MG tablet Commonly known as: SEROQUEL Take 1 tablet (25 mg total) by mouth at bedtime as needed (agitation).   rosuvastatin 5 MG tablet Commonly known as: CRESTOR Take 5 mg by mouth daily.   senna-docusate 8.6-50 MG tablet Commonly known as: Senokot-S Take 2 tablets by mouth at bedtime.   sertraline 25 MG tablet Commonly known as: ZOLOFT Take 25 mg by mouth daily.   Vitamin D3 50 MCG (2000 UT) Tabs Take 1 tablet (2,000 Units total) by mouth daily.        Contact information for follow-up providers     Eldred Manges, MD Follow up in 1 week(s).   Specialty: Orthopedic Surgery Contact information: [address] [place] [address] [phone]         MD at SNF Follow up.               Contact information for after-discharge care     Destination     [facility] Preferred SNF .   Service: Skilled Nursing Contact information: [address] [place]  [address] [phone]                      Time coordinating discharge: 39 minutes  Signed:  Yuepheng Schaller  [facility] 04/26/2023, 11:18 AM

## 2023-04-26 NOTE — Telephone Encounter (Signed)
Please advise. Patient is status post hip hemiarthroplasty on 04/19/2023.

## 2023-04-26 NOTE — TOC Transition Note (Signed)
Transition of Care [facility]) - CM/SW Discharge Note   Patient Details  Name: Alicia [name] MRN: [id number] Date of Birth: 1936/06/04  [facility] CM/SW Contact:  Palmer Shorey A [place], LCSWA Phone Number: 04/26/2023, 11:47 AM   Clinical Narrative:     Patient will DC to: [facility]  Anticipated DC date: 04/26/23  Family notified: Hildred Alamin    Transport by: Sharin Mons      Per MD patient ready for DC to [facility]. RN, patient, patient's family, and facility notified of DC. Discharge Summary and FL2 sent to facility. RN to call report prior to discharge ( Room 101B, [phone]). DC packet on chart. Ambulance transport requested for patient.     CSW will sign off for now as social work intervention is no longer needed. Please consult [place] again if new needs arise.   Auth ID: [id number] Reference ID:"[id number]  Final next level of care: Skilled Nursing Facility Barriers to Discharge: Barriers Resolved   Patient Goals and CMS Choice      Discharge Placement                Patient chooses bed at: [facility] Patient to be transferred to facility by: [facility] Name of family member notified: Hildred Alamin Patient and family notified of of transfer: 04/26/23  Discharge Plan and Services Additional resources added to the After Visit Summary for   In-house Referral: Clinical Social Work                                   Social Determinants of Health (SDOH) Interventions SDOH Screenings   Food Insecurity: Patient Unable To Answer (04/18/2023)  Transportation Needs: Patient Unable To Answer (04/18/2023)  Utilities: Patient Unable To Answer (04/18/2023)  Tobacco Use: Medium Risk (04/20/2023)     Readmission Risk Interventions     No data to display

## 2023-04-26 NOTE — Telephone Encounter (Signed)
Patients husband wants to speak to Dr. [name]. Please call

## 2023-04-27 DIAGNOSIS — G309 Alzheimer's disease, unspecified: Secondary | ICD-10-CM | POA: Diagnosis not present

## 2023-04-27 DIAGNOSIS — Z9181 History of falling: Secondary | ICD-10-CM | POA: Diagnosis not present

## 2023-04-27 DIAGNOSIS — K5900 Constipation, unspecified: Secondary | ICD-10-CM | POA: Diagnosis not present

## 2023-04-27 DIAGNOSIS — S72042D Displaced fracture of base of neck of left femur, subsequent encounter for closed fracture with routine healing: Secondary | ICD-10-CM | POA: Diagnosis not present

## 2023-04-27 DIAGNOSIS — R531 Weakness: Secondary | ICD-10-CM | POA: Diagnosis not present

## 2023-04-27 DIAGNOSIS — E785 Hyperlipidemia, unspecified: Secondary | ICD-10-CM | POA: Diagnosis not present

## 2023-04-27 DIAGNOSIS — G4700 Insomnia, unspecified: Secondary | ICD-10-CM | POA: Diagnosis not present

## 2023-04-27 DIAGNOSIS — R52 Pain, unspecified: Secondary | ICD-10-CM | POA: Diagnosis not present

## 2023-04-27 DIAGNOSIS — F32A Depression, unspecified: Secondary | ICD-10-CM | POA: Diagnosis not present

## 2023-04-28 DIAGNOSIS — E559 Vitamin D deficiency, unspecified: Secondary | ICD-10-CM | POA: Diagnosis not present

## 2023-04-28 DIAGNOSIS — E440 Moderate protein-calorie malnutrition: Secondary | ICD-10-CM | POA: Diagnosis not present

## 2023-04-28 DIAGNOSIS — G309 Alzheimer's disease, unspecified: Secondary | ICD-10-CM | POA: Diagnosis not present

## 2023-04-28 DIAGNOSIS — D509 Iron deficiency anemia, unspecified: Secondary | ICD-10-CM | POA: Diagnosis not present

## 2023-04-28 DIAGNOSIS — L89312 Pressure ulcer of right buttock, stage 2: Secondary | ICD-10-CM | POA: Diagnosis not present

## 2023-05-01 DIAGNOSIS — S72042D Displaced fracture of base of neck of left femur, subsequent encounter for closed fracture with routine healing: Secondary | ICD-10-CM | POA: Diagnosis not present

## 2023-05-01 DIAGNOSIS — Z9181 History of falling: Secondary | ICD-10-CM | POA: Diagnosis not present

## 2023-05-01 DIAGNOSIS — E559 Vitamin D deficiency, unspecified: Secondary | ICD-10-CM | POA: Diagnosis not present

## 2023-05-01 DIAGNOSIS — L89312 Pressure ulcer of right buttock, stage 2: Secondary | ICD-10-CM | POA: Diagnosis not present

## 2023-05-01 DIAGNOSIS — D509 Iron deficiency anemia, unspecified: Secondary | ICD-10-CM | POA: Diagnosis not present

## 2023-05-01 DIAGNOSIS — R531 Weakness: Secondary | ICD-10-CM | POA: Diagnosis not present

## 2023-05-01 DIAGNOSIS — E440 Moderate protein-calorie malnutrition: Secondary | ICD-10-CM | POA: Diagnosis not present

## 2023-05-02 ENCOUNTER — Telehealth: Payer: Self-pay | Admitting: Orthopaedic Surgery

## 2023-05-02 ENCOUNTER — Telehealth: Payer: Self-pay

## 2023-05-02 NOTE — Telephone Encounter (Signed)
Patients husband [name] called wanting to speak with Dr. [name].  Would like a call back?  Patient is status post hip hemiarthroplasty on 04/19/2023. CB# [phone].  Please advise.  Thank you.

## 2023-05-02 NOTE — Telephone Encounter (Signed)
Pt's husband [name] called stating he need help getting wife moved to another facility. He states it is very important and need a call from Dr. [name]. Please call ASAP at [phone]. He states pt she is not responding

## 2023-05-05 ENCOUNTER — Telehealth: Payer: Self-pay | Admitting: Orthopaedic Surgery

## 2023-05-05 NOTE — Telephone Encounter (Signed)
[phone] Tammy from [facility] called in to schedule a Post op  follow up for pt but next avail is not until 07/02 Pt had  ARTHROPLASTY BIPOLAR HIP (HEMIARTHROPLASTY) POSTERIOR on 05/232 and has not been seen in office yet, please advise

## 2023-05-06 DIAGNOSIS — R531 Weakness: Secondary | ICD-10-CM | POA: Diagnosis not present

## 2023-05-06 DIAGNOSIS — R5383 Other fatigue: Secondary | ICD-10-CM | POA: Diagnosis not present

## 2023-05-07 DIAGNOSIS — G309 Alzheimer's disease, unspecified: Secondary | ICD-10-CM | POA: Diagnosis not present

## 2023-05-07 DIAGNOSIS — S72042D Displaced fracture of base of neck of left femur, subsequent encounter for closed fracture with routine healing: Secondary | ICD-10-CM | POA: Diagnosis not present

## 2023-05-07 DIAGNOSIS — R5383 Other fatigue: Secondary | ICD-10-CM | POA: Diagnosis not present

## 2023-05-08 ENCOUNTER — Other Ambulatory Visit: Payer: Self-pay | Admitting: *Deleted

## 2023-05-08 DIAGNOSIS — I10 Essential (primary) hypertension: Secondary | ICD-10-CM | POA: Diagnosis not present

## 2023-05-08 DIAGNOSIS — D509 Iron deficiency anemia, unspecified: Secondary | ICD-10-CM | POA: Diagnosis not present

## 2023-05-08 NOTE — Patient Outreach (Signed)
Mrs. Lehr resides in [facility] skilled nursing facility.  Screening for potential care coordination services as a benefit of health plan and primary care provider.  PCP office [facility] has [facility] care management.   [facility] social worker previously reported anticipated transition plan is to return to [facility] pending progress.   [name], MSN, RN,BSN [facility] Post Acute Care Coordinator [phone] (Direct dial)

## 2023-05-09 DIAGNOSIS — E870 Hyperosmolality and hypernatremia: Secondary | ICD-10-CM | POA: Diagnosis not present

## 2023-05-09 DIAGNOSIS — R531 Weakness: Secondary | ICD-10-CM | POA: Diagnosis not present

## 2023-05-09 DIAGNOSIS — N182 Chronic kidney disease, stage 2 (mild): Secondary | ICD-10-CM | POA: Diagnosis not present

## 2023-05-09 DIAGNOSIS — G309 Alzheimer's disease, unspecified: Secondary | ICD-10-CM | POA: Diagnosis not present

## 2023-05-10 ENCOUNTER — Encounter: Payer: Self-pay | Admitting: Family

## 2023-05-10 ENCOUNTER — Ambulatory Visit (INDEPENDENT_AMBULATORY_CARE_PROVIDER_SITE_OTHER): Payer: Medicare Other | Admitting: Family

## 2023-05-10 ENCOUNTER — Other Ambulatory Visit (INDEPENDENT_AMBULATORY_CARE_PROVIDER_SITE_OTHER): Payer: Medicare Other

## 2023-05-10 DIAGNOSIS — S72002A Fracture of unspecified part of neck of left femur, initial encounter for closed fracture: Secondary | ICD-10-CM

## 2023-05-10 NOTE — Progress Notes (Signed)
Post-Op Visit Note   Patient: Alicia [name]           Date of Birth: [birth year]           MRN: [id number] Visit Date: 05/10/2023 PCP: [name], MD  Chief Complaint: No chief complaint on file.   HPI:  HPI The patient is an 87 year old woman with a history of Alzheimer's dementia who presents in follow-up for hip arthroplasty on the left May 22.  She is a patient of Dr. [name].  Today she is in a wheelchair for her mobility is unable to provide any history. Ortho Exam On examination of the left posterior hip the incision is well-healed staples in place these were harvested today without incident no gaping no drainage no erythema painless rotation of the hip  Visit Diagnoses: No diagnosis found.  Plan: She will continue with physical therapy.  Weightbearing as tolerated under therapy's discretion.  Follow-up with Dr. Kevan Ny in 2 weeks  Follow-Up Instructions: No follow-ups on file.   Imaging: No results found.  Orders:  No orders of the defined types were placed in this encounter.  No orders of the defined types were placed in this encounter.    PMFS History: Patient Active Problem List   Diagnosis Date Noted   Malnutrition of moderate degree 04/26/2023   Hyperlipidemia 04/20/2023   Depression 04/20/2023   Recurrent falls 04/20/2023   Constipation 04/20/2023   Hypokalemia 04/20/2023   Closed displaced fracture of left femoral neck (HCC) 04/19/2023   Fracture of femoral neck, left (HCC) 04/19/2023   Hip fracture (HCC) 04/17/2023   Syncope and collapse 12/10/2021   Orthostatic hypotension 12/10/2021   [name] 01/17/2019   Spinal stenosis, lumbar region, with neurogenic claudication 10/14/2013    Class: Diagnosis of   Past Medical History:  Diagnosis Date   [name] 01/17/2019   Anxiety    Arthritis    Frequent UTI    over the last year   Hypertension    Inguinal hernia    right   Shingles    Urinary frequency     over the year   Urinary urgency    Wears glasses     Family History  Problem Relation Age of Onset   High blood pressure Mother    Diabetes Sister    High blood pressure Sister    Breast cancer Sister    Colon cancer Neg Hx    Esophageal cancer Neg Hx     Past Surgical History:  Procedure Laterality Date   BUNIONECTOMY     both feet   EYE SURGERY Left 04-2013   cataract Left eye   HIP ARTHROPLASTY Left 04/19/2023   Procedure: ARTHROPLASTY BIPOLAR HIP (HEMIARTHROPLASTY) POSTERIOR;  Surgeon: Eldred Manges, MD;  Location: [facility] OR;  Service: Orthopedics;  Laterality: Left;   INGUINAL HERNIA REPAIR Right 09/16/2019   Procedure: LAPAROSCOPIC RIGHT INGUINAL HERNIA REPAIR WITH MESH;  Surgeon: Axel Filler, MD;  Location: [facility] OR;  Service: General;  Laterality: Right;   LUMBAR LAMINECTOMY N/A 10/14/2013   Procedure: MICRODISCECTOMY LUMBAR LAMINECTOMY/L4-5 Decompression, Excision Extradural Intraspinal Cyst;  Surgeon: Eldred Manges, MD;  Location: [facility] OR;  Service: Orthopedics;  Laterality: N/A;  L4-5 Decompression, Excision Extradural Intraspinal Cyst   Social History   Occupational History   Occupation: retired  Tobacco Use   Smoking status: Former    Types: Cigarettes   Smokeless tobacco: Never  Vaping Use   Vaping Use: Never used  Substance and  Sexual Activity   Alcohol use: Yes    Alcohol/week: 3.0 standard drinks of alcohol    Types: 3 Glasses of wine per week   Drug use: No   Sexual activity: Not Currently

## 2023-05-11 DIAGNOSIS — E860 Dehydration: Secondary | ICD-10-CM | POA: Diagnosis not present

## 2023-05-11 DIAGNOSIS — G309 Alzheimer's disease, unspecified: Secondary | ICD-10-CM | POA: Diagnosis not present

## 2023-05-11 DIAGNOSIS — I10 Essential (primary) hypertension: Secondary | ICD-10-CM | POA: Diagnosis not present

## 2023-05-11 DIAGNOSIS — R627 Adult failure to thrive: Secondary | ICD-10-CM | POA: Diagnosis not present

## 2023-05-11 DIAGNOSIS — R531 Weakness: Secondary | ICD-10-CM | POA: Diagnosis not present

## 2023-05-11 DIAGNOSIS — E870 Hyperosmolality and hypernatremia: Secondary | ICD-10-CM | POA: Diagnosis not present

## 2023-05-12 DIAGNOSIS — E870 Hyperosmolality and hypernatremia: Secondary | ICD-10-CM | POA: Diagnosis not present

## 2023-05-12 DIAGNOSIS — R627 Adult failure to thrive: Secondary | ICD-10-CM | POA: Diagnosis not present

## 2023-05-13 DIAGNOSIS — R627 Adult failure to thrive: Secondary | ICD-10-CM | POA: Diagnosis not present

## 2023-05-13 DIAGNOSIS — G309 Alzheimer's disease, unspecified: Secondary | ICD-10-CM | POA: Diagnosis not present

## 2023-05-13 DIAGNOSIS — R531 Weakness: Secondary | ICD-10-CM | POA: Diagnosis not present

## 2023-05-13 DIAGNOSIS — E870 Hyperosmolality and hypernatremia: Secondary | ICD-10-CM | POA: Diagnosis not present

## 2023-05-14 DIAGNOSIS — G309 Alzheimer's disease, unspecified: Secondary | ICD-10-CM | POA: Diagnosis not present

## 2023-05-14 DIAGNOSIS — R634 Abnormal weight loss: Secondary | ICD-10-CM | POA: Diagnosis not present

## 2023-05-14 DIAGNOSIS — E870 Hyperosmolality and hypernatremia: Secondary | ICD-10-CM | POA: Diagnosis not present

## 2023-05-14 DIAGNOSIS — R531 Weakness: Secondary | ICD-10-CM | POA: Diagnosis not present

## 2023-05-15 DIAGNOSIS — E440 Moderate protein-calorie malnutrition: Secondary | ICD-10-CM | POA: Diagnosis not present

## 2023-05-15 DIAGNOSIS — G309 Alzheimer's disease, unspecified: Secondary | ICD-10-CM | POA: Diagnosis not present

## 2023-05-15 DIAGNOSIS — R634 Abnormal weight loss: Secondary | ICD-10-CM | POA: Diagnosis not present

## 2023-05-15 DIAGNOSIS — E559 Vitamin D deficiency, unspecified: Secondary | ICD-10-CM | POA: Diagnosis not present

## 2023-05-15 DIAGNOSIS — D509 Iron deficiency anemia, unspecified: Secondary | ICD-10-CM | POA: Diagnosis not present

## 2023-05-15 DIAGNOSIS — L89312 Pressure ulcer of right buttock, stage 2: Secondary | ICD-10-CM | POA: Diagnosis not present

## 2023-05-15 DIAGNOSIS — R531 Weakness: Secondary | ICD-10-CM | POA: Diagnosis not present

## 2023-05-16 DIAGNOSIS — R5383 Other fatigue: Secondary | ICD-10-CM | POA: Diagnosis not present

## 2023-05-16 DIAGNOSIS — G309 Alzheimer's disease, unspecified: Secondary | ICD-10-CM | POA: Diagnosis not present

## 2023-05-16 DIAGNOSIS — R627 Adult failure to thrive: Secondary | ICD-10-CM | POA: Diagnosis not present

## 2023-05-17 DIAGNOSIS — R1310 Dysphagia, unspecified: Secondary | ICD-10-CM | POA: Diagnosis not present

## 2023-05-17 DIAGNOSIS — G309 Alzheimer's disease, unspecified: Secondary | ICD-10-CM | POA: Diagnosis not present

## 2023-05-17 DIAGNOSIS — R627 Adult failure to thrive: Secondary | ICD-10-CM | POA: Diagnosis not present

## 2023-05-17 DIAGNOSIS — R5383 Other fatigue: Secondary | ICD-10-CM | POA: Diagnosis not present

## 2023-05-18 ENCOUNTER — Other Ambulatory Visit: Payer: Self-pay | Admitting: *Deleted

## 2023-05-18 DIAGNOSIS — G309 Alzheimer's disease, unspecified: Secondary | ICD-10-CM | POA: Diagnosis not present

## 2023-05-18 NOTE — Patient Outreach (Signed)
Late entry for 05/21/2023  Alicia [name] resides in [facility] skilled nursing facility. Screening for potential care coordination services as benefit of health plan and PCP.  Transition plan is to transition to [facility] on Friday, June 21st.   No identifiable care coordination needs.    [name], MSN, RN,BSN [facility] Post Acute Care Coordinator [phone] (Direct dial)

## 2023-05-24 DIAGNOSIS — G309 Alzheimer's disease, unspecified: Secondary | ICD-10-CM | POA: Diagnosis not present

## 2023-05-29 DEATH — deceased

## 2023-05-30 ENCOUNTER — Ambulatory Visit: Payer: Medicare Other | Admitting: Orthopaedic Surgery

## 2023-11-21 IMAGING — DX DG CHEST 2V
3 series · 3 of 3 positions shown · non-contrast
Comparison: July 27, 2019

CLINICAL DATA: Tuberculosis screening.

EXAM:
CHEST - 2 VIEW

[dg chest 2 view (1 of 3)]
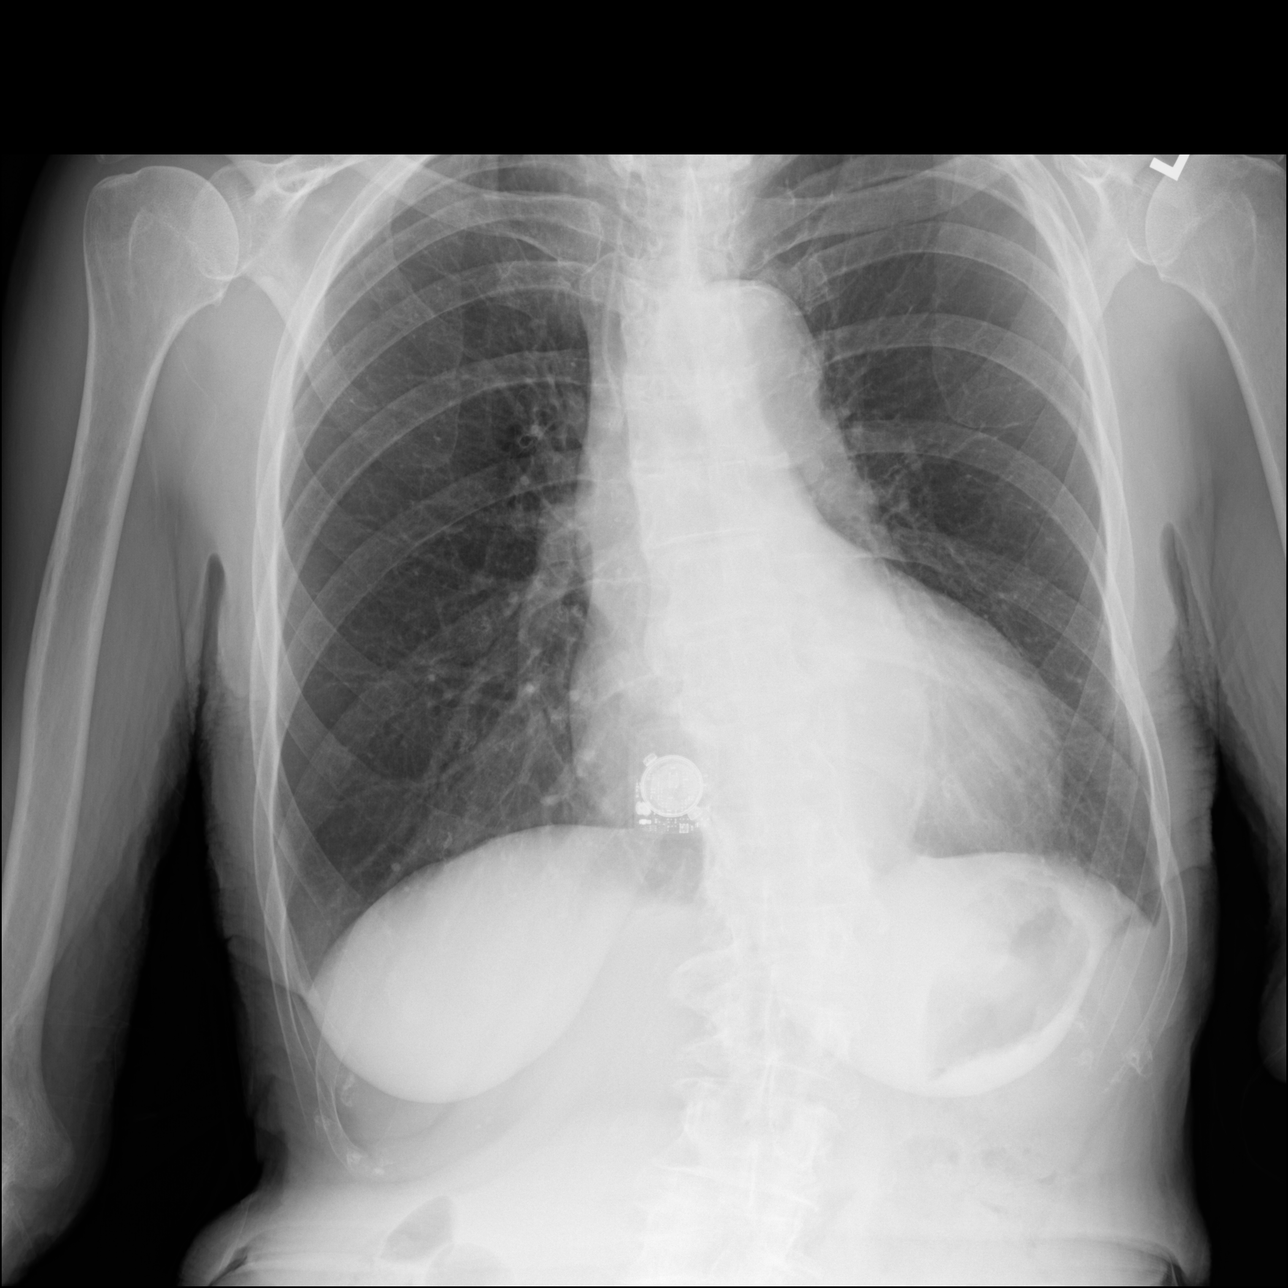

[dg chest 2 view (2 of 3)]
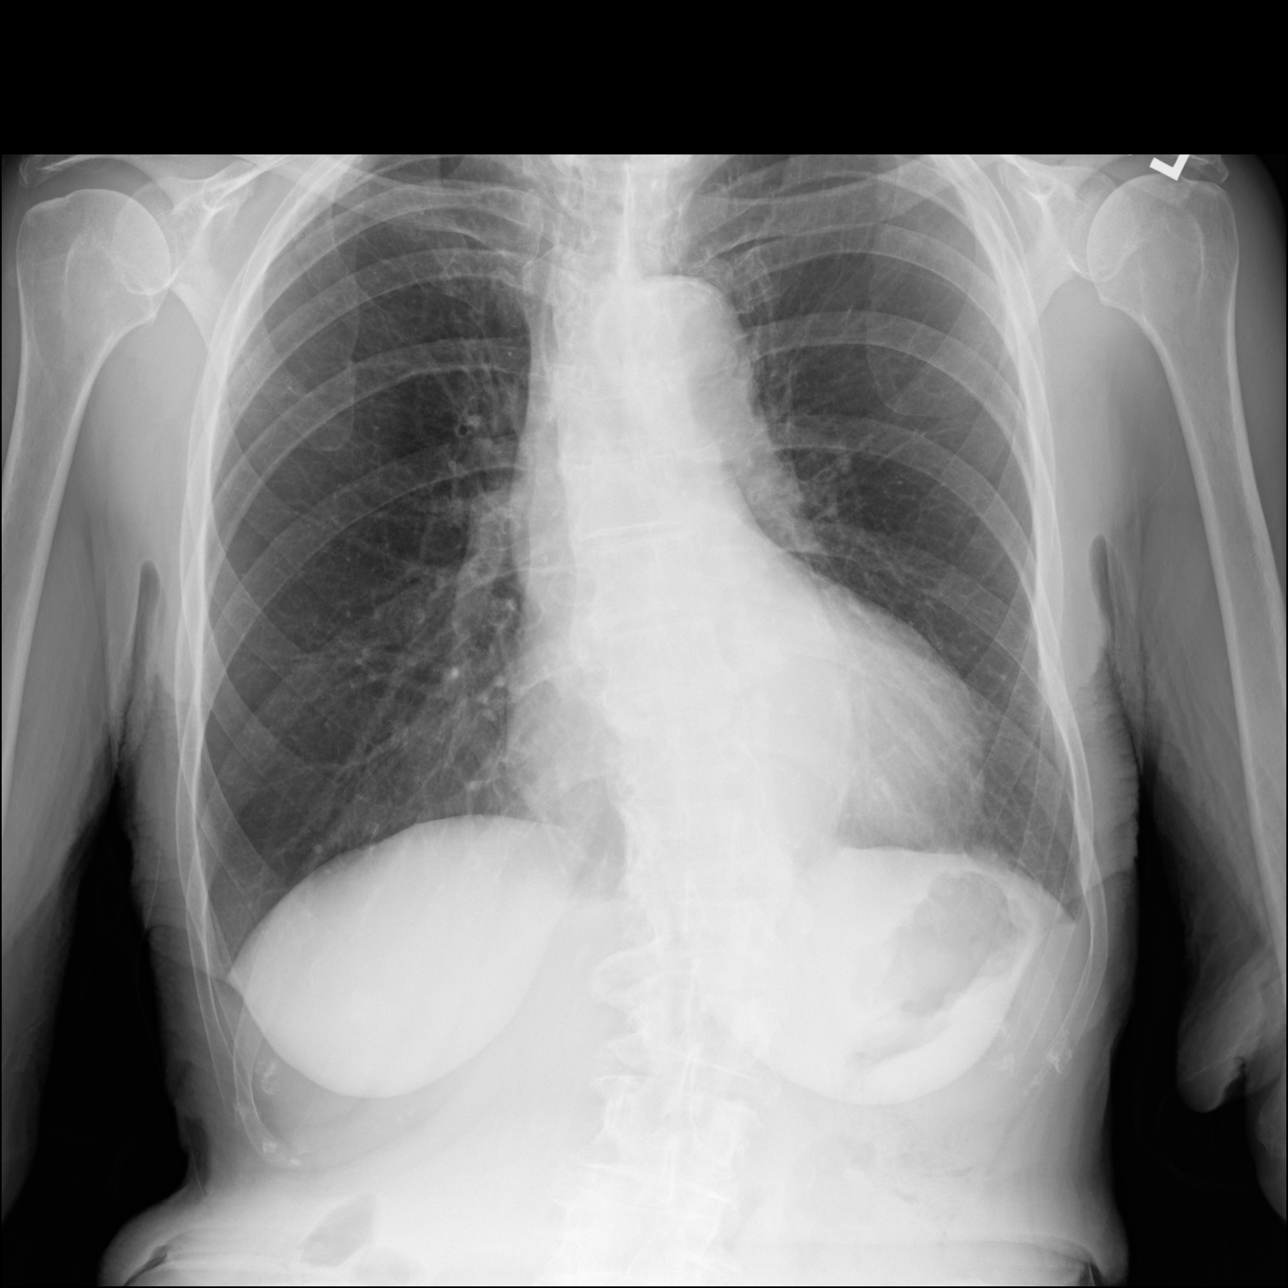

[dg chest 2 view (3 of 3)]
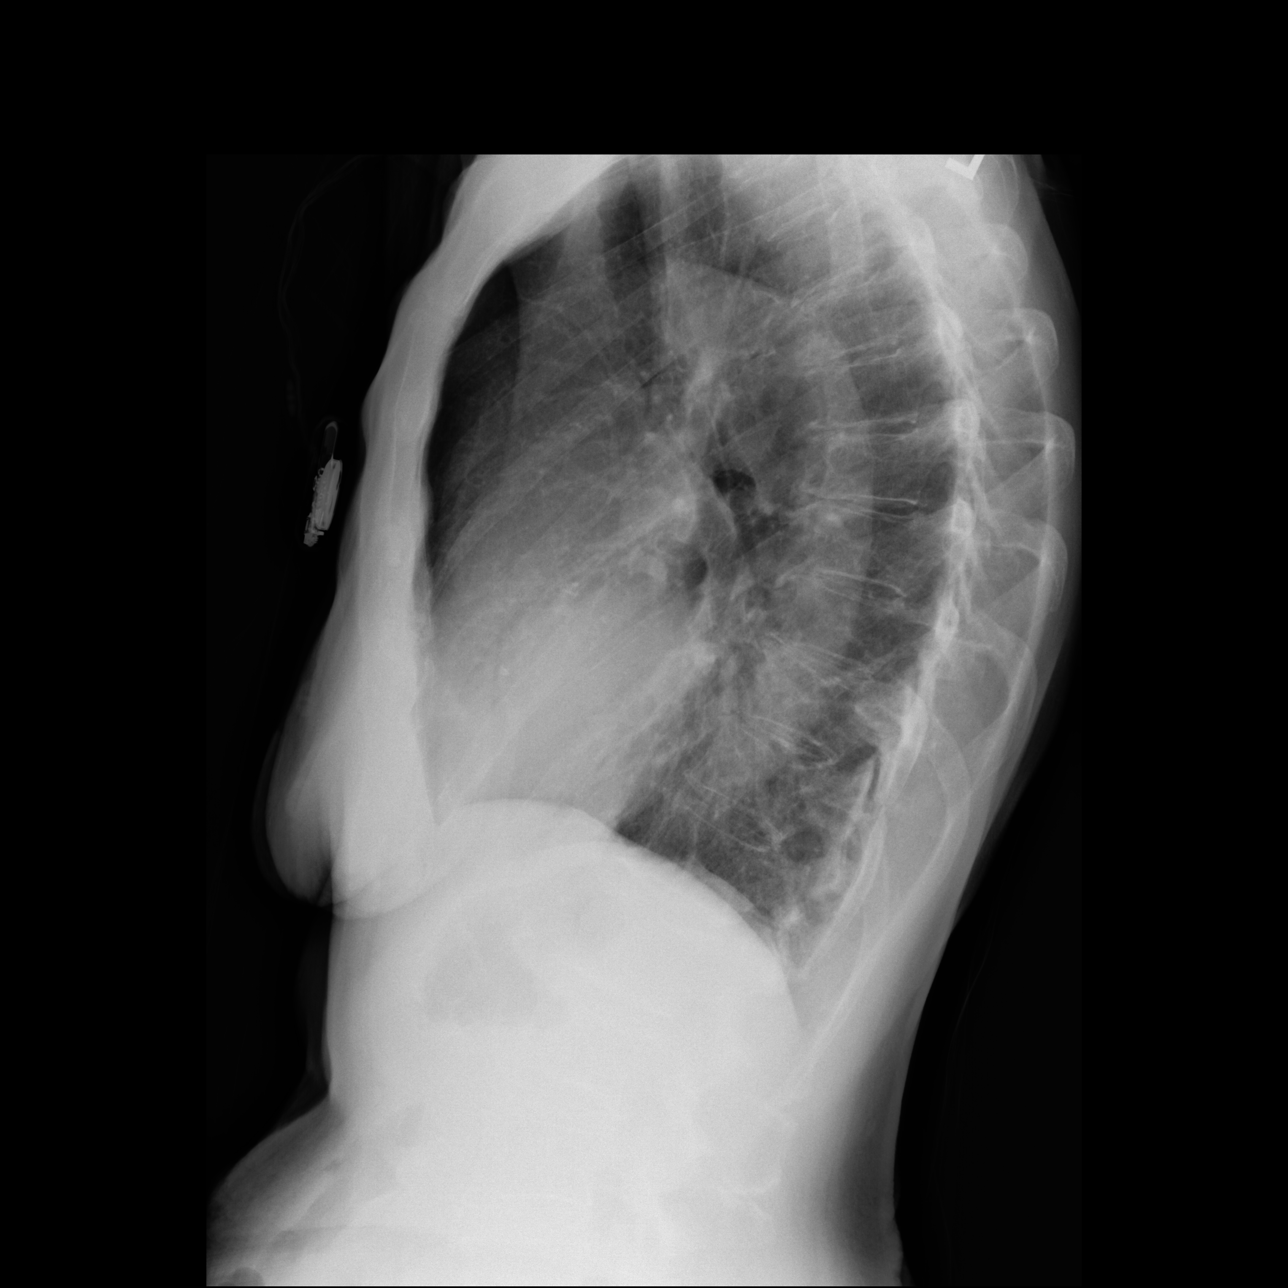

[3 of 3 positions shown; findings below may reference images not displayed]

FINDINGS: The heart size and mediastinal contours are stable. Both lungs are
clear. The visualized skeletal structures are stable. Scoliosis of
spine noted.
IMPRESSION: No active cardiopulmonary disease.
